# Patient Record
Sex: Female | Born: 1960 | Race: White | Hispanic: No | Marital: Married | Smoking: Former smoker
Health system: Southern US, Community
[De-identification: ages and names within clinical notes are randomized; demographics above are authoritative.]

## PROBLEM LIST (undated history)

## (undated) DIAGNOSIS — J449 Chronic obstructive pulmonary disease, unspecified: Secondary | ICD-10-CM

## (undated) DIAGNOSIS — E7800 Pure hypercholesterolemia, unspecified: Secondary | ICD-10-CM

## (undated) DIAGNOSIS — C801 Malignant (primary) neoplasm, unspecified: Secondary | ICD-10-CM

## (undated) DIAGNOSIS — K5792 Diverticulitis of intestine, part unspecified, without perforation or abscess without bleeding: Secondary | ICD-10-CM

## (undated) DIAGNOSIS — Z9889 Other specified postprocedural states: Secondary | ICD-10-CM

## (undated) DIAGNOSIS — F32A Depression, unspecified: Secondary | ICD-10-CM

## (undated) DIAGNOSIS — M5136 Other intervertebral disc degeneration, lumbar region: Secondary | ICD-10-CM

## (undated) DIAGNOSIS — R112 Nausea with vomiting, unspecified: Secondary | ICD-10-CM

## (undated) DIAGNOSIS — Z9981 Dependence on supplemental oxygen: Secondary | ICD-10-CM

## (undated) DIAGNOSIS — F419 Anxiety disorder, unspecified: Secondary | ICD-10-CM

## (undated) DIAGNOSIS — R011 Cardiac murmur, unspecified: Secondary | ICD-10-CM

## (undated) DIAGNOSIS — K219 Gastro-esophageal reflux disease without esophagitis: Secondary | ICD-10-CM

## (undated) DIAGNOSIS — I10 Essential (primary) hypertension: Secondary | ICD-10-CM

## (undated) DIAGNOSIS — M51369 Other intervertebral disc degeneration, lumbar region without mention of lumbar back pain or lower extremity pain: Secondary | ICD-10-CM

## (undated) DIAGNOSIS — Z8701 Personal history of pneumonia (recurrent): Secondary | ICD-10-CM

## (undated) HISTORY — PX: ABDOMINAL HYSTERECTOMY: SHX81

---

## 2008-01-14 ENCOUNTER — Inpatient Hospital Stay (HOSPITAL_COMMUNITY): Admission: EM | Admit: 2008-01-14 | Discharge: 2008-01-16 | Payer: Self-pay | Admitting: Emergency Medicine

## 2010-12-05 ENCOUNTER — Other Ambulatory Visit (HOSPITAL_COMMUNITY): Payer: Self-pay | Admitting: Family Medicine

## 2010-12-05 DIAGNOSIS — R0989 Other specified symptoms and signs involving the circulatory and respiratory systems: Secondary | ICD-10-CM

## 2010-12-08 ENCOUNTER — Ambulatory Visit (HOSPITAL_COMMUNITY)
Admission: RE | Admit: 2010-12-08 | Discharge: 2010-12-08 | Disposition: A | Discharge status: Home / Self Care | Payer: Self-pay | Source: Ambulatory Visit | Attending: Family Medicine | Admitting: Family Medicine

## 2010-12-08 DIAGNOSIS — R109 Unspecified abdominal pain: Secondary | ICD-10-CM | POA: Insufficient documentation

## 2010-12-08 DIAGNOSIS — R0989 Other specified symptoms and signs involving the circulatory and respiratory systems: Secondary | ICD-10-CM | POA: Insufficient documentation

## 2011-01-02 NOTE — H&P (Signed)
NAMEEVELY, GAINEY               ACCOUNT NO.:  [id number]   MEDICAL RECORD NO.:  [id number]          PATIENT TYPE:  INP   LOCATION:  A337                          FACILITY:  [facility]   PHYSICIAN:  Margaretmary Dys, M.D.DATE OF BIRTH:  July 25, 1961   DATE OF ADMISSION:  01/14/2008  DATE OF DISCHARGE:  LH                              HISTORY & PHYSICAL   PRIMARY CARE PHYSICIAN:  Dr. Freida Busman [facility].   ADMISSION DIAGNOSES:  1. Acute exacerbation of chronic obstructive pulmonary disease.  2. Prior undiagnosed emphysema/chronic obstructive pulmonary disease.  3. Hypokalemia.  4. Nicotine abuse with up 2 packs a day for an approximate 80 pack      year history.   CHIEF COMPLAINT:  Increasing cough and shortness of breath of 2 days  duration.   HISTORY OF PRESENT ILLNESS:  Kathleen Bennett is a 50 year old female who  presented to the emergency room today with complaint of cough.  The  patient reports some cough productive of yellowish sputum and occasional  greenish sputum.  She says she may have had some low-grade fever,  although she did not really check.  The patient says she has had  difficulty getting up to work because of shortness of breath and her flu-  like symptoms which she describes.  The patient also says she has been  having a lot of wheezing.  She reports some pain mostly pruritic chest  pain which is 3/10.  Apparently may be aggravated by severe cough.   The patient was told in the past that she may have chronic obstructive  pulmonary disease, but has never really been treated for it.  She has  had occasional nebulization treatments in the emergency room, but has  not had any outpatient therapy.  Evaluation here in the emergency room,  the patient was noted to be wheezing and had rhonchi.  She was also  hypoxic with desaturations low at 79% while she was walking.  Due to  hypoxia, the patient is now being admitted for further evaluation and  management for  what appears to be acute exacerbation of undiagnosed and  untreated COPD.   REVIEW OF SYSTEMS:  She denies any weight loss.  She has no hemoptysis.  No nausea or vomiting.  No diarrhea.  No abdominal pain.  She does have  generalized muscle aches.   PAST MEDICAL HISTORY:  1. Asthma.  2. Hypertension.  3. Allergies.  4. History of recurrent bronchitis in the past.  5. History of depression.  6. History of anxiety.   PAST SURGICAL HISTORY:  Status post hysterectomy.   MEDICATIONS:  1. Prozac 20 mg p.o. once a day.  2. Atenolol 25 mg p.o. once a day.  3. Buspirone 10 mg p.o. t.i.d.  4. Loratadine 10 mg p.o. once a day.  5. Ceron DM syrup 1 teaspoonful 4 times a day.   ALLERGIES:  CODEINE with itching.   SOCIAL HISTORY:  The patient is married and lives with her husband.  They have 3 children.  She continues to smoke about 2 packs of  cigarettes a day  for the past 40 years.  She denies any illicit drug  use.  The patient currently works at the [employer] at [employer].  The patient drinks occasionally.  Denies any dependency  issues.   FAMILY HISTORY:  Positive for hypertension, diabetes, coronary artery  disease.   PHYSICAL EXAMINATION:  GENERAL:  The patient was conscious, alert,  comfortable, although in some mild respiratory distress and having  occasional bouts of cough.  VITAL SIGNS:  Blood pressure 118/78, pulse 85, respirations 20,  temperature 100.4 degrees Fahrenheit.  Oxygen saturation was 93% on room  air.  HEENT:  Normocephalic, atraumatic.  Oral mucosa was moist with no  exudates.  NECK:  Supple.  No JVD or lymphadenopathy.  LUNGS:  Bilaterally diffuse rhonchi and wheezing.  HEART:  S1 and S2 regular.  No S3, S4, gallops or rubs.  ABDOMEN:  Soft, nontender.  Bowel sounds were positive.  No mass was  palpable.  EXTREMITIES:  No pitting pedal edema.  No calf induration or tenderness  was noted.  CNS:  Grossly intact with no focal  neurological deficits.   LABORATORY DATA:  Arterial blood gas shows a pH of 7.480, PCO2 was  steady at 0.6, PO2 was 54.2 with bicarbonate of 28.4, oxygen saturation  was 87%.  This was on 2 liters nasal cannula.  White blood cell count  was 11.5, hemoglobin 13.7, hematocrit 38, platelet count 199,  neutrophils of 82%, sodium 132, potassium 2.9, chloride 95, CO2 29,  glucose 141, BUN 12, creatinine 0.79, AST 33, ALT 23, albumin 3.6,  calcium 9.3.  Urinalysis only showed a small amount of blood.   DIAGNOSTICS:  Chest x-ray showed evidence of COPD and chronic bronchitic  changes.   ASSESSMENT:  This is a 50 year old female who presented with increasing  cough, shortness of breath and increased sputum production over the last  2-3 days.  The patient has what appears to be undiagnosed chronic  obstructive pulmonary disease in light of previous symptoms and heavy  use of nicotine.   IMPRESSION:  1. Hypoxic respiratory failure.  2. Acute chronic obstructive pulmonary disease exacerbation.  3. History of anxiety and depression.  4. Moderate hypokalemia.   PLAN:  1. We will admit patient to the medical floor.  2. We will initiate therapy with levofloxacin, Solu-Medrol and also      nebulizations around the clock.  3. The patient may require BiPAP therapy depending on her response,      but she does appear to mostly just be hypoxic and she did have      significant improvement when she was placed on oxygen therapy.  4. I will replace her potassium in addition to what has been done in      the emergency room.  5.  DVT prophylaxis will be with Lovenox.  5. GI prophylaxis will be Protonix.  6. I have informed patient that she will need to establish care with      the pulmonologist for further evaluation of her COPD.  7. She will also need pulmonary function tests.  8. I will resume all her prior home medications at this time.   CODE STATUS:  The patient is a full code.  I have informed her  the  potential for her symptoms to worsen and that she might need mechanical  ventilation.   I have explained the above and she verbalized full understanding.  There  were no family members at this time to discuss her  current status.      Margaretmary Dys, M.D.  Electronically Signed     AM/MEDQ  D:  01/14/2008  T:  01/14/2008  Job:  [id number]

## 2011-01-02 NOTE — Discharge Summary (Signed)
NAMESHYASIA, FUNCHES               ACCOUNT NO.:  [id number]   MEDICAL RECORD NO.:  [id number]          PATIENT TYPE:  INP   LOCATION:  A337                          FACILITY:  [facility]   PHYSICIAN:  Kathleen Singh, Kathleen Bennett    DATE OF BIRTH:  02/17/1961   DATE OF ADMISSION:  01/13/2008  DATE OF DISCHARGE:  05/29/2009LH                               DISCHARGE SUMMARY   her   ADMISSION DIAGNOSES:  1. Acute exacerbation of chronic obstructive pulmonary disease.  2. Prior undiagnosed emphysema of chronic obstructive pulmonary      disease.  3. Hypokalemia.  4. Nicotine use, with up to 2 packs a day for an approximately 80 pack-      year history.   DISCHARGING DIAGNOSES:  1. Acute exacerbation of chronic obstructive pulmonary disease,      resolved.  2. Hypokalemia, resolved.  3. Nicotine abuse.   PRIMARY CARE PHYSICIAN:  Kathleen Bennett. Kathleen Bennett  is her physician.   H&P was done by Kathleen Bennett.  Please refer.  To summarize, the patient  is a 49 year old female who presented with a history of shortness of  breath and was diagnosed with new-onset COPD.  She was admitted the  service of [facility].  She was started on levofloxacin, Solu-Medrol, as  well as nebulizers around the clock.  They replaced her potassium and  did DVT and GI prophylaxis.  The patient was educated completely on her  COPD.  We have set her up with home healthcare for a home nebulizer as  well as home O2.  She was assessed for her oxygenation level and found  to qualify for continuous home oxygen, so we will place her on that.  While she was here, we went ahead and talked with her about establishing  with a pulmonologist for further evaluation.  The patient stated  understanding.  She also has stated that she will try to stop smoking at  this point in time.   She will be sent home on her following medications that she was on  previously:  1. Atenolol/chlorothiazide 100/25, 1 p.o. daily.  2.  Fluoxetine 20 mg 1 p.o. at bedtime.  3. Buspirone 10 mg t.i.d. p.r.n.  4. Ceron-DM cough suppressant q.i.d. p.r.n.  5. Loratadine 10 mg daily.   NEW PRESCRIPTIONS:  1. Xopenex 1.25 mg nebulizers q.i.d. p.r.n.  2. Prednisone 10 mg, with specialized dosing of 60 mg x2 days, 50 mg      x2 days, 40 mg x2 days, 30 mg x2 days, 20 mg x2 days, and 10 mg x2      days, then stop.  3. We will also send her home on is a Z-PAK, and to use as directed.   There was some concern as to whether or not the patient can afford to  pay for her prescriptions, so we will have case management talk to her  and see what the health Bennett can Kathleen Bennett for her as well as what our  services can offer her.   CONDITION:  Stable.   DISPOSITION:  Will be to home.  She has a appointment with Kathleen Bennett office on January 24, 2008 to follow  up regarding any out of work issues and for hospital followup at 8 a.m.  Also, we will set her up an appointment with Dr. Juanetta Gosling' office in a  couple weeks so she can follow up with him if her primary care physician  feels that it is appropriate.  She has been told return if symptoms  worsen, and to definitely stop smoking.      Kathleen Singh, Kathleen Bennett  Electronically Signed     CB/MEDQ  D:  01/16/2008  T:  01/16/2008  Job:  [id number]   cc:   [facility]  Fax: [phone]

## 2011-05-16 LAB — COMPREHENSIVE METABOLIC PANEL
AST: 33
Albumin: 3.6
Alkaline Phosphatase: 49
Chloride: 95 — ABNORMAL LOW
Creatinine, Ser: 0.79
GFR calc Af Amer: >60
Potassium: 2.9 — ABNORMAL LOW
Total Bilirubin: 0.6

## 2011-05-16 LAB — BLOOD GAS, ARTERIAL
Acid-Base Excess: 1.8
Acid-Base Excess: 4.9 — ABNORMAL HIGH
O2 Content: 2
O2 Saturation: 87.1
Patient temperature: 37
TCO2: 22.6
TCO2: 24.7
pCO2 arterial: 37.4
pH, Arterial: 7.448 — ABNORMAL HIGH
pO2, Arterial: 62.8 — ABNORMAL LOW

## 2011-05-16 LAB — URINALYSIS, ROUTINE W REFLEX MICROSCOPIC
Bilirubin Urine: NEGATIVE
Glucose, UA: NEGATIVE
Ketones, ur: NEGATIVE
Nitrite: NEGATIVE
pH: 7

## 2011-05-16 LAB — DIFFERENTIAL
Basophils Absolute: 0
Basophils Absolute: 0
Basophils Relative: 0
Eosinophils Absolute: 0
Eosinophils Relative: 0
Eosinophils Relative: 0
Lymphocytes Relative: 10 — ABNORMAL LOW
Lymphocytes Relative: 9 — ABNORMAL LOW
Lymphs Abs: 1.5
Monocytes Absolute: 0.3
Monocytes Absolute: 1.1 — ABNORMAL HIGH
Monocytes Relative: 2 — ABNORMAL LOW
Monocytes Relative: 9
Neutro Abs: 12.6 — ABNORMAL HIGH
Neutrophils Relative %: 88 — ABNORMAL HIGH

## 2011-05-16 LAB — CBC
HCT: 37
Platelets: 199
Platelets: 244
RDW: 14.5
WBC: 11.5 — ABNORMAL HIGH
WBC: 14.4 — ABNORMAL HIGH

## 2011-05-16 LAB — URINE CULTURE
Colony Count: NO GROWTH
Culture: NO GROWTH

## 2011-05-16 LAB — BASIC METABOLIC PANEL WITH GFR
BUN: 12
CO2: 27
Calcium: 9.5
Chloride: 100
Creatinine, Ser: 0.81
GFR calc non Af Amer: >60
Glucose, Bld: 270 — ABNORMAL HIGH
Potassium: 3.9
Sodium: 139

## 2012-04-04 ENCOUNTER — Encounter (HOSPITAL_COMMUNITY): Payer: Self-pay

## 2012-04-04 ENCOUNTER — Emergency Department (HOSPITAL_COMMUNITY): Payer: No Typology Code available for payment source

## 2012-04-04 ENCOUNTER — Emergency Department (HOSPITAL_COMMUNITY)
Admission: EM | Admit: 2012-04-04 | Discharge: 2012-04-04 | Disposition: A | Discharge status: Home / Self Care | Payer: No Typology Code available for payment source | Attending: Emergency Medicine | Admitting: Emergency Medicine

## 2012-04-04 DIAGNOSIS — J449 Chronic obstructive pulmonary disease, unspecified: Secondary | ICD-10-CM | POA: Insufficient documentation

## 2012-04-04 DIAGNOSIS — S0990XA Unspecified injury of head, initial encounter: Secondary | ICD-10-CM | POA: Insufficient documentation

## 2012-04-04 DIAGNOSIS — S8010XA Contusion of unspecified lower leg, initial encounter: Secondary | ICD-10-CM | POA: Insufficient documentation

## 2012-04-04 DIAGNOSIS — F411 Generalized anxiety disorder: Secondary | ICD-10-CM | POA: Insufficient documentation

## 2012-04-04 DIAGNOSIS — R079 Chest pain, unspecified: Secondary | ICD-10-CM | POA: Insufficient documentation

## 2012-04-04 DIAGNOSIS — I10 Essential (primary) hypertension: Secondary | ICD-10-CM | POA: Insufficient documentation

## 2012-04-04 DIAGNOSIS — J4489 Other specified chronic obstructive pulmonary disease: Secondary | ICD-10-CM | POA: Insufficient documentation

## 2012-04-04 DIAGNOSIS — K219 Gastro-esophageal reflux disease without esophagitis: Secondary | ICD-10-CM | POA: Insufficient documentation

## 2012-04-04 DIAGNOSIS — M542 Cervicalgia: Secondary | ICD-10-CM | POA: Insufficient documentation

## 2012-04-04 DIAGNOSIS — F17200 Nicotine dependence, unspecified, uncomplicated: Secondary | ICD-10-CM | POA: Insufficient documentation

## 2012-04-04 DIAGNOSIS — IMO0002 Reserved for concepts with insufficient information to code with codable children: Secondary | ICD-10-CM | POA: Insufficient documentation

## 2012-04-04 DIAGNOSIS — S20219A Contusion of unspecified front wall of thorax, initial encounter: Secondary | ICD-10-CM | POA: Insufficient documentation

## 2012-04-04 DIAGNOSIS — S8012XA Contusion of left lower leg, initial encounter: Secondary | ICD-10-CM

## 2012-04-04 DIAGNOSIS — M79609 Pain in unspecified limb: Secondary | ICD-10-CM | POA: Insufficient documentation

## 2012-04-04 HISTORY — DX: Anxiety disorder, unspecified: F41.9

## 2012-04-04 HISTORY — DX: Essential (primary) hypertension: I10

## 2012-04-04 HISTORY — DX: Chronic obstructive pulmonary disease, unspecified: J44.9

## 2012-04-04 HISTORY — DX: Gastro-esophageal reflux disease without esophagitis: K21.9

## 2012-04-04 MED ORDER — HYDROCODONE-ACETAMINOPHEN 5-325 MG PO TABS: 1.0000 | ORAL_TABLET | ORAL | Status: AC | PRN

## 2012-04-04 MED ORDER — HYDROCODONE-ACETAMINOPHEN 5-325 MG PO TABS
1.0000 | ORAL_TABLET | Freq: Once | ORAL | Status: AC
  Administered 2012-04-04: 1 via ORAL
  Filled 2012-04-04: qty 1
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
## 2012-04-04 NOTE — ED Notes (Signed)
Pt aware of xrays

## 2013-11-21 ENCOUNTER — Emergency Department (HOSPITAL_COMMUNITY): Payer: Medicaid Other

## 2013-11-21 ENCOUNTER — Encounter (HOSPITAL_COMMUNITY): Payer: Self-pay | Admitting: Emergency Medicine

## 2013-11-21 ENCOUNTER — Inpatient Hospital Stay (HOSPITAL_COMMUNITY)
Admission: EM | Admit: 2013-11-21 | Discharge: 2013-11-24 | DRG: 392 | Disposition: A | Discharge status: Home / Self Care | Payer: Medicaid Other | Attending: Internal Medicine | Admitting: Internal Medicine

## 2013-11-21 DIAGNOSIS — K5792 Diverticulitis of intestine, part unspecified, without perforation or abscess without bleeding: Secondary | ICD-10-CM

## 2013-11-21 DIAGNOSIS — F411 Generalized anxiety disorder: Secondary | ICD-10-CM | POA: Diagnosis present

## 2013-11-21 DIAGNOSIS — E860 Dehydration: Secondary | ICD-10-CM | POA: Diagnosis present

## 2013-11-21 DIAGNOSIS — I10 Essential (primary) hypertension: Secondary | ICD-10-CM | POA: Diagnosis present

## 2013-11-21 DIAGNOSIS — K529 Noninfective gastroenteritis and colitis, unspecified: Secondary | ICD-10-CM

## 2013-11-21 DIAGNOSIS — K5732 Diverticulitis of large intestine without perforation or abscess without bleeding: Principal | ICD-10-CM

## 2013-11-21 DIAGNOSIS — J4489 Other specified chronic obstructive pulmonary disease: Secondary | ICD-10-CM | POA: Diagnosis present

## 2013-11-21 DIAGNOSIS — E876 Hypokalemia: Secondary | ICD-10-CM | POA: Diagnosis present

## 2013-11-21 DIAGNOSIS — D72829 Elevated white blood cell count, unspecified: Secondary | ICD-10-CM | POA: Diagnosis present

## 2013-11-21 DIAGNOSIS — J449 Chronic obstructive pulmonary disease, unspecified: Secondary | ICD-10-CM | POA: Diagnosis present

## 2013-11-21 DIAGNOSIS — N179 Acute kidney failure, unspecified: Secondary | ICD-10-CM | POA: Diagnosis present

## 2013-11-21 DIAGNOSIS — E7800 Pure hypercholesterolemia, unspecified: Secondary | ICD-10-CM | POA: Diagnosis present

## 2013-11-21 DIAGNOSIS — K219 Gastro-esophageal reflux disease without esophagitis: Secondary | ICD-10-CM | POA: Diagnosis present

## 2013-11-21 DIAGNOSIS — M5137 Other intervertebral disc degeneration, lumbosacral region: Secondary | ICD-10-CM | POA: Diagnosis present

## 2013-11-21 DIAGNOSIS — M51379 Other intervertebral disc degeneration, lumbosacral region without mention of lumbar back pain or lower extremity pain: Secondary | ICD-10-CM | POA: Diagnosis present

## 2013-11-21 DIAGNOSIS — F17200 Nicotine dependence, unspecified, uncomplicated: Secondary | ICD-10-CM | POA: Diagnosis present

## 2013-11-21 DIAGNOSIS — Z23 Encounter for immunization: Secondary | ICD-10-CM

## 2013-11-21 HISTORY — DX: Other intervertebral disc degeneration, lumbar region without mention of lumbar back pain or lower extremity pain: M51.369

## 2013-11-21 HISTORY — DX: Pure hypercholesterolemia, unspecified: E78.00

## 2013-11-21 HISTORY — DX: Other intervertebral disc degeneration, lumbar region: M51.36

## 2013-11-21 LAB — MAGNESIUM: Magnesium: 1.9 mg/dL (ref 1.5–2.5)

## 2013-11-21 LAB — COMPREHENSIVE METABOLIC PANEL
ALBUMIN: 4.1 g/dL (ref 3.5–5.2)
ALK PHOS: 83 U/L (ref 39–117)
ALT: 11 U/L (ref 0–35)
AST: 15 U/L (ref 0–37)
BUN: 12 mg/dL (ref 6–23)
CO2: 30 mEq/L (ref 19–32)
Calcium: 10.6 mg/dL — ABNORMAL HIGH (ref 8.4–10.5)
Chloride: 87 mEq/L — ABNORMAL LOW (ref 96–112)
Creatinine, Ser: 1.23 mg/dL — ABNORMAL HIGH (ref 0.50–1.10)
GFR calc Af Amer: 57 mL/min — ABNORMAL LOW (ref >90)
GFR calc non Af Amer: 49 mL/min — ABNORMAL LOW (ref >90)
Glucose, Bld: 143 mg/dL — ABNORMAL HIGH (ref 70–99)
POTASSIUM: 2.9 meq/L — AB (ref 3.7–5.3)
Sodium: 134 mEq/L — ABNORMAL LOW (ref 137–147)
Total Bilirubin: 1.1 mg/dL (ref 0.3–1.2)
Total Protein: 8.9 g/dL — ABNORMAL HIGH (ref 6.0–8.3)

## 2013-11-21 LAB — CBC WITH DIFFERENTIAL/PLATELET
BASOS PCT: 0 % (ref 0–1)
Basophils Absolute: 0 10*3/uL (ref 0.0–0.1)
Eosinophils Absolute: 0 10*3/uL (ref 0.0–0.7)
Eosinophils Relative: 0 % (ref 0–5)
HCT: 46.2 % — ABNORMAL HIGH (ref 36.0–46.0)
HEMOGLOBIN: 16.1 g/dL — AB (ref 12.0–15.0)
LYMPHS ABS: 1.3 10*3/uL (ref 0.7–4.0)
Lymphocytes Relative: 9 % — ABNORMAL LOW (ref 12–46)
MCH: 35.2 pg — ABNORMAL HIGH (ref 26.0–34.0)
MCHC: 34.8 g/dL (ref 30.0–36.0)
MCV: 100.9 fL — ABNORMAL HIGH (ref 78.0–100.0)
Monocytes Absolute: 0.8 10*3/uL (ref 0.1–1.0)
Monocytes Relative: 6 % (ref 3–12)
NEUTROS ABS: 12 10*3/uL — AB (ref 1.7–7.7)
NEUTROS PCT: 85 % — AB (ref 43–77)
Platelets: 202 10*3/uL (ref 150–400)
RBC: 4.58 MIL/uL (ref 3.87–5.11)
RDW: 13.4 % (ref 11.5–15.5)
WBC: 14 10*3/uL — ABNORMAL HIGH (ref 4.0–10.5)

## 2013-11-21 LAB — URINE MICROSCOPIC-ADD ON

## 2013-11-21 LAB — URINALYSIS, ROUTINE W REFLEX MICROSCOPIC
GLUCOSE, UA: 100 mg/dL — AB
Leukocytes, UA: NEGATIVE
Nitrite: NEGATIVE
PH: 7 (ref 5.0–8.0)
Protein, ur: 100 mg/dL — AB
SPECIFIC GRAVITY, URINE: 1.01 (ref 1.005–1.030)
Urobilinogen, UA: 4 mg/dL — ABNORMAL HIGH (ref 0.0–1.0)

## 2013-11-21 LAB — LIPASE, BLOOD: Lipase: 17 U/L (ref 11–59)

## 2013-11-21 MED ORDER — POTASSIUM CHLORIDE 10 MEQ/100ML IV SOLN
10.0000 meq | Freq: Once | INTRAVENOUS | Status: AC
  Administered 2013-11-21: 10 meq via INTRAVENOUS
  Filled 2013-11-21: qty 100

## 2013-11-21 MED ORDER — ATENOLOL-CHLORTHALIDONE 100-25 MG PO TABS: 1.0000 | ORAL_TABLET | ORAL | Status: DC

## 2013-11-21 MED ORDER — ALBUTEROL SULFATE HFA 108 (90 BASE) MCG/ACT IN AERS: 2.0000 | INHALATION_SPRAY | Freq: Four times a day (QID) | RESPIRATORY_TRACT | Status: DC | PRN

## 2013-11-21 MED ORDER — BUSPIRONE HCL 5 MG PO TABS
10.0000 mg | ORAL_TABLET | Freq: Two times a day (BID) | ORAL | Status: DC
  Administered 2013-11-21 – 2013-11-23 (×5): 10 mg via ORAL
  Filled 2013-11-21 (×5): qty 2

## 2013-11-21 MED ORDER — CHLORTHALIDONE 25 MG PO TABS
25.0000 mg | ORAL_TABLET | Freq: Every day | ORAL | Status: DC
  Administered 2013-11-22: 25 mg via ORAL
  Filled 2013-11-21 (×2): qty 1

## 2013-11-21 MED ORDER — MORPHINE SULFATE 2 MG/ML IJ SOLN: 1.0000 mg | INTRAMUSCULAR | Status: DC | PRN

## 2013-11-21 MED ORDER — ENOXAPARIN SODIUM 40 MG/0.4ML ~~LOC~~ SOLN
40.0000 mg | SUBCUTANEOUS | Status: DC
  Administered 2013-11-21 – 2013-11-23 (×3): 40 mg via SUBCUTANEOUS
  Filled 2013-11-21 (×3): qty 0.4

## 2013-11-21 MED ORDER — METRONIDAZOLE IN NACL 5-0.79 MG/ML-% IV SOLN
500.0000 mg | Freq: Once | INTRAVENOUS | Status: AC
  Administered 2013-11-21: 500 mg via INTRAVENOUS
  Filled 2013-11-21: qty 100

## 2013-11-21 MED ORDER — IOHEXOL 300 MG/ML  SOLN
50.0000 mL | Freq: Once | INTRAMUSCULAR | Status: AC | PRN
  Administered 2013-11-21: 50 mL via ORAL

## 2013-11-21 MED ORDER — METRONIDAZOLE IN NACL 5-0.79 MG/ML-% IV SOLN
500.0000 mg | Freq: Three times a day (TID) | INTRAVENOUS | Status: DC
  Administered 2013-11-22 – 2013-11-23 (×5): 500 mg via INTRAVENOUS
  Filled 2013-11-21 (×5): qty 100

## 2013-11-21 MED ORDER — SODIUM CHLORIDE 0.9 % IV SOLN
INTRAVENOUS | Status: DC
  Administered 2013-11-21: 14:00:00 via INTRAVENOUS

## 2013-11-21 MED ORDER — ONDANSETRON HCL 4 MG/2ML IJ SOLN: 4.0000 mg | Freq: Four times a day (QID) | INTRAMUSCULAR | Status: DC | PRN

## 2013-11-21 MED ORDER — PNEUMOCOCCAL VAC POLYVALENT 25 MCG/0.5ML IJ INJ
0.5000 mL | INJECTION | INTRAMUSCULAR | Status: AC
  Administered 2013-11-22: 0.5 mL via INTRAMUSCULAR
  Filled 2013-11-21: qty 0.5

## 2013-11-21 MED ORDER — FAMOTIDINE IN NACL 20-0.9 MG/50ML-% IV SOLN
20.0000 mg | Freq: Once | INTRAVENOUS | Status: AC
  Administered 2013-11-21: 20 mg via INTRAVENOUS
  Filled 2013-11-21: qty 50

## 2013-11-21 MED ORDER — SODIUM CHLORIDE 0.9 % IV SOLN
INTRAVENOUS | Status: DC
Start: 2013-11-21 — End: 2013-11-23
  Administered 2013-11-22 – 2013-11-23 (×2): via INTRAVENOUS

## 2013-11-21 MED ORDER — IOHEXOL 300 MG/ML  SOLN
100.0000 mL | Freq: Once | INTRAMUSCULAR | Status: AC | PRN
  Administered 2013-11-21: 100 mL via INTRAVENOUS

## 2013-11-21 MED ORDER — CIPROFLOXACIN IN D5W 400 MG/200ML IV SOLN
400.0000 mg | Freq: Once | INTRAVENOUS | Status: AC
  Administered 2013-11-21: 400 mg via INTRAVENOUS
  Filled 2013-11-21: qty 200

## 2013-11-21 MED ORDER — OXYCODONE HCL 5 MG PO TABS
5.0000 mg | ORAL_TABLET | ORAL | Status: DC | PRN
  Administered 2013-11-21 – 2013-11-23 (×6): 5 mg via ORAL
  Filled 2013-11-21 (×6): qty 1

## 2013-11-21 MED ORDER — SODIUM CHLORIDE 0.9 % IV BOLUS (SEPSIS)
500.0000 mL | Freq: Once | INTRAVENOUS | Status: AC
  Administered 2013-11-21: 1000 mL via INTRAVENOUS

## 2013-11-21 MED ORDER — ATENOLOL 25 MG PO TABS
100.0000 mg | ORAL_TABLET | Freq: Every day | ORAL | Status: DC
  Administered 2013-11-22: 100 mg via ORAL
  Filled 2013-11-21: qty 4

## 2013-11-21 MED ORDER — ONDANSETRON HCL 4 MG PO TABS: 4.0000 mg | ORAL_TABLET | Freq: Four times a day (QID) | ORAL | Status: DC | PRN

## 2013-11-21 MED ORDER — MORPHINE SULFATE 4 MG/ML IJ SOLN
4.0000 mg | INTRAMUSCULAR | Status: AC | PRN
  Administered 2013-11-21 (×2): 4 mg via INTRAVENOUS
  Filled 2013-11-21 (×2): qty 1

## 2013-11-21 MED ORDER — ALBUTEROL SULFATE (2.5 MG/3ML) 0.083% IN NEBU: 2.5000 mg | INHALATION_SOLUTION | Freq: Four times a day (QID) | RESPIRATORY_TRACT | Status: DC | PRN

## 2013-11-21 MED ORDER — CIPROFLOXACIN IN D5W 400 MG/200ML IV SOLN
400.0000 mg | Freq: Two times a day (BID) | INTRAVENOUS | Status: DC
  Administered 2013-11-22 – 2013-11-23 (×3): 400 mg via INTRAVENOUS
  Filled 2013-11-21 (×3): qty 200

## 2013-11-21 MED ORDER — ONDANSETRON HCL 4 MG/2ML IJ SOLN
4.0000 mg | INTRAMUSCULAR | Status: AC | PRN
  Administered 2013-11-21 (×2): 4 mg via INTRAVENOUS
  Filled 2013-11-21 (×2): qty 2

## 2013-11-21 MED ORDER — POTASSIUM CHLORIDE CRYS ER 20 MEQ PO TBCR
40.0000 meq | EXTENDED_RELEASE_TABLET | Freq: Two times a day (BID) | ORAL | Status: DC
  Administered 2013-11-21: 40 meq via ORAL
  Filled 2013-11-21 (×2): qty 2

## 2013-11-21 NOTE — ED Notes (Signed)
CRITICAL VALUE ALERT  Critical value received:  Potassium 2.9 Date of notification:  11/21/2013  Time of notification:  13:51  Critical value read back: yes  Nurse: Juliette AlcideMelinda RN   MD notified (1st page):  Dr Clarene DukeMcmanus Time of first page:  13:51  MD notified (2nd page):  Time of second page:  Responding MD: Dr Clarene DukeMcmanus  Time MD responded:  13:51

## 2013-11-21 NOTE — ED Notes (Signed)
Pt states that her pain is starting to return again,

## 2013-11-21 NOTE — ED Notes (Signed)
Pt c/o generalized abdominal pain along with n/v/d since Thursday. Pt describes pain as "sharp and cramping".

## 2013-11-21 NOTE — H&P (Signed)
[facility] History and Physical  Kathleen Bennett D Melcher ZOX:[id number] DOB: 09/30/1960 DOA: 11/21/2013  Referring physician: ED physician he is known as the and continued pallor is is alive note no abscess PCP: No primary provider on file.   Chief Complaint: Abdominal pain  HPI:  Patient is 53 year old female who presents to emergency department with main concern of progressively worsening generalized abdominal pain, throbbing and cramping, intermittent in nature, 5/10 in severity, associated with multiple episodes of nonbloody vomiting and nonbloody diarrhea. This started 2 days prior to this admission. Patient also reports associated subjective fevers and chills, poor oral intake. She denies chest pain and shortness of breath, no blood in stool. Patient also denies weight loss, no other systemic symptoms such as night sweats.no recent sick contacts or exposures, no similar events in the past.  In emergency department, patient found to be hemodynamically stable, CT abdomen and pelvis significant for acute diverticulitis. Patient started on IV antibiotics and [facility] hospitalist asked to admit for further management.  Assessment and Plan: Active Problems:   Acute diverticulitis - will admit patient to medical floor - Agree with ciprofloxacin and Flagyl IV for now - Since patient is asking to drink some fluids, I think it is okay to place on clear liquid diet for now and advance as patient tolerating - CT abdomen and pelvis notable for acute diverticulitis with adjacent small fluid collection that could represent diverticulum versus small abscess - Would not think that patient requires urgent surgical intervention at this time, ED doctor consulted surgery for further input so we'll followup on their recommendations   Acute renal failure - Likely pre-renal in etiology and secondary to vomiting and diarrhea, poor oral intake, dehydration as evident by mild degree of hemoconcentration - Will place  on IV fluids and repeat BMP in the morning   Hypokalemia - Again this is most likely secondary to vomiting and diarrhea - Will check magnesium level and will supplement potassium - Repeat BMP in the morning   Leukocytosis - Secondary to principal problem - Antibiotics as noted above and repeat CBC in the morning  Radiological Exams on Admission: Dg Chest 2 View  11/21/2013   No active cardiopulmonary disease.     Ct Abdomen Pelvis W Contrast   11/21/2013    Lower sigmoid acute diverticulitis within the pelvis. Small adjacent sigmoid 24 x 21 mm air-fluid collection could represent a giant diverticulum versus a small adjacent developing abscess. This is not amenable to CT-guided drainage.  Adjacent pelvic free fluid from the inflammatory process.  Distal ileum wall thickening adjacent to the diverticulitis, suspect secondary enteritis of the ileum with an associated functional small bowel obstruction.  No free air or pneumatosis.     Code Status: Full Family Communication: Pt at bedside Disposition Plan: Admit for further evaluation     Review of Systems:  Constitutional:Negative for diaphoresis.  HENT: Negative for hearing loss, ear pain, nosebleeds, congestion, sore throat, neck pain, tinnitus and ear discharge.   Eyes: Negative for blurred vision, double vision, photophobia, pain, discharge and redness.  Respiratory: Negative for cough, hemoptysis, sputum production, shortness of breath, wheezing and stridor.   Cardiovascular: Negative for chest pain, palpitations, orthopnea, claudication and leg swelling.  Gastrointestinal: Per history of present illness Genitourinary: Negative for dysuria, urgency, frequency, hematuria and flank pain.  Musculoskeletal: Negative for myalgias, back pain, joint pain and falls.  Skin: Negative for itching and rash.  Neurological: Negative for tingling, tremors, sensory change, speech change, focal weakness, loss  of consciousness and headaches.   Endo/Heme/Allergies: Negative for environmental allergies and polydipsia. Does not bruise/bleed easily.  Psychiatric/Behavioral: Negative for suicidal ideas. The patient is not nervous/anxious.      Past Medical History  Diagnosis Date  . COPD (chronic obstructive pulmonary disease)   . Hypertension   . Anxiety   . GERD (gastroesophageal reflux disease)   . DDD (degenerative disc disease), lumbar   . High cholesterol     Past Surgical History  Procedure Laterality Date  . Abdominal hysterectomy      Social History:  reports that she has been smoking.  She does not have any smokeless tobacco history on file. She reports that she drinks alcohol. She reports that she does not use illicit drugs.  Allergies  Allergen Reactions  . Codeine Itching and Other (See Comments)    REACTION: Severe itching, feels like skin is crawling    History reviewed. No pertinent family history.  Prior to Admission medications   Medication Sig Start Date End Date Taking? Authorizing Provider  atenolol-chlorthalidone (TENORETIC) 100-25 MG per tablet Take 1 tablet by mouth every morning.   Yes Historical Provider, MD  Cyanocobalamin (VITAMIN B 12 PO) Take 500 mg by mouth once.   Yes Historical Provider, MD  albuterol (PROVENTIL HFA;VENTOLIN HFA) 108 (90 BASE) MCG/ACT inhaler Inhale 2 puffs into the lungs every 6 (six) hours as needed.    Historical Provider, MD  albuterol (PROVENTIL) (2.5 MG/3ML) 0.083% nebulizer solution Take 2.5 mg by nebulization every 6 (six) hours as needed.    Historical Provider, MD  busPIRone (BUSPAR) 10 MG tablet Take 10 mg by mouth 2 (two) times daily.     Historical Provider, MD    Physical Exam: Filed Vitals:   11/21/13 1230 11/21/13 1504 11/21/13 1720  BP: 132/95 164/94 134/88  Pulse: 99 77 74  Temp: 97.7 F (36.5 C)    TempSrc: Oral    Resp: 20  16  SpO2: 99% 96% 97%    Physical Exam  Constitutional: Appears well-developed and well-nourished. No distress.   HENT: Normocephalic. External right and left ear normal. Oropharynx is clear and moist.  Eyes: Conjunctivae and EOM are normal. PERRLA, no scleral icterus.  Neck: Normal ROM. Neck supple. No JVD. No tracheal deviation. No thyromegaly.  CVS: RRR, S1/S2 +, no murmurs, no gallops, no carotid bruit.  Pulmonary: Effort and breath sounds normal, no stridor, rhonchi, wheezes, rales.  Abdominal: Soft. BS +,  no distension, tenderness in epigastric area, no rebound or guarding.  Musculoskeletal: Normal range of motion. No edema and no tenderness.  Lymphadenopathy: No lymphadenopathy noted, cervical, inguinal. Neuro: Alert. Normal reflexes, muscle tone coordination. No cranial nerve deficit. Skin: Skin is warm and dry. No rash noted. Not diaphoretic. No erythema. No pallor.  Psychiatric: Normal mood and affect. Behavior, judgment, thought content normal.   Labs on Admission:  Basic Metabolic Panel:  Recent Labs Lab 11/21/13 1305  NA 134*  K 2.9*  CL 87*  CO2 30  GLUCOSE 143*  BUN 12  CREATININE 1.23*  CALCIUM 10.6*   Liver Function Tests:  Recent Labs Lab 11/21/13 1305  AST 15  ALT 11  ALKPHOS 83  BILITOT 1.1  PROT 8.9*  ALBUMIN 4.1    Recent Labs Lab 11/21/13 1305  LIPASE 17   CBC:  Recent Labs Lab 11/21/13 1305  WBC 14.0*  NEUTROABS 12.0*  HGB 16.1*  HCT 46.2*  MCV 100.9*  PLT 202   EKG: Normal sinus rhythm, no ST/T  wave changes  Debbora Presto, MD  [facility] Pager [phone]  If 7PM-7AM, please contact night-coverage [email or web address] Password [facility] 11/21/2013, 5:47 PM

## 2013-11-21 NOTE — ED Notes (Signed)
Pt c/o n/v/d, generalized abd pain, chills, fever that started a few days ago,

## 2013-11-21 NOTE — ED Notes (Signed)
Report given to OlivetMiranda, RN on 300

## 2013-11-21 NOTE — ED Notes (Signed)
Pt has finished drinking contrast for ct scan, Ct notified, pt given additional warm blanket, comfort measures provided, pt and family updated on plan of care,

## 2013-11-21 NOTE — ED Notes (Signed)
Dr Izola PriceMyers at bedside,

## 2013-11-21 NOTE — ED Notes (Signed)
Kathleen Bennett spouse [phone],

## 2013-11-21 NOTE — ED Provider Notes (Signed)
CSN: [id number]     Arrival date & time 11/21/13  1222 History   First MD Initiated Contact with Patient 11/21/13 1313     Chief Complaint  Patient presents with  . Abdominal Pain      HPI Pt was seen at 1325. Per pt, c/o gradual onset and persistence of multiple intermittent episodes of N/V/D that began 2 days ago.   Describes the stools as "watery." Has been associated with generalized "cramping" abd pain, as well as chills and generalized body aches/fatigue. Denies CP/SOB, no back pain, no fevers, no black or blood in stools or emesis.     Past Medical History  Diagnosis Date  . COPD (chronic obstructive pulmonary disease)   . Hypertension   . Anxiety   . GERD (gastroesophageal reflux disease)    Past Surgical History  Procedure Laterality Date  . Abdominal hysterectomy      History  Substance Use Topics  . Smoking status: Current Every Day Smoker  . Smokeless tobacco: Not on file  . Alcohol Use: Yes    Review of Systems ROS: Statement: All systems negative except as marked or noted in the HPI; Constitutional: Negative for fever and +chills, generalized body aches/fatigue.. ; ; Eyes: Negative for eye pain, redness and discharge. ; ; ENMT: Negative for ear pain, hoarseness, nasal congestion, sinus pressure and sore throat. ; ; Cardiovascular: Negative for chest pain, palpitations, diaphoresis, dyspnea and peripheral edema. ; ; Respiratory: Negative for cough, wheezing and stridor. ; ; Gastrointestinal: +N/V/D, abd pain. Negative for blood in stool, hematemesis, jaundice and rectal bleeding. . ; ; Genitourinary: Negative for dysuria, flank pain and hematuria. ; ; Musculoskeletal: Negative for back pain and neck pain. Negative for swelling and trauma.; ; Skin: Negative for pruritus, rash, abrasions, blisters, bruising and skin lesion.; ; Neuro: Negative for headache, lightheadedness and neck stiffness. Negative for weakness, altered level of consciousness , altered mental status,  extremity weakness, paresthesias, involuntary movement, seizure and syncope.        Allergies  Codeine  Home Medications   Current Outpatient Rx  Name  Route  Sig  Dispense  Refill  . albuterol (PROVENTIL HFA;VENTOLIN HFA) 108 (90 BASE) MCG/ACT inhaler   Inhalation   Inhale 2 puffs into the lungs every 6 (six) hours as needed.         Marland Kitchen albuterol (PROVENTIL) (2.5 MG/3ML) 0.083% nebulizer solution   Nebulization   Take 2.5 mg by nebulization every 6 (six) hours as needed.         Marland Kitchen atenolol-chlorthalidone (TENORETIC) 100-25 MG per tablet   Oral   Take 1 tablet by mouth every morning.         . busPIRone (BUSPAR) 10 MG tablet   Oral   Take 10 mg by mouth 3 (three) times daily.         Marland Kitchen ibuprofen (ADVIL,MOTRIN) 200 MG tablet   Oral   Take 200 mg by mouth every 6 (six) hours as needed.         . pravastatin (PRAVACHOL) 20 MG tablet   Oral   Take 20 mg by mouth every morning.         . ranitidine (ZANTAC) 150 MG tablet   Oral   Take 300 mg by mouth every morning.          BP 132/95  Pulse 99  Temp(Src) 97.7 F (36.5 C) (Oral)  Resp 20  SpO2 99% Physical Exam 1330: Physical examination:  Nursing notes reviewed;  Vital signs and O2 SAT reviewed;  Constitutional: Well developed, Well nourished, Well hydrated, In no acute distress; Head:  Normocephalic, atraumatic; Eyes: EOMI, PERRL, No scleral icterus; ENMT: Mouth and pharynx normal, Mucous membranes moist; Neck: Supple, Full range of motion, No lymphadenopathy; Cardiovascular: Regular rate and rhythm, No murmur, rub, or gallop; Respiratory: Breath sounds clear & equal bilaterally, No wheezes.  Speaking full sentences with ease, Normal respiratory effort/excursion; Chest: Nontender, Movement normal; Abdomen: Soft, +mid-epigastric, LUQ and LLQ tender to palp. No rebound or guarding. Nondistended, Normal bowel sounds; Genitourinary: No CVA tenderness; Extremities: Pulses normal, No tenderness, No edema, No calf  edema or asymmetry.; Neuro: AA&Ox3, Major CN grossly intact.  Speech clear. No gross focal motor or sensory deficits in extremities.; Skin: Color normal, Warm, Dry.   ED Course  Procedures     EKG Interpretation None      MDM  MDM Reviewed: nursing note, previous chart and vitals Reviewed previous: labs Interpretation: labs, x-ray and CT scan    Results for orders placed during the hospital encounter of 11/21/13  CBC WITH DIFFERENTIAL      Result Value Ref Range   WBC 14.0 (*) 4.0 - 10.5 K/uL   RBC 4.58  3.87 - 5.11 MIL/uL   Hemoglobin 16.1 (*) 12.0 - 15.0 g/dL   HCT 91.446.2 (*) 78.236.0 - 95.646.0 %   MCV 100.9 (*) 78.0 - 100.0 fL   MCH 35.2 (*) 26.0 - 34.0 pg   MCHC 34.8  30.0 - 36.0 g/dL   RDW 21.313.4  08.611.5 - 57.815.5 %   Platelets 202  150 - 400 K/uL   Neutrophils Relative % 85 (*) 43 - 77 %   Neutro Abs 12.0 (*) 1.7 - 7.7 K/uL   Lymphocytes Relative 9 (*) 12 - 46 %   Lymphs Abs 1.3  0.7 - 4.0 K/uL   Monocytes Relative 6  3 - 12 %   Monocytes Absolute 0.8  0.1 - 1.0 K/uL   Eosinophils Relative 0  0 - 5 %   Eosinophils Absolute 0.0  0.0 - 0.7 K/uL   Basophils Relative 0  0 - 1 %   Basophils Absolute 0.0  0.0 - 0.1 K/uL  COMPREHENSIVE METABOLIC PANEL      Result Value Ref Range   Sodium 134 (*) 137 - 147 mEq/L   Potassium 2.9 (*) 3.7 - 5.3 mEq/L   Chloride 87 (*) 96 - 112 mEq/L   CO2 30  19 - 32 mEq/L   Glucose, Bld 143 (*) 70 - 99 mg/dL   BUN 12  6 - 23 mg/dL   Creatinine, Ser 4.691.23 (*) 0.50 - 1.10 mg/dL   Calcium 62.910.6 (*) 8.4 - 10.5 mg/dL   Total Protein 8.9 (*) 6.0 - 8.3 g/dL   Albumin 4.1  3.5 - 5.2 g/dL   AST 15  0 - 37 U/L   ALT 11  0 - 35 U/L   Alkaline Phosphatase 83  39 - 117 U/L   Total Bilirubin 1.1  0.3 - 1.2 mg/dL   GFR calc non Af Amer 49 (*) >90 mL/min   GFR calc Af Amer 57 (*) >90 mL/min  LIPASE, BLOOD      Result Value Ref Range   Lipase 17  11 - 59 U/L  URINALYSIS, ROUTINE W REFLEX MICROSCOPIC      Result Value Ref Range   Color, Urine RED (*) YELLOW    APPearance CLOUDY (*) CLEAR   Specific Gravity, Urine 1.010  1.005 - 1.030   pH 7.0  5.0 - 8.0   Glucose, UA 100 (*) NEGATIVE mg/dL   Hgb urine dipstick MODERATE (*) NEGATIVE   Bilirubin Urine MODERATE (*) NEGATIVE   Ketones, ur TRACE (*) NEGATIVE mg/dL   Protein, ur 161 (*) NEGATIVE mg/dL   Urobilinogen, UA 4.0 (*) 0.0 - 1.0 mg/dL   Nitrite NEGATIVE  NEGATIVE   Leukocytes, UA NEGATIVE  NEGATIVE  URINE MICROSCOPIC-ADD ON      Result Value Ref Range   Squamous Epithelial / LPF FEW (*) RARE   WBC, UA 3-6  <3 WBC/hpf   RBC / HPF 11-20  <3 RBC/hpf   Bacteria, UA FEW (*) RARE   Casts HYALINE CASTS (*) NEGATIVE   Dg Chest 2 View 11/21/2013   CLINICAL DATA:  Cough, congestion  EXAM: CHEST  2 VIEW  COMPARISON:  04/04/2012  FINDINGS: Cardiomediastinal silhouette is stable. No acute infiltrate or pleural effusion. No pulmonary edema. Bony thorax is unremarkable.  IMPRESSION: No active cardiopulmonary disease.   Electronically Signed   By: Natasha Mead M.D.   On: 11/21/2013 15:00   Ct Abdomen Pelvis W Contrast 11/21/2013   CLINICAL DATA:  Mid abdominal pain, nausea and vomiting  EXAM: CT ABDOMEN AND PELVIS WITH CONTRAST  TECHNIQUE: Multidetector CT imaging of the abdomen and pelvis was performed using the standard protocol following bolus administration of intravenous contrast.  CONTRAST:  50mL OMNIPAQUE IOHEXOL 300 MG/ML SOLN, OMNIPAQUE IOHEXOL 300 MG/ML SOLN  COMPARISON:  12/08/2010 ultrasound  FINDINGS: Clear lung bases. Normal heart size. No pericardial or pleural effusion. No hiatal hernia.  Abdomen: Liver, gallbladder, biliary system, pancreas, spleen, adrenal glands, and kidneys are within normal limits for age and demonstrate no acute process.  Atherosclerosis of the aorta and iliac vessels without aneurysm or occlusive process.  No abdominal free fluid, fluid collection, hemorrhage, abscess or significant adenopathy.  Several loops of dilated jejunum and ileum noted throughout the lower  abdomen and pelvis. No associated pneumatosis. No free air.  Pelvis: The lower sigmoid colon demonstrates diverticular changes, surrounding inflammation and a small amount of pelvic free fluid compatible with sigmoid diverticulitis. A small adjacent sigmoid air-fluid collection is noted measures 24 x 21 mm. This could represent a small interloop abscess or giant diverticulum. Adjacent distal ileum appears collapsed with slight wall thickening in the pelvis, image 66. Suspect this is related to a secondary inflammatory enteritis with an associated functional small bowel obstruction from the sigmoid diverticulitis.  Urinary bladder is collapsed. No pelvic adenopathy, inguinal abnormality, or hernia.  Degenerative changes noted of the spine. Healed left inferior ramus fracture noted.  IMPRESSION: Lower sigmoid acute diverticulitis within the pelvis. Small adjacent sigmoid 24 x 21 mm air-fluid collection could represent a giant diverticulum versus a small adjacent developing abscess. This is not amenable to CT-guided drainage.  Adjacent pelvic free fluid from the inflammatory process.  Distal ileum wall thickening adjacent to the diverticulitis, suspect secondary enteritis of the ileum with an associated functional small bowel obstruction.  No free air or pneumatosis.  These results were called by telephone at the time of interpretation on 11/21/2013 at 3:04 PM to Dr. Samuel Jester , who verbally acknowledged these results.   Electronically Signed   By: Ruel Favors M.D.   On: 11/21/2013 15:12    1530:  Will start IV abx. Potassium repleted IV.  Dx and testing d/w pt and family.  Questions answered.  Verb understanding, agreeable to admit.  T/C to General  Surgery Dr. Lovell Sheehan, case discussed, including:  HPI, pertinent PM/SHx, VS/PE, dx testing, ED course and treatment:  Agreeable to consult, requests to continue IV abx, admit to medicine service.   1650:  T/C to [facility] Dr. Izola Price, case discussed, including:  HPI,  pertinent PM/SHx, VS/PE, dx testing, ED course and treatment:  Agreeable to admit, requests to write temporary orders, obtain medical bed to team 1.    Laray Anger, DO 11/23/13 1240

## 2013-11-21 NOTE — ED Notes (Signed)
Pt ambulatory to restroom, tolerated well,  

## 2013-11-21 NOTE — ED Notes (Signed)
Pt notified family of admission and room numbner

## 2013-11-21 NOTE — ED Notes (Signed)
Given ginger ale per request,

## 2013-11-22 DIAGNOSIS — Z23 Encounter for immunization: Secondary | ICD-10-CM | POA: Diagnosis not present

## 2013-11-22 DIAGNOSIS — J449 Chronic obstructive pulmonary disease, unspecified: Secondary | ICD-10-CM | POA: Diagnosis present

## 2013-11-22 DIAGNOSIS — K5732 Diverticulitis of large intestine without perforation or abscess without bleeding: Secondary | ICD-10-CM | POA: Diagnosis present

## 2013-11-22 DIAGNOSIS — F411 Generalized anxiety disorder: Secondary | ICD-10-CM | POA: Diagnosis present

## 2013-11-22 DIAGNOSIS — I10 Essential (primary) hypertension: Secondary | ICD-10-CM | POA: Diagnosis present

## 2013-11-22 DIAGNOSIS — D72829 Elevated white blood cell count, unspecified: Secondary | ICD-10-CM | POA: Diagnosis present

## 2013-11-22 DIAGNOSIS — E7800 Pure hypercholesterolemia, unspecified: Secondary | ICD-10-CM | POA: Diagnosis present

## 2013-11-22 DIAGNOSIS — N179 Acute kidney failure, unspecified: Secondary | ICD-10-CM | POA: Diagnosis present

## 2013-11-22 DIAGNOSIS — F17200 Nicotine dependence, unspecified, uncomplicated: Secondary | ICD-10-CM | POA: Diagnosis present

## 2013-11-22 DIAGNOSIS — E860 Dehydration: Secondary | ICD-10-CM | POA: Diagnosis present

## 2013-11-22 DIAGNOSIS — E876 Hypokalemia: Secondary | ICD-10-CM | POA: Diagnosis present

## 2013-11-22 DIAGNOSIS — R1084 Generalized abdominal pain: Secondary | ICD-10-CM | POA: Diagnosis present

## 2013-11-22 DIAGNOSIS — M5137 Other intervertebral disc degeneration, lumbosacral region: Secondary | ICD-10-CM | POA: Diagnosis present

## 2013-11-22 DIAGNOSIS — K219 Gastro-esophageal reflux disease without esophagitis: Secondary | ICD-10-CM | POA: Diagnosis present

## 2013-11-22 LAB — BASIC METABOLIC PANEL
BUN: 7 mg/dL (ref 6–23)
CALCIUM: 8.5 mg/dL (ref 8.4–10.5)
CO2: 28 mEq/L (ref 19–32)
Chloride: 93 mEq/L — ABNORMAL LOW (ref 96–112)
Creatinine, Ser: 0.75 mg/dL (ref 0.50–1.10)
GFR calc Af Amer: >90 mL/min (ref >90)
GLUCOSE: 133 mg/dL — AB (ref 70–99)
POTASSIUM: 3 meq/L — AB (ref 3.7–5.3)
SODIUM: 133 meq/L — AB (ref 137–147)

## 2013-11-22 LAB — CBC
HCT: 37 % (ref 36.0–46.0)
HEMOGLOBIN: 12.6 g/dL (ref 12.0–15.0)
MCH: 34.8 pg — AB (ref 26.0–34.0)
MCHC: 34.1 g/dL (ref 30.0–36.0)
MCV: 102.2 fL — ABNORMAL HIGH (ref 78.0–100.0)
Platelets: 174 10*3/uL (ref 150–400)
RBC: 3.62 MIL/uL — AB (ref 3.87–5.11)
RDW: 13.4 % (ref 11.5–15.5)
WBC: 10.1 10*3/uL (ref 4.0–10.5)

## 2013-11-22 MED ORDER — POTASSIUM CHLORIDE CRYS ER 20 MEQ PO TBCR
40.0000 meq | EXTENDED_RELEASE_TABLET | Freq: Two times a day (BID) | ORAL | Status: AC
  Administered 2013-11-22 – 2013-11-23 (×3): 40 meq via ORAL
  Filled 2013-11-22 (×2): qty 2

## 2013-11-22 NOTE — Progress Notes (Addendum)
Patient ID: Kathleen Bennett, female   DOB: 1961/01/19, 53 y.o.   MRN: [id number] [facility] PROGRESS NOTE  Kathleen Bennett:[id number] DOB: [birth year] DOA: 11/21/2013 PCP: No primary provider on file.  Brief narrative: Patient is 53 year old female who presents to emergency department with main concern of progressively worsening generalized abdominal pain, throbbing and cramping, intermittent in nature, 5/10 in severity, associated with multiple episodes of nonbloody vomiting and nonbloody diarrhea. This started 2 days prior to this admission. Patient also reports associated subjective fevers and chills, poor oral intake. She denies chest pain and shortness of breath, no blood in stool. Patient also denies weight loss, no other systemic symptoms such as night sweats.no recent sick contacts or exposures, no similar events in the past.  In emergency department, patient found to be hemodynamically stable, CT abdomen and pelvis significant for acute diverticulitis. Patient started on IV antibiotics and [facility] hospitalist asked to admit for further management.  \ Assessment and Plan:  Active Problems:  Acute diverticulitis  - pt clinically improving and tolerating clear liquid diet well - WBC is trending down and pt afebrile over the past 24 hours - continue Cipro and Flagyl day #2/14 - no indication for surgical intervention at this time, will cancel surgery consultation, appreciate concern  - advance diet as pt able to tolerate, continue analgesia and antiemetics as needed  Acute renal failure  - Likely pre-renal in etiology and secondary to vomiting and diarrhea, poor oral intake, dehydration - IVF provided and pt has responded well, Cr is now WNL  - repeat BMP in AM Hypokalemia  - Again this is most likely secondary to vomiting and diarrhea  - MG is WNL, continue to supplement potassium - repeat BMP in AM Leukocytosis  - Secondary to principal problem  - WBC in now WNL  HTN - hold  chlorthalidone due to soft BP  Consultants:  None  Procedures/Studies: CXR  11/21/2013   No active cardiopulmonary disease.     CT Abd Pelvis W Contrast  11/21/2013  Lower sigmoid acute diverticulitis within the pelvis. Small adjacent sigmoid 24 x 21 mm air-fluid collection, ? diverticulum versus a small adjacent developing abscess, not amenable to CT-guided drainage, suspect secondary enteritis of the ileum with an associated functional SBO. Antibiotics:  Ciprofloxacin 4/4 -->  Flagyl 4/5 -->   Code Status: Full Family Communication: Pt at bedside Disposition Plan: Home when medically stable  HPI/Subjective: No events overnight.   Objective: Filed Vitals:   11/21/13 1720 11/21/13 1815 11/21/13 2018 11/22/13 0421  BP: 134/88 112/58 129/77 97/58  Pulse: 74 70 75 71  Temp:  97.6 F (36.4 C) 97.6 F (36.4 C) 98.9 F (37.2 C)  TempSrc:  Oral Oral Oral  Resp: 16 18 20 20   Height:  5\' 3"  (1.6 m)    Weight:  72.8 kg (160 lb 7.9 oz)    SpO2: 97% 100% 97% 94%    Intake/Output Summary (Last 24 hours) at 11/22/13 1120 Last data filed at 11/21/13 1930  Gross per 24 hour  Intake    240 ml  Output      0 ml  Net    240 ml    Exam:   General:  Pt is alert, follows commands appropriately, not in acute distress  Cardiovascular: Regular rate and rhythm, S1/S2, no murmurs, no rubs, no gallops  Respiratory: Clear to auscultation bilaterally, no wheezing, no crackles, no rhonchi  Abdomen: Soft, tender in epigastric area, non distended, bowel sounds present, no  guarding  Extremities: No edema, pulses DP and PT palpable bilaterally  Neuro: Grossly nonfocal  Data Reviewed: Basic Metabolic Panel:  Recent Labs Lab 11/21/13 1305 11/22/13 0543  NA 134* 133*  K 2.9* 3.0*  CL 87* 93*  CO2 30 28  GLUCOSE 143* 133*  BUN 12 7  CREATININE 1.23* 0.75  CALCIUM 10.6* 8.5  MG 1.9  --    Liver Function Tests:  Recent Labs Lab 11/21/13 1305  AST 15  ALT 11  ALKPHOS 83   BILITOT 1.1  PROT 8.9*  ALBUMIN 4.1    Recent Labs Lab 11/21/13 1305  LIPASE 17   CBC:  Recent Labs Lab 11/21/13 1305 11/22/13 0543  WBC 14.0* 10.1  NEUTROABS 12.0*  --   HGB 16.1* 12.6  HCT 46.2* 37.0  MCV 100.9* 102.2*  PLT 202 174   Scheduled Meds: . atenolol  100 mg Oral Daily   And  . chlorthalidone  25 mg Oral Daily  . busPIRone  10 mg Oral BID  . ciprofloxacin  400 mg Intravenous Q12H  . enoxaparin (LOVENOX) injection  40 mg Subcutaneous Q24H  . metronidazole  500 mg Intravenous Q8H  . pneumococcal 23 valent vaccine  0.5 mL Intramuscular Tomorrow-1000  . potassium chloride  40 mEq Oral BID   Continuous Infusions: . sodium chloride 75 mL/hr at 11/22/13 0506   Debbora PrestoMAGICK-Vianca Bracher, MD  [facility] Pager [phone]  If 7PM-7AM, please contact night-coverage [email or web address] Password [email or web address] 11/22/2013, 11:20 AM   LOS: 1 day

## 2013-11-23 DIAGNOSIS — K5289 Other specified noninfective gastroenteritis and colitis: Secondary | ICD-10-CM

## 2013-11-23 LAB — CBC
HEMATOCRIT: 36.3 % (ref 36.0–46.0)
Hemoglobin: 12.3 g/dL (ref 12.0–15.0)
MCH: 34.5 pg — ABNORMAL HIGH (ref 26.0–34.0)
MCHC: 33.9 g/dL (ref 30.0–36.0)
MCV: 101.7 fL — ABNORMAL HIGH (ref 78.0–100.0)
Platelets: 190 10*3/uL (ref 150–400)
RBC: 3.57 MIL/uL — ABNORMAL LOW (ref 3.87–5.11)
RDW: 13.3 % (ref 11.5–15.5)
WBC: 9.1 10*3/uL (ref 4.0–10.5)

## 2013-11-23 LAB — BASIC METABOLIC PANEL
BUN: 4 mg/dL — AB (ref 6–23)
CHLORIDE: 93 meq/L — AB (ref 96–112)
CO2: 29 meq/L (ref 19–32)
Calcium: 9 mg/dL (ref 8.4–10.5)
Creatinine, Ser: 0.68 mg/dL (ref 0.50–1.10)
GFR calc non Af Amer: >90 mL/min (ref >90)
Glucose, Bld: 132 mg/dL — ABNORMAL HIGH (ref 70–99)
Potassium: 3.4 mEq/L — ABNORMAL LOW (ref 3.7–5.3)
Sodium: 134 mEq/L — ABNORMAL LOW (ref 137–147)

## 2013-11-23 MED ORDER — POTASSIUM CHLORIDE CRYS ER 20 MEQ PO TBCR
40.0000 meq | EXTENDED_RELEASE_TABLET | Freq: Once | ORAL | Status: AC
  Administered 2013-11-23: 40 meq via ORAL
  Filled 2013-11-23: qty 4

## 2013-11-23 MED ORDER — CIPROFLOXACIN HCL 500 MG PO TABS: 500.0000 mg | ORAL_TABLET | Freq: Two times a day (BID) | ORAL | Status: DC

## 2013-11-23 MED ORDER — POTASSIUM CHLORIDE CRYS ER 20 MEQ PO TBCR: 20.0000 meq | EXTENDED_RELEASE_TABLET | Freq: Every day | ORAL | Status: DC

## 2013-11-23 MED ORDER — ONDANSETRON HCL 4 MG PO TABS: 4.0000 mg | ORAL_TABLET | Freq: Four times a day (QID) | ORAL | Status: DC | PRN

## 2013-11-23 MED ORDER — OXYCODONE HCL 5 MG PO TABS: 5.0000 mg | ORAL_TABLET | ORAL | Status: DC | PRN

## 2013-11-23 MED ORDER — METRONIDAZOLE 500 MG PO TABS: 500.0000 mg | ORAL_TABLET | Freq: Three times a day (TID) | ORAL | Status: DC

## 2013-11-23 MED ORDER — CIPROFLOXACIN HCL 250 MG PO TABS
500.0000 mg | ORAL_TABLET | Freq: Two times a day (BID) | ORAL | Status: DC
  Administered 2013-11-23 – 2013-11-24 (×3): 500 mg via ORAL
  Filled 2013-11-23 (×3): qty 2

## 2013-11-23 MED ORDER — METRONIDAZOLE 500 MG PO TABS
500.0000 mg | ORAL_TABLET | Freq: Three times a day (TID) | ORAL | Status: DC
  Administered 2013-11-23 – 2013-11-24 (×3): 500 mg via ORAL
  Filled 2013-11-23 (×3): qty 1

## 2013-11-23 NOTE — Progress Notes (Signed)
Patient ID: Kathleen Bennett, female   DOB: 08-03-1961, 53 y.o.   MRN: [id number]  [facility] PROGRESS NOTE  Kathleen Bennett:[id number] DOB: 08/03/1961 DOA: 11/21/2013 PCP: No primary provider on file.  Brief narrative:  Patient is 53 year old female who presents to emergency department with main concern of progressively worsening generalized abdominal pain, throbbing and cramping, intermittent in nature, 5/10 in severity, associated with multiple episodes of nonbloody vomiting and nonbloody diarrhea. This started 2 days prior to this admission. Patient also reports associated subjective fevers and chills, poor oral intake. She denies chest pain and shortness of breath, no blood in stool. Patient also denies weight loss, no other systemic symptoms such as night sweats.no recent sick contacts or exposures, no similar events in the past.  In emergency department, patient found to be hemodynamically stable, CT abdomen and pelvis significant for acute diverticulitis. Patient started on IV antibiotics and [facility] hospitalist asked to admit for further management.  \  Assessment and Plan:  Active Problems:  Acute diverticulitis  - pt clinically improving and tolerating clear liquid diet well  - WBC is trending down and pt afebrile over the past 24 hours  - continue Cipro and Flagyl day #3/14  - no indication for surgical intervention at this time, will cancel surgery consultation, appreciate concern  - advance diet as pt able to tolerate, continue analgesia and antiemetics as needed  Acute renal failure  - Likely pre-renal in etiology and secondary to vomiting and diarrhea, poor oral intake, dehydration  - IVF provided and pt has responded well, Cr is now WNL  - repeat BMP in AM  Hypokalemia  - Again this is most likely secondary to vomiting and diarrhea  - Mg is WNL, continue to supplement potassium  - repeat BMP in AM  Leukocytosis  - Secondary to principal problem  - WBC in now WNL  HTN   - continue to hold chlorthalidone due to soft BP   Consultants:  None Procedures/Studies:  CXR 11/21/2013 No active cardiopulmonary disease.  CT Abd Pelvis W Contrast 11/21/2013 Lower sigmoid acute diverticulitis within the pelvis. Small adjacent sigmoid 24 x 21 mm air-fluid collection, ? diverticulum versus a small adjacent developing abscess, not amenable to CT-guided drainage, suspect secondary enteritis of the ileum with an associated functional SBO. Antibiotics:  Ciprofloxacin 4/4 -->  Flagyl 4/5 -->   Code Status: Full  Family Communication: Pt at bedside  Disposition Plan: Home when medically stable  HPI/Subjective: No events overnight.   Objective: Filed Vitals:   11/22/13 0421 11/22/13 1454 11/22/13 2017 11/23/13 0408  BP: 97/58 91/61 103/67 118/73  Pulse: 71 75 80 79  Temp: 98.9 F (37.2 C) 98.4 F (36.9 C) 98.8 F (37.1 C) 98.9 F (37.2 C)  TempSrc: Oral Oral Oral Oral  Resp: 20 20 20 20   Height:      Weight:      SpO2: 94% 94% 94% 93%    Intake/Output Summary (Last 24 hours) at 11/23/13 0904 Last data filed at 11/22/13 1930  Gross per 24 hour  Intake 1713.75 ml  Output      0 ml  Net 1713.75 ml    Exam:   General:  Pt is alert, follows commands appropriately, not in acute distress  Cardiovascular: Regular rate and rhythm, S1/S2, no murmurs, no rubs, no gallops  Respiratory: Clear to auscultation bilaterally, no wheezing, no crackles, no rhonchi  Abdomen: Soft, non tender, non distended, bowel sounds present, no guarding  Extremities: No edema,  pulses DP and PT palpable bilaterally  Neuro: Grossly nonfocal  Data Reviewed: Basic Metabolic Panel:  Recent Labs Lab 11/21/13 1305 11/22/13 0543 11/23/13 0636  NA 134* 133* 134*  K 2.9* 3.0* 3.4*  CL 87* 93* 93*  CO2 30 28 29   GLUCOSE 143* 133* 132*  BUN 12 7 4*  CREATININE 1.23* 0.75 0.68  CALCIUM 10.6* 8.5 9.0  MG 1.9  --   --    Liver Function Tests:  Recent Labs Lab 11/21/13 1305   AST 15  ALT 11  ALKPHOS 83  BILITOT 1.1  PROT 8.9*  ALBUMIN 4.1    Recent Labs Lab 11/21/13 1305  LIPASE 17   CBC:  Recent Labs Lab 11/21/13 1305 11/22/13 0543 11/23/13 0636  WBC 14.0* 10.1 9.1  NEUTROABS 12.0*  --   --   HGB 16.1* 12.6 12.3  HCT 46.2* 37.0 36.3  MCV 100.9* 102.2* 101.7*  PLT 202 174 190    Scheduled Meds: . busPIRone  10 mg Oral BID  . ciprofloxacin  400 mg Intravenous Q12H  . enoxaparin (LOVENOX) injection  40 mg Subcutaneous Q24H  . metronidazole  500 mg Intravenous Q8H  . potassium chloride  40 mEq Oral BID   Continuous Infusions: . sodium chloride 50 mL/hr at 11/23/13 [id number]     Debbora PrestoMAGICK-Laylani Pudwill, MD  [facility] Pager [phone]  If 7PM-7AM, please contact night-coverage [email or web address] Password [email or web address] 11/23/2013, 9:04 AM   LOS: 2 days

## 2013-11-23 NOTE — Care Management Note (Signed)
    Page 1 of 1   11/23/2013     2:44:22 PM   CARE MANAGEMENT NOTE 11/23/2013  Patient:  Caro LarocheALALAK,Jaslen D   Account Number:  [id number]  Date Initiated:  11/23/2013  Documentation initiated by:  Rosemary HolmsOBSON,Ric Rosenberg  Subjective/Objective Assessment:   Pt lives at home with spouse. She does not have a PCP nor insurance. Assistance with PCP requested     Action/Plan:   Anticipated DC Date:  11/23/2013   Anticipated DC Plan:  HOME/SELF CARE      DC Planning Services  CM consult      Choice offered to / List presented to:             Status of service:   Medicare Important Message given?   (If response is "NO", the following Medicare IM given date fields will be blank) Date Medicare IM given:   Date Additional Medicare IM given:    Discharge Disposition:    Per UR Regulation:    If discussed at Long Length of Stay Meetings, dates discussed:    Comments:  11/23/13 Rosemary HolmsAmy Deina Lipsey RN BSN CM Appt at [employer] set up for 4/14 15 at 10:00am

## 2013-11-23 NOTE — Discharge Instructions (Signed)
Diverticulitis °A diverticulum is a small pouch or sac on the colon. Diverticulosis is the presence of these diverticula on the colon. Diverticulitis is the irritation (inflammation) or infection of diverticula. °CAUSES  °The colon and its diverticula contain bacteria. If food particles block the tiny opening to a diverticulum, the bacteria inside can grow and cause an increase in pressure. This leads to infection and inflammation and is called diverticulitis. °SYMPTOMS  °· Abdominal pain and tenderness. Usually, the pain is located on the left side of your abdomen. However, it could be located elsewhere. °· Fever. °· Bloating. °· Feeling sick to your stomach (nausea). °· Throwing up (vomiting). °· Abnormal stools. °DIAGNOSIS  °Your caregiver will take a history and perform a physical exam. Since many things can cause abdominal pain, other tests may be necessary. Tests may include: °· Blood tests. °· Urine tests. °· X-ray of the abdomen. °· CT scan of the abdomen. °Sometimes, surgery is needed to determine if diverticulitis or other conditions are causing your symptoms. °TREATMENT  °Most of the time, you can be treated without surgery. Treatment includes: °· Resting the bowels by only having liquids for a few days. As you improve, you will need to eat a low-fiber diet. °· Intravenous (IV) fluids if you are losing body fluids (dehydrated). °· Antibiotic medicines that treat infections may be given. °· Pain and nausea medicine, if needed. °· Surgery if the inflamed diverticulum has burst. °HOME CARE INSTRUCTIONS  °· Try a clear liquid diet (broth, tea, or water for as long as directed by your caregiver). You may then gradually begin a low-fiber diet as tolerated.  °A low-fiber diet is a diet with less than 10 grams of fiber. Choose the foods below to reduce fiber in the diet: °· White breads, cereals, rice, and pasta. °· Cooked fruits and vegetables or soft fresh fruits and vegetables without the skin. °· Ground or  well-cooked tender beef, ham, veal, lamb, pork, or poultry. °· Eggs and seafood. °· After your diverticulitis symptoms have improved, your caregiver may put you on a high-fiber diet. A high-fiber diet includes 14 grams of fiber for every 1000 calories consumed. For a standard 2000 calorie diet, you would need 28 grams of fiber. Follow these diet guidelines to help you increase the fiber in your diet. It is important to slowly increase the amount fiber in your diet to avoid gas, constipation, and bloating. °· Choose whole-grain breads, cereals, pasta, and brown rice. °· Choose fresh fruits and vegetables with the skin on. Do not overcook vegetables because the more vegetables are cooked, the more fiber is lost. °· Choose more nuts, seeds, legumes, dried peas, beans, and lentils. °· Look for food products that have greater than 3 grams of fiber per serving on the Nutrition Facts label. °· Take all medicine as directed by your caregiver. °· If your caregiver has given you a follow-up appointment, it is very important that you go. Not going could result in lasting (chronic) or permanent injury, pain, and disability. If there is any problem keeping the appointment, call to reschedule. °SEEK MEDICAL CARE IF:  °· Your pain does not improve. °· You have a hard time advancing your diet beyond clear liquids. °· Your bowel movements do not return to normal. °SEEK IMMEDIATE MEDICAL CARE IF:  °· Your pain becomes worse. °· You have an oral temperature above 102° F (38.9° C), not controlled by medicine. °· You have repeated vomiting. °· You have bloody or black, tarry stools. °·   Symptoms that brought you to your caregiver become worse or are not getting better. °MAKE SURE YOU:  °· Understand these instructions. °· Will watch your condition. °· Will get help right away if you are not doing well or get worse. °Document Released: 05/16/2005 Document Revised: 10/29/2011 Document Reviewed: 09/11/2010 °[employer]® Patient Information  ©2014 [employer]. ° °

## 2013-11-24 LAB — BASIC METABOLIC PANEL
BUN: 4 mg/dL — AB (ref 6–23)
CO2: 29 mEq/L (ref 19–32)
CREATININE: 0.74 mg/dL (ref 0.50–1.10)
Calcium: 9.6 mg/dL (ref 8.4–10.5)
Chloride: 93 mEq/L — ABNORMAL LOW (ref 96–112)
GFR calc Af Amer: >90 mL/min (ref >90)
GLUCOSE: 97 mg/dL (ref 70–99)
POTASSIUM: 4.2 meq/L (ref 3.7–5.3)
Sodium: 135 mEq/L — ABNORMAL LOW (ref 137–147)

## 2013-11-24 LAB — CBC
HEMATOCRIT: 37.2 % (ref 36.0–46.0)
HEMOGLOBIN: 12.5 g/dL (ref 12.0–15.0)
MCH: 33.9 pg (ref 26.0–34.0)
MCHC: 33.6 g/dL (ref 30.0–36.0)
MCV: 100.8 fL — ABNORMAL HIGH (ref 78.0–100.0)
Platelets: 219 10*3/uL (ref 150–400)
RBC: 3.69 MIL/uL — ABNORMAL LOW (ref 3.87–5.11)
RDW: 13.3 % (ref 11.5–15.5)
WBC: 10.5 10*3/uL (ref 4.0–10.5)

## 2013-11-24 NOTE — Discharge Summary (Signed)
Physician Discharge Summary  Kathleen Bennett WUJ:[id number] DOB: 01/08/1961 DOA: 11/21/2013  PCP: No primary provider on file.  Admit date: 11/21/2013 Discharge date: 11/24/2013  Recommendations for Outpatient Follow-up:  1. Pt will need to follow up with PCP in 2-3 weeks post discharge 2. Please obtain BMP to evaluate electrolytes and kidney function 3. Please also check CBC to evaluate Hg and Hct levels 4. Pt discharge on Cipro and Flagyl to complete therapy for enteritis/diverticulitis   Discharge Diagnoses:  Active Problems:   Acute diverticulitis   Diverticulitis  Discharge Condition: Stable  Diet recommendation: Heart healthy diet discussed in details   Brief narrative:  Patient is 53 year old female who presents to emergency department with main concern of progressively worsening generalized abdominal pain, throbbing and cramping, intermittent in nature, 5/10 in severity, associated with multiple episodes of nonbloody vomiting and nonbloody diarrhea. This started 2 days prior to this admission. Patient also reports associated subjective fevers and chills, poor oral intake. She denies chest pain and shortness of breath, no blood in stool. Patient also denies weight loss, no other systemic symptoms such as night sweats.no recent sick contacts or exposures, no similar events in the past.  In emergency department, patient found to be hemodynamically stable, CT abdomen and pelvis significant for acute diverticulitis. Patient started on IV antibiotics and [facility] hospitalist asked to admit for further management.  \  Assessment and Plan:  Active Problems:  Acute diverticulitis  - pt clinically improving and tolerating regular diet well  - WBC is trending down and pt afebrile over the past 48 hours  - continue Cipro and Flagyl day #4/14  - no indication for surgical intervention at this time, will cancel surgery consultation, appreciate concern  - continue analgesia and antiemetics as  needed upon discharge  Acute renal failure  - Likely pre-renal in etiology and secondary to vomiting and diarrhea, poor oral intake, dehydration  - IVF provided and pt has responded well, Cr is now WNL  Hypokalemia  - Again this is most likely secondary to vomiting and diarrhea  - Mg is WNL, K is WNL this AM Leukocytosis  - Secondary to principal problem  - WBC in now WNL  HTN  - continue to hold chlorthalidone due to soft BP  Consultants:  None Procedures/Studies:  CXR 11/21/2013 No active cardiopulmonary disease.  CT Abd Pelvis W Contrast 11/21/2013 Lower sigmoid acute diverticulitis within the pelvis. Small adjacent sigmoid 24 x 21 mm air-fluid collection, ? diverticulum versus a small adjacent developing abscess, not amenable to CT-guided drainage, suspect secondary enteritis of the ileum with an associated functional SBO. Antibiotics:  Ciprofloxacin 4/4 -->  Flagyl 4/5 -->   Code Status: Full  Family Communication: Pt at bedside  Discharge Exam: Filed Vitals:   11/23/13 2100  BP: 139/84  Pulse: 76  Temp: 98.7 F (37.1 C)  Resp: 20   Filed Vitals:   11/22/13 2017 11/23/13 0408 11/23/13 1835 11/23/13 2100  BP: 103/67 118/73 147/80 139/84  Pulse: 80 79 80 76  Temp: 98.8 F (37.1 C) 98.9 F (37.2 C) 98 F (36.7 C) 98.7 F (37.1 C)  TempSrc: Oral Oral Oral Oral  Resp: 20 20 20 20   Height:      Weight:      SpO2: 94% 93% 94% 99%    General: Pt is alert, follows commands appropriately, not in acute distress Cardiovascular: Regular rate and rhythm, S1/S2 +, no murmurs, no rubs, no gallops Respiratory: Clear to auscultation bilaterally, no wheezing, no  crackles, no rhonchi Abdominal: Soft, non tender, non distended, bowel sounds +, no guarding Extremities: no edema, no cyanosis, pulses palpable bilaterally DP and PT Neuro: Grossly nonfocal  Discharge Instructions  Discharge Orders   Future Orders Complete By Expires   Diet - low sodium heart healthy  As directed     Increase activity slowly  As directed        Medication List    STOP taking these medications       naproxen sodium 220 MG tablet  Commonly known as:  ANAPROX      TAKE these medications       albuterol (2.5 MG/3ML) 0.083% nebulizer solution  Commonly known as:  PROVENTIL  Take 2.5 mg by nebulization every 6 (six) hours as needed.     albuterol 108 (90 BASE) MCG/ACT inhaler  Commonly known as:  PROVENTIL HFA;VENTOLIN HFA  Inhale 2 puffs into the lungs every 6 (six) hours as needed.     atenolol-chlorthalidone 100-25 MG per tablet  Commonly known as:  TENORETIC  Take 1 tablet by mouth every morning.     busPIRone 10 MG tablet  Commonly known as:  BUSPAR  Take 10 mg by mouth 2 (two) times daily.     ciprofloxacin 500 MG tablet  Commonly known as:  CIPRO  Take 1 tablet (500 mg total) by mouth 2 (two) times daily.     EYE DROPS OP  Apply 1-2 drops to eye daily as needed (allergies).     lovastatin 20 MG tablet  Commonly known as:  MEVACOR  Take 20 mg by mouth every morning.     metroNIDAZOLE 500 MG tablet  Commonly known as:  FLAGYL  Take 1 tablet (500 mg total) by mouth every 8 (eight) hours.     ondansetron 4 MG tablet  Commonly known as:  ZOFRAN  Take 1 tablet (4 mg total) by mouth every 6 (six) hours as needed for nausea.     oxyCODONE 5 MG immediate release tablet  Commonly known as:  Oxy IR/ROXICODONE  Take 1 tablet (5 mg total) by mouth every 4 (four) hours as needed for moderate pain.     potassium chloride SA 20 MEQ tablet  Commonly known as:  K-DUR,KLOR-CON  Take 1 tablet (20 mEq total) by mouth daily.     VITAMIN B 12 PO  Take 500 mg by mouth daily.           Follow-up Information   Schedule an appointment as soon as possible for a visit with Debbora Presto, MD. (As needed, If symptoms worsen, call my cell phone [phone])    Specialty:  Internal Medicine   Contact information:   [address] [place]  [address] [phone]       Follow up with [facility] On 12/01/2013. (at 10:00am, Bring $25-$35 and documents from financial qualification handout.)    Contact information:   [address]  [phone]       The results of significant diagnostics from this hospitalization (including imaging, microbiology, ancillary and laboratory) are listed below for reference.     Microbiology: No results found for this or any previous visit (from the past 240 hour(s)).   Labs: Basic Metabolic Panel:  Recent Labs Lab 11/21/13 1305 11/22/13 0543 11/23/13 0636 11/24/13 0550  NA 134* 133* 134* 135*  K 2.9* 3.0* 3.4* 4.2  CL 87* 93* 93* 93*  CO2 30 28 29 29   GLUCOSE 143* 133* 132* 97  BUN 12 7 4* 4*  CREATININE 1.23* 0.75 0.68 0.74  CALCIUM 10.6* 8.5 9.0 9.6  MG 1.9  --   --   --    Liver Function Tests:  Recent Labs Lab 11/21/13 1305  AST 15  ALT 11  ALKPHOS 83  BILITOT 1.1  PROT 8.9*  ALBUMIN 4.1    Recent Labs Lab 11/21/13 1305  LIPASE 17   CBC:  Recent Labs Lab 11/21/13 1305 11/22/13 0543 11/23/13 0636 11/24/13 0550  WBC 14.0* 10.1 9.1 10.5  NEUTROABS 12.0*  --   --   --   HGB 16.1* 12.6 12.3 12.5  HCT 46.2* 37.0 36.3 37.2  MCV 100.9* 102.2* 101.7* 100.8*  PLT 202 174 190 219   SIGNED: Time coordinating discharge: Over 30 minutes  Debbora Presto, MD  [facility] 11/24/2013, 8:23 AM Pager [phone]  If 7PM-7AM, please contact night-coverage [email or web address] Password [email or web address]

## 2013-12-03 ENCOUNTER — Other Ambulatory Visit (HOSPITAL_COMMUNITY): Payer: Self-pay | Admitting: Nurse Practitioner

## 2013-12-03 DIAGNOSIS — Z1231 Encounter for screening mammogram for malignant neoplasm of breast: Secondary | ICD-10-CM

## 2013-12-15 ENCOUNTER — Ambulatory Visit (HOSPITAL_COMMUNITY)
Admission: RE | Admit: 2013-12-15 | Discharge: 2013-12-15 | Disposition: A | Discharge status: Home / Self Care | Payer: PRIVATE HEALTH INSURANCE | Source: Ambulatory Visit | Attending: Nurse Practitioner | Admitting: Nurse Practitioner

## 2013-12-15 DIAGNOSIS — Z1231 Encounter for screening mammogram for malignant neoplasm of breast: Secondary | ICD-10-CM | POA: Insufficient documentation

## 2014-07-26 ENCOUNTER — Other Ambulatory Visit (HOSPITAL_COMMUNITY): Payer: Self-pay | Admitting: Family Medicine

## 2014-07-26 DIAGNOSIS — M542 Cervicalgia: Secondary | ICD-10-CM

## 2014-07-28 ENCOUNTER — Ambulatory Visit (HOSPITAL_COMMUNITY)
Admission: RE | Admit: 2014-07-28 | Discharge: 2014-07-28 | Disposition: A | Discharge status: Home / Self Care | Payer: Self-pay | Source: Ambulatory Visit | Attending: Family Medicine | Admitting: Family Medicine

## 2014-07-28 DIAGNOSIS — M79602 Pain in left arm: Secondary | ICD-10-CM | POA: Insufficient documentation

## 2014-07-28 DIAGNOSIS — M4802 Spinal stenosis, cervical region: Secondary | ICD-10-CM | POA: Insufficient documentation

## 2014-07-28 DIAGNOSIS — M542 Cervicalgia: Secondary | ICD-10-CM | POA: Insufficient documentation

## 2014-07-28 DIAGNOSIS — R200 Anesthesia of skin: Secondary | ICD-10-CM | POA: Insufficient documentation

## 2015-02-20 ENCOUNTER — Emergency Department (HOSPITAL_COMMUNITY)
Admission: EM | Admit: 2015-02-20 | Discharge: 2015-02-20 | Disposition: A | Discharge status: Home / Self Care | Payer: Self-pay | Attending: Emergency Medicine | Admitting: Emergency Medicine

## 2015-02-20 ENCOUNTER — Emergency Department (HOSPITAL_COMMUNITY): Payer: Self-pay

## 2015-02-20 ENCOUNTER — Encounter (HOSPITAL_COMMUNITY): Payer: Self-pay | Admitting: *Deleted

## 2015-02-20 ENCOUNTER — Emergency Department (HOSPITAL_COMMUNITY): Payer: Medicaid Other

## 2015-02-20 DIAGNOSIS — Z7951 Long term (current) use of inhaled steroids: Secondary | ICD-10-CM | POA: Insufficient documentation

## 2015-02-20 DIAGNOSIS — J449 Chronic obstructive pulmonary disease, unspecified: Secondary | ICD-10-CM | POA: Insufficient documentation

## 2015-02-20 DIAGNOSIS — E780 Pure hypercholesterolemia: Secondary | ICD-10-CM | POA: Insufficient documentation

## 2015-02-20 DIAGNOSIS — F419 Anxiety disorder, unspecified: Secondary | ICD-10-CM | POA: Insufficient documentation

## 2015-02-20 DIAGNOSIS — Y9301 Activity, walking, marching and hiking: Secondary | ICD-10-CM | POA: Insufficient documentation

## 2015-02-20 DIAGNOSIS — Z79899 Other long term (current) drug therapy: Secondary | ICD-10-CM | POA: Insufficient documentation

## 2015-02-20 DIAGNOSIS — I10 Essential (primary) hypertension: Secondary | ICD-10-CM | POA: Insufficient documentation

## 2015-02-20 DIAGNOSIS — Y998 Other external cause status: Secondary | ICD-10-CM | POA: Insufficient documentation

## 2015-02-20 DIAGNOSIS — Z792 Long term (current) use of antibiotics: Secondary | ICD-10-CM | POA: Insufficient documentation

## 2015-02-20 DIAGNOSIS — W102XXA Fall (on)(from) incline, initial encounter: Secondary | ICD-10-CM | POA: Insufficient documentation

## 2015-02-20 DIAGNOSIS — Z8739 Personal history of other diseases of the musculoskeletal system and connective tissue: Secondary | ICD-10-CM | POA: Insufficient documentation

## 2015-02-20 DIAGNOSIS — Z8719 Personal history of other diseases of the digestive system: Secondary | ICD-10-CM | POA: Insufficient documentation

## 2015-02-20 DIAGNOSIS — S4292XA Fracture of left shoulder girdle, part unspecified, initial encounter for closed fracture: Secondary | ICD-10-CM

## 2015-02-20 DIAGNOSIS — S42292A Other displaced fracture of upper end of left humerus, initial encounter for closed fracture: Secondary | ICD-10-CM | POA: Insufficient documentation

## 2015-02-20 DIAGNOSIS — W19XXXA Unspecified fall, initial encounter: Secondary | ICD-10-CM

## 2015-02-20 DIAGNOSIS — Z720 Tobacco use: Secondary | ICD-10-CM | POA: Insufficient documentation

## 2015-02-20 DIAGNOSIS — Y9289 Other specified places as the place of occurrence of the external cause: Secondary | ICD-10-CM | POA: Insufficient documentation

## 2015-02-20 HISTORY — DX: Diverticulitis of intestine, part unspecified, without perforation or abscess without bleeding: K57.92

## 2015-02-20 MED ORDER — HYDROMORPHONE HCL 1 MG/ML IJ SOLN
1.0000 mg | Freq: Once | INTRAMUSCULAR | Status: AC
  Administered 2015-02-20: 1 mg via INTRAMUSCULAR
  Filled 2015-02-20: qty 1

## 2015-02-20 MED ORDER — OXYCODONE-ACETAMINOPHEN 5-325 MG PO TABS: 1.0000 | ORAL_TABLET | Freq: Four times a day (QID) | ORAL | Status: DC | PRN

## 2015-02-20 NOTE — ED Provider Notes (Signed)
CSN: [id number]     Arrival date & time 02/20/15  1318 History   First MD Initiated Contact with Patient 02/20/15 1329     Chief Complaint  Patient presents with  . Arm Injury     (Consider location/radiation/quality/duration/timing/severity/associated sxs/prior Treatment) Patient is a 54 y.o. female presenting with arm injury. The history is provided by the patient (the pt fell yesterday and hurt her left shoulder).  Arm Injury Location:  Shoulder Injury: yes   Mechanism of injury comment:  Fall Shoulder location:  L shoulder Pain details:    Quality:  Aching   Radiates to:  Does not radiate   Severity:  Moderate   Onset quality:  Sudden Associated symptoms: no back pain and no fatigue     Past Medical History  Diagnosis Date  . COPD (chronic obstructive pulmonary disease)   . Hypertension   . Anxiety   . GERD (gastroesophageal reflux disease)   . DDD (degenerative disc disease), lumbar   . High cholesterol   . Diverticulitis    Past Surgical History  Procedure Laterality Date  . Abdominal hysterectomy     No family history on file. History  Substance Use Topics  . Smoking status: Current Every Day Smoker -- 1.50 packs/day for 40 years    Types: Cigarettes  . Smokeless tobacco: Not on file  . Alcohol Use: Yes     Comment: beer daily x 6   OB History    No data available     Review of Systems  Constitutional: Negative for appetite change and fatigue.  HENT: Negative for congestion, ear discharge and sinus pressure.   Eyes: Negative for discharge.  Respiratory: Negative for cough.   Cardiovascular: Negative for chest pain.  Gastrointestinal: Negative for abdominal pain and diarrhea.  Genitourinary: Negative for frequency and hematuria.  Musculoskeletal: Negative for back pain.       Shoulder pain  Skin: Negative for rash.  Neurological: Negative for seizures and headaches.  Psychiatric/Behavioral: Negative for hallucinations.      Allergies   Codeine  Home Medications   Prior to Admission medications   Medication Sig Start Date End Date Taking? Authorizing Provider  albuterol (PROVENTIL HFA;VENTOLIN HFA) 108 (90 BASE) MCG/ACT inhaler Inhale 2 puffs into the lungs every 6 (six) hours as needed.   Yes Historical Provider, MD  albuterol (PROVENTIL) (2.5 MG/3ML) 0.083% nebulizer solution Take 2.5 mg by nebulization every 6 (six) hours as needed.   Yes Historical Provider, MD  budesonide-formoterol (SYMBICORT) 80-4.5 MCG/ACT inhaler Inhale 2 puffs into the lungs 2 (two) times daily.   Yes Historical Provider, MD  lisinopril-hydrochlorothiazide (PRINZIDE,ZESTORETIC) 20-12.5 MG per tablet Take 1 tablet by mouth daily.   Yes Historical Provider, MD  lovastatin (MEVACOR) 20 MG tablet Take 20 mg by mouth at bedtime.   Yes Historical Provider, MD  Oxycodone HCl 10 MG TABS Take 10 mg by mouth 4 (four) times daily.   Yes Historical Provider, MD  PARoxetine (PAXIL) 20 MG tablet Take 20 mg by mouth daily.   Yes Historical Provider, MD  simethicone (GAS-X) 80 MG chewable tablet Chew 80 mg by mouth every 6 (six) hours as needed for flatulence.   Yes Historical Provider, MD  tetrahydrozoline 0.05 % ophthalmic solution Place 1 drop into both eyes as needed (Dry Eyes).   Yes Historical Provider, MD  verapamil (CALAN) 120 MG tablet Take 120 mg by mouth 2 (two) times daily.   Yes Historical Provider, MD  ciprofloxacin (CIPRO) 500 MG tablet  Take 1 tablet (500 mg total) by mouth 2 (two) times daily. Patient not taking: Reported on 02/20/2015 11/23/13   [name], MD  lovastatin (MEVACOR) 20 MG tablet Take 20 mg by mouth every morning.    Historical Provider, MD  metroNIDAZOLE (FLAGYL) 500 MG tablet Take 1 tablet (500 mg total) by mouth every 8 (eight) hours. Patient not taking: Reported on 02/20/2015 11/23/13   [name], MD  ondansetron (ZOFRAN) 4 MG tablet Take 1 tablet (4 mg total) by mouth every 6 (six) hours as needed for nausea. Patient not taking:  Reported on 02/20/2015 11/23/13   [name], MD  oxyCODONE (OXY IR/ROXICODONE) 5 MG immediate release tablet Take 1 tablet (5 mg total) by mouth every 4 (four) hours as needed for moderate pain. Patient not taking: Reported on 02/20/2015 11/23/13   [name], MD  oxyCODONE-acetaminophen (PERCOCET/ROXICET) 5-325 MG per tablet Take 1 tablet by mouth every 6 (six) hours as needed. 02/20/15   Bethann Berkshire, MD   BP 109/64 mmHg  Pulse 85  Temp(Src) 98.3 F (36.8 C) (Oral)  Resp 16  Ht  (1.6 m)  Wt 135 lb (61.236 kg)  BMI 23.92 kg/m2  SpO2 94% Physical Exam  Constitutional: She is oriented to person, place, and time. She appears well-developed.  HENT:  Head: Normocephalic.  Eyes: Conjunctivae are normal.  Neck: No tracheal deviation present.  Cardiovascular:  No murmur heard. Musculoskeletal:  Tender left shoulder,  Some bruising. Neuro vasc nl  Neurological: She is oriented to person, place, and time.  Skin: Skin is warm.  Psychiatric: She has a normal mood and affect.    ED Course  Procedures (including critical care time) Labs Review Labs Reviewed - No data to display  Imaging Review Dg Shoulder Left  02/20/2015   CLINICAL DATA:  Left shoulder pain secondary to a fall last night at 12:30 a.m.  EXAM: LEFT SHOULDER - 2+ VIEW  COMPARISON:  None.  FINDINGS: There is a comminuted fracture of the left humeral head and neck with slight impaction and displacement of some of the fracture fragments. The humeral head does not appear dislocated denuded is slightly inferiorly subluxed, probably due to hemarthrosis.  IMPRESSION: Comminuted impacted fracture of the left humeral head and neck.   Electronically Signed   By: Francene Boyers M.D.   On: 02/20/2015 15:39   Dg Humerus Left  02/20/2015   CLINICAL DATA:  Initial encounter for fall last night at 12:30. Pain swelling and bruising.  EXAM: LEFT HUMERUS - 2+ VIEW  COMPARISON:  None.  FINDINGS: Comminuted fracture of the proximal humerus  involving the head/neck junction. Intra-articular extension. No dislocation. Distal humerus unremarkable. Visualized portion of the left hemithorax is normal.  IMPRESSION: Comminuted intra-articular proximal humerus fracture. Dedicated shoulder radiographs may be informative.   Electronically Signed   By: Jeronimo Greaves M.D.   On: 02/20/2015 14:30     EKG Interpretation None      MDM   Final diagnoses:  Fall  Shoulder fracture, left, closed, initial encounter    Comminuted left shoulder fx,   tx with shoulder immobilizer, percocet and ortho in 2-3 days       Bethann Berkshire, MD 02/20/15 1630

## 2015-02-20 NOTE — Discharge Instructions (Signed)
Follow up with dr. Romeo AppleHarrison in 2-3 days

## 2015-02-20 NOTE — ED Notes (Signed)
Pt fell last night at 1230 while walking down a ramp. Pain, swelling and bruising to left upper arm.

## 2015-02-22 ENCOUNTER — Telehealth: Payer: Self-pay | Admitting: Orthopedic Surgery

## 2015-02-22 NOTE — Telephone Encounter (Signed)
Patient, and daughter Karena AddisonDiane Cook, requests appointment, follow up from [facility], fracture of left shoulder, date of injury 02/20/15.  States told by  Physician there she may require surgery.  Daughter relays that patient is also applying for both "emergency Medicaid" and [employer] discount.  Aware of self-pay policy and minimum requirement, as no coverage in place at this time.  Please review and advise.  Ph [phone].

## 2015-02-22 NOTE — Telephone Encounter (Signed)
Called back to patient - schedules appointment accordingly.

## 2015-02-22 NOTE — Telephone Encounter (Signed)
xrays reviewed no surgery needed   Appointment for 13 th am

## 2015-03-02 ENCOUNTER — Encounter: Payer: Self-pay | Admitting: Orthopedic Surgery

## 2015-03-02 ENCOUNTER — Ambulatory Visit (INDEPENDENT_AMBULATORY_CARE_PROVIDER_SITE_OTHER): Payer: Self-pay | Admitting: Orthopedic Surgery

## 2015-03-02 VITALS — BP 108/77 | Ht 63.0 in | Wt 135.0 lb

## 2015-03-02 DIAGNOSIS — S42202A Unspecified fracture of upper end of left humerus, initial encounter for closed fracture: Secondary | ICD-10-CM

## 2015-03-02 NOTE — Progress Notes (Signed)
Patient ID: Kathleen Bennett, female   DOB: 12/17/1960, 54 y.o.   MRN: [id number]  Chief Complaint  Patient presents with  . Follow-up    er follow up left humeral and neck fracture, DOI 02/20/15    Data Reviewed X-ray taken at the hospital shows a proximal humerus fracture comminuted with no fragment displacement more than 1 cm or angulated more than 45  Diagnosis Encounter Diagnosis  Name Primary?  . Proximal humerus fracture, left, closed, initial encounter Yes   Management The patient is in a shoulder immobilizer continue shoulder immobilizer repeat left shoulder x-ray in one week    Kathleen Bennett is a 54 y.o. female.   HPI This 54 year old female fell and injured her left shoulder 10 days ago she was placed in a shoulder immobilizer after ER visit. She has chronic neck pain with radiculopathy and cervical spondylosis and takes 10 mg of hydrocodone on a every 8 hours basis. She's been evaluated by neurology and thought to be nonsurgical  She complains of pain over the left shoulder described as a dull ache with moderate to severe pain of 10 days' duration associated with subcutaneous ecchymosis down to the wrist.  She denies numbness tingling but does complain of loss of shoulder motion   Review of Systems See hpi  Past Medical History  Diagnosis Date  . COPD (chronic obstructive pulmonary disease)   . Hypertension   . Anxiety   . GERD (gastroesophageal reflux disease)   . DDD (degenerative disc disease), lumbar   . High cholesterol   . Diverticulitis     Past Surgical History  Procedure Laterality Date  . Abdominal hysterectomy      No family history on file.  Social History History  Substance Use Topics  . Smoking status: Current Every Day Smoker -- 1.50 packs/day for 40 years    Types: Cigarettes  . Smokeless tobacco: Not on file  . Alcohol Use: Yes     Comment: beer daily x 6    Allergies  Allergen Reactions  . Codeine Itching and Other (See  Comments)    REACTION: Severe itching, feels like skin is crawling    Current Outpatient Prescriptions  Medication Sig Dispense Refill  . lisinopril-hydrochlorothiazide (PRINZIDE,ZESTORETIC) 20-12.5 MG per tablet Take 1 tablet by mouth daily.    Marland Kitchen. lovastatin (MEVACOR) 20 MG tablet Take 20 mg by mouth every morning.    . Oxycodone HCl 10 MG TABS Take 10 mg by mouth 4 (four) times daily.    Marland Kitchen. PARoxetine (PAXIL) 20 MG tablet Take 20 mg by mouth daily.    . verapamil (CALAN) 120 MG tablet Take 120 mg by mouth 2 (two) times daily.    Marland Kitchen. albuterol (PROVENTIL HFA;VENTOLIN HFA) 108 (90 BASE) MCG/ACT inhaler Inhale 2 puffs into the lungs every 6 (six) hours as needed.    Marland Kitchen. albuterol (PROVENTIL) (2.5 MG/3ML) 0.083% nebulizer solution Take 2.5 mg by nebulization every 6 (six) hours as needed.    . budesonide-formoterol (SYMBICORT) 80-4.5 MCG/ACT inhaler Inhale 2 puffs into the lungs 2 (two) times daily.    . ciprofloxacin (CIPRO) 500 MG tablet Take 1 tablet (500 mg total) by mouth 2 (two) times daily. (Patient not taking: Reported on 02/20/2015) 28 tablet 0  . lovastatin (MEVACOR) 20 MG tablet Take 20 mg by mouth at bedtime.    . metroNIDAZOLE (FLAGYL) 500 MG tablet Take 1 tablet (500 mg total) by mouth every 8 (eight) hours. (Patient not taking: Reported on 02/20/2015) 42  tablet 0  . ondansetron (ZOFRAN) 4 MG tablet Take 1 tablet (4 mg total) by mouth every 6 (six) hours as needed for nausea. (Patient not taking: Reported on 02/20/2015) 65 tablet 0  . oxyCODONE (OXY IR/ROXICODONE) 5 MG immediate release tablet Take 1 tablet (5 mg total) by mouth every 4 (four) hours as needed for moderate pain. (Patient not taking: Reported on 02/20/2015) 65 tablet 0  . oxyCODONE-acetaminophen (PERCOCET/ROXICET) 5-325 MG per tablet Take 1 tablet by mouth every 6 (six) hours as needed. 30 tablet 0  . simethicone (GAS-X) 80 MG chewable tablet Chew 80 mg by mouth every 6 (six) hours as needed for flatulence.    Marland Kitchen tetrahydrozoline  0.05 % ophthalmic solution Place 1 drop into both eyes as needed (Dry Eyes).     No current facility-administered medications for this visit.       Physical Exam Blood pressure 108/77, height  (1.6 m), weight 135 lb (61.236 kg). Physical Exam The patient is well developed well nourished and well groomed. Orientation to person place and time is normal  Mood is pleasant. Ambulatory status she ambulates with no assistive devices. She has a tenderness over the proximal humerus without deformity. The skin is showing subcutaneous ecchymosis. She has normal sensation in her hand wrist and forearm and has excellent grip strength and good distal pulse. Muscle tone remains normal we deferred stability tests and range of motion test at the shoulder joint secondary to pain

## 2015-03-03 ENCOUNTER — Ambulatory Visit: Payer: Medicaid Other | Admitting: Orthopedic Surgery

## 2015-03-09 ENCOUNTER — Ambulatory Visit (INDEPENDENT_AMBULATORY_CARE_PROVIDER_SITE_OTHER): Payer: Self-pay | Admitting: Orthopedic Surgery

## 2015-03-09 ENCOUNTER — Ambulatory Visit (INDEPENDENT_AMBULATORY_CARE_PROVIDER_SITE_OTHER): Payer: Self-pay

## 2015-03-09 ENCOUNTER — Encounter: Payer: Self-pay | Admitting: Orthopedic Surgery

## 2015-03-09 VITALS — BP 105/67 | HR 72 | Ht 63.0 in | Wt 135.0 lb

## 2015-03-09 DIAGNOSIS — S42202D Unspecified fracture of upper end of left humerus, subsequent encounter for fracture with routine healing: Secondary | ICD-10-CM

## 2015-03-09 DIAGNOSIS — S42202S Unspecified fracture of upper end of left humerus, sequela: Secondary | ICD-10-CM

## 2015-03-09 NOTE — Progress Notes (Signed)
Patient ID: Kathleen Bennett, female   DOB: 08/05/1961, 54 y.o.   MRN: [id number]  Follow up visit  Chief Complaint  Patient presents with  . Follow-up    1 week follow up + xray left shoulder, DOI 02/20/15    BP 105/67 mmHg  Pulse 72  Ht 5\' 3"  (1.6 m)  Wt 135 lb (61.236 kg)  BMI 23.92 kg/m2  Encounter Diagnosis  Name Primary?  . Proximal humerus fracture, left, with routine healing, subsequent encounter Yes    Today's x-rays are better quality films in terms of the AP view we have a [place] view and on [place] we look like we have 50% translation of the humeral head in relation to the humeral shaft on the lateral view we are perfectly aligned  Based on the patient's age of 54 I think it's warranted to get a CAT scan to better evaluate this fracture for further treatment recommendations.

## 2015-03-16 ENCOUNTER — Ambulatory Visit (HOSPITAL_COMMUNITY): Payer: Self-pay

## 2015-03-18 ENCOUNTER — Ambulatory Visit (HOSPITAL_COMMUNITY)
Admission: RE | Admit: 2015-03-18 | Discharge: 2015-03-18 | Disposition: A | Discharge status: Home / Self Care | Payer: No Typology Code available for payment source | Source: Ambulatory Visit | Attending: Orthopedic Surgery | Admitting: Orthopedic Surgery

## 2015-03-18 DIAGNOSIS — S42202D Unspecified fracture of upper end of left humerus, subsequent encounter for fracture with routine healing: Secondary | ICD-10-CM

## 2015-03-18 DIAGNOSIS — S42292D Other displaced fracture of upper end of left humerus, subsequent encounter for fracture with routine healing: Secondary | ICD-10-CM | POA: Insufficient documentation

## 2015-03-18 DIAGNOSIS — X58XXXD Exposure to other specified factors, subsequent encounter: Secondary | ICD-10-CM | POA: Insufficient documentation

## 2015-03-21 ENCOUNTER — Emergency Department (HOSPITAL_COMMUNITY): Payer: Self-pay

## 2015-03-21 ENCOUNTER — Encounter (HOSPITAL_COMMUNITY): Payer: Self-pay

## 2015-03-21 ENCOUNTER — Emergency Department (HOSPITAL_COMMUNITY)
Admission: EM | Admit: 2015-03-21 | Discharge: 2015-03-21 | Disposition: A | Discharge status: Home / Self Care | Payer: Self-pay | Attending: Emergency Medicine | Admitting: Emergency Medicine

## 2015-03-21 DIAGNOSIS — E780 Pure hypercholesterolemia: Secondary | ICD-10-CM | POA: Insufficient documentation

## 2015-03-21 DIAGNOSIS — Z79899 Other long term (current) drug therapy: Secondary | ICD-10-CM | POA: Insufficient documentation

## 2015-03-21 DIAGNOSIS — S40012A Contusion of left shoulder, initial encounter: Secondary | ICD-10-CM

## 2015-03-21 DIAGNOSIS — Z720 Tobacco use: Secondary | ICD-10-CM | POA: Insufficient documentation

## 2015-03-21 DIAGNOSIS — Z7951 Long term (current) use of inhaled steroids: Secondary | ICD-10-CM | POA: Insufficient documentation

## 2015-03-21 DIAGNOSIS — J449 Chronic obstructive pulmonary disease, unspecified: Secondary | ICD-10-CM | POA: Insufficient documentation

## 2015-03-21 DIAGNOSIS — Z8719 Personal history of other diseases of the digestive system: Secondary | ICD-10-CM | POA: Insufficient documentation

## 2015-03-21 DIAGNOSIS — S93402A Sprain of unspecified ligament of left ankle, initial encounter: Secondary | ICD-10-CM

## 2015-03-21 DIAGNOSIS — Y9389 Activity, other specified: Secondary | ICD-10-CM | POA: Insufficient documentation

## 2015-03-21 DIAGNOSIS — Y9289 Other specified places as the place of occurrence of the external cause: Secondary | ICD-10-CM | POA: Insufficient documentation

## 2015-03-21 DIAGNOSIS — I10 Essential (primary) hypertension: Secondary | ICD-10-CM | POA: Insufficient documentation

## 2015-03-21 DIAGNOSIS — F419 Anxiety disorder, unspecified: Secondary | ICD-10-CM | POA: Insufficient documentation

## 2015-03-21 DIAGNOSIS — Y998 Other external cause status: Secondary | ICD-10-CM | POA: Insufficient documentation

## 2015-03-21 DIAGNOSIS — W01190A Fall on same level from slipping, tripping and stumbling with subsequent striking against furniture, initial encounter: Secondary | ICD-10-CM | POA: Insufficient documentation

## 2015-03-21 NOTE — ED Notes (Signed)
Patient given discharge instruction, verbalized understand. Patient ambulatory out of the department.  

## 2015-03-21 NOTE — ED Notes (Signed)
[name] today, hitting left shoulder on wooden bench and twisting left ankle

## 2015-03-21 NOTE — Discharge Instructions (Signed)
Your x-ray is negative for fracture or dislocation of the left ankle. Your examination is consistent with an ankle sprain. Please use the splint for the next 7-10 days. Ankle Sprain An ankle sprain is an injury to the strong, fibrous tissues (ligaments) that hold the bones of your ankle joint together.  CAUSES An ankle sprain is usually caused by a fall or by twisting your ankle. Ankle sprains most commonly occur when you step on the outer edge of your foot, and your ankle turns inward. People who participate in sports are more prone to these types of injuries.  SYMPTOMS   Pain in your ankle. The pain may be present at rest or only when you are trying to stand or walk.  Swelling.  Bruising. Bruising may develop immediately or within 1 to 2 days after your injury.  Difficulty standing or walking, particularly when turning corners or changing directions. DIAGNOSIS  Your caregiver will ask you details about your injury and perform a physical exam of your ankle to determine if you have an ankle sprain. During the physical exam, your caregiver will press on and apply pressure to specific areas of your foot and ankle. Your caregiver will try to move your ankle in certain ways. An X-ray exam may be done to be sure a bone was not broken or a ligament did not separate from one of the bones in your ankle (avulsion fracture).  TREATMENT  Certain types of braces can help stabilize your ankle. Your caregiver can make a recommendation for this. Your caregiver may recommend the use of medicine for pain. If your sprain is severe, your caregiver may refer you to a surgeon who helps to restore function to parts of your skeletal system (orthopedist) or a physical therapist. HOME CARE INSTRUCTIONS   Apply ice to your injury for 1-2 days or as directed by your caregiver. Applying ice helps to reduce inflammation and pain.  Put ice in a plastic bag.  Place a towel between your skin and the bag.  Leave the ice on  for 15-20 minutes at a time, every 2 hours while you are awake.  Only take over-the-counter or prescription medicines for pain, discomfort, or fever as directed by your caregiver.  Elevate your injured ankle above the level of your heart as much as possible for 2-3 days.  If your caregiver recommends crutches, use them as instructed. Gradually put weight on the affected ankle. Continue to use crutches or a cane until you can walk without feeling pain in your ankle.  If you have a plaster splint, wear the splint as directed by your caregiver. Do not rest it on anything harder than a pillow for the first 24 hours. Do not put weight on it. Do not get it wet. You may take it off to take a shower or bath.  You may have been given an elastic bandage to wear around your ankle to provide support. If the elastic bandage is too tight (you have numbness or tingling in your foot or your foot becomes cold and blue), adjust the bandage to make it comfortable.  If you have an air splint, you may blow more air into it or let air out to make it more comfortable. You may take your splint off at night and before taking a shower or bath. Wiggle your toes in the splint several times per day to decrease swelling. SEEK MEDICAL CARE IF:   You have rapidly increasing bruising or swelling.  Your toes feel  extremely cold or you lose feeling in your foot.  Your pain is not relieved with medicine. SEEK IMMEDIATE MEDICAL CARE IF:  Your toes are numb or blue.  You have severe pain that is increasing. MAKE SURE YOU:   Understand these instructions.  Will watch your condition.  Will get help right away if you are not doing well or get worse. Document Released: 08/06/2005 Document Revised: 04/30/2012 Document Reviewed: 08/18/2011 [facility] Patient Information 2015 [place], [place]. This information is not intended to replace advice given to you by your health care provider. Make sure you discuss any questions you have  with your health care provider.  Please use the ankle stirrup splint, please continue the instructions from Dr. Romeo Apple, and Dr. Delbert Harness.

## 2015-03-21 NOTE — ED Provider Notes (Signed)
CSN: [id number]     Arrival date & time 03/21/15  1447 History  This chart was scribed for non-physician practitioner, Ivery Quale, PA-C working with Mancel Bale, MD by Placido Sou, ED scribe. This patient was seen in room APFT20/APFT[facility] and the patient's care was started at 4:07 PM.   Chief Complaint  Patient presents with  . Fall   Patient is a 54 y.o. female presenting with fall. The history is provided by the patient. No language interpreter was used.  Fall    HPI Comments: Kathleen Bennett is a 54 y.o. female who presents to the Emergency Department in a left shoulder sling complaining of a fall that occurred earlier today. Pt notes standing up, her left ankle collapsing and then falling forewards onto a wooden bench and landing on her left shoulder. She notes associated, constant, moderate, pain and swelling to her left shoulder, ankle and superior foot. Pt notes having a current broken left shoulder in 3 locations that occurred on July 3rd and further notes that her orthopaedist said she would need a shoulder surgery. She notes taking pain medication for her other prior symptoms with her last dosage being 2 hours ago. She denies any current health insurance or being on any blood thinners. Pt denied having an x-ray of the affected shoulder due to recently having imaging done due to her current fractures.   Past Medical History  Diagnosis Date  . COPD (chronic obstructive pulmonary disease)   . Hypertension   . Anxiety   . GERD (gastroesophageal reflux disease)   . DDD (degenerative disc disease), lumbar   . High cholesterol   . Diverticulitis    Past Surgical History  Procedure Laterality Date  . Abdominal hysterectomy     History reviewed. No pertinent family history. History  Substance Use Topics  . Smoking status: Current Every Day Smoker -- 1.50 packs/day for 40 years    Types: Cigarettes  . Smokeless tobacco: Not on file  . Alcohol Use: Yes     Comment: beer daily x  6   OB History    No data available     Review of Systems  Musculoskeletal: Positive for myalgias, joint swelling and arthralgias. Negative for back pain and neck pain.  Skin: Negative for wound.  All other systems reviewed and are negative.  Allergies  Codeine  Home Medications   Prior to Admission medications   Medication Sig Start Date End Date Taking? Authorizing Provider  albuterol (PROVENTIL HFA;VENTOLIN HFA) 108 (90 BASE) MCG/ACT inhaler Inhale 2 puffs into the lungs every 6 (six) hours as needed.    Historical Provider, MD  albuterol (PROVENTIL) (2.5 MG/3ML) 0.083% nebulizer solution Take 2.5 mg by nebulization every 6 (six) hours as needed.    Historical Provider, MD  budesonide-formoterol (SYMBICORT) 80-4.5 MCG/ACT inhaler Inhale 2 puffs into the lungs 2 (two) times daily.    Historical Provider, MD  ciprofloxacin (CIPRO) 500 MG tablet Take 1 tablet (500 mg total) by mouth 2 (two) times daily. Patient not taking: Reported on 02/20/2015 11/23/13   [name], MD  lisinopril-hydrochlorothiazide (PRINZIDE,ZESTORETIC) 20-12.5 MG per tablet Take 1 tablet by mouth daily.    Historical Provider, MD  lovastatin (MEVACOR) 20 MG tablet Take 20 mg by mouth every morning.    Historical Provider, MD  lovastatin (MEVACOR) 20 MG tablet Take 20 mg by mouth at bedtime.    Historical Provider, MD  metroNIDAZOLE (FLAGYL) 500 MG tablet Take 1 tablet (500 mg total) by mouth  every 8 (eight) hours. Patient not taking: Reported on 02/20/2015 11/23/13   [name], MD  ondansetron (ZOFRAN) 4 MG tablet Take 1 tablet (4 mg total) by mouth every 6 (six) hours as needed for nausea. Patient not taking: Reported on 02/20/2015 11/23/13   [name], MD  oxyCODONE (OXY IR/ROXICODONE) 5 MG immediate release tablet Take 1 tablet (5 mg total) by mouth every 4 (four) hours as needed for moderate pain. Patient not taking: Reported on 02/20/2015 11/23/13   [name], MD  Oxycodone HCl 10 MG TABS Take 10 mg by mouth  4 (four) times daily.    Historical Provider, MD  oxyCODONE-acetaminophen (PERCOCET/ROXICET) 5-325 MG per tablet Take 1 tablet by mouth every 6 (six) hours as needed. 02/20/15   Bethann Berkshire, MD  PARoxetine (PAXIL) 20 MG tablet Take 20 mg by mouth daily.    Historical Provider, MD  simethicone (GAS-X) 80 MG chewable tablet Chew 80 mg by mouth every 6 (six) hours as needed for flatulence.    Historical Provider, MD  tetrahydrozoline 0.05 % ophthalmic solution Place 1 drop into both eyes as needed (Dry Eyes).    Historical Provider, MD  verapamil (CALAN) 120 MG tablet Take 120 mg by mouth 2 (two) times daily.    Historical Provider, MD   BP 108/81 mmHg  Pulse 91  Temp(Src) 98.6 F (37 C) (Oral)  Resp 18  Ht  (1.6 m)  Wt 130 lb (58.968 kg)  BMI 23.03 kg/m2  SpO2 96% Physical Exam  Constitutional: She is oriented to person, place, and time. She appears well-developed and well-nourished. No distress.  HENT:  Head: Normocephalic and atraumatic.  Mouth/Throat: Oropharynx is clear and moist.  Eyes: Conjunctivae and EOM are normal. Pupils are equal, round, and reactive to light.  Neck: Normal range of motion. Neck supple. No tracheal deviation present.  Cardiovascular: Normal rate, regular rhythm, normal heart sounds and intact distal pulses.   Pulmonary/Chest: Effort normal and breath sounds normal. No respiratory distress. She has no rales.  Symmetrical rise and fall of the chest; no rales and no rhonchi  Abdominal: Soft.  Musculoskeletal: Normal range of motion.  DP/PT pulses 2+ left foot; cap refill less than 2 seconds; swelling and mild bruising of lateral malleolus; achilles tendon intact; no palpable deformity of foot; tenderness to alteral malleolus; no deformity of tibia; FROM of left knee and hip; patella in midline; moderate crepitus present   No acute abnormality of RUE  LUE in sling; left radial pulse 2+; cap refill less than 2 seconds; no displacement of scapula; no palpable  deformity of clavicle; further examination deferred due to previous hx of fracture  Neurological: She is alert and oriented to person, place, and time.  Skin: Skin is warm and dry.  Psychiatric: She has a normal mood and affect. Her behavior is normal.  Nursing note and vitals reviewed.  ED Course  Procedures  DIAGNOSTIC STUDIES: Oxygen Saturation is 96% on RA, normal by my interpretation.    COORDINATION OF CARE: 4:19 PM Discussed treatment plan with pt at bedside and pt agreed to plan.  Labs Review Labs Reviewed - No data to display  Imaging Review No results found.   EKG Interpretation None      MDM  Vital signs within normal limits. Patient has a history of fracture to the left upper arm and shoulder. Patient is in a sling, the pulses and sensory on the left upper extremity are intact. The x-ray of  the left ankle is negative for fracture or dislocation. No New deformity of the left shoulder. Pt to see Dr Romeo Apple this week for additional evaluation of the left shoulder. ASO and ice provided for the pt and the left ankle.   Final diagnoses:  None    *I have reviewed nursing notes, vital signs, and all appropriate lab and imaging results for this patient.** **I personally performed the services described in this documentation, which was scribed in my presence. The recorded information has been reviewed and is accurate.Ivery Quale, PA-C 03/21/15 1649  Mancel Bale, MD 03/21/15 2351

## 2015-03-22 ENCOUNTER — Telehealth: Payer: Self-pay | Admitting: *Deleted

## 2015-03-22 ENCOUNTER — Other Ambulatory Visit: Payer: Self-pay | Admitting: *Deleted

## 2015-03-22 ENCOUNTER — Telehealth: Payer: Self-pay | Admitting: Orthopedic Surgery

## 2015-03-22 ENCOUNTER — Ambulatory Visit (INDEPENDENT_AMBULATORY_CARE_PROVIDER_SITE_OTHER): Payer: Self-pay | Admitting: Orthopedic Surgery

## 2015-03-22 VITALS — BP 115/74 | Ht 63.0 in | Wt 130.0 lb

## 2015-03-22 DIAGNOSIS — S42202K Unspecified fracture of upper end of left humerus, subsequent encounter for fracture with nonunion: Secondary | ICD-10-CM

## 2015-03-22 DIAGNOSIS — S42201P Unspecified fracture of upper end of right humerus, subsequent encounter for fracture with malunion: Secondary | ICD-10-CM

## 2015-03-22 NOTE — Progress Notes (Signed)
Patient ID: Kathleen Bennett, female   DOB: 1961-01-26, 54 y.o.   MRN: [id number]  Follow up visit  Chief Complaint  Patient presents with  . Wound Check    2 week follow up left proximal humerus fracture, DOI 02/20/15    Ht  (1.6 m)  Wt 130 lb (58.968 kg)  BMI 23.03 kg/m2  Encounter Diagnosis  Name Primary?  . Proximal humerus fracture, right, with malunion, subsequent encounter Yes     CT Scan was obtained left humerus for better definitive fracture identification and classification   The fracture does show significant displacement of the head shaft alignment.    Exam shows ecchymosis in the upper forearm and lower arm but there is no neurovascular deficit including sensation along the lateral deltoid. Hand is moving fine pulses are intact  The patient will most likely need either open reduction internal fixation or hemiarthroplasty with tuberosity repair . I think she has too much osteoporosis to warrant internal fixation. Either way difficult fracture difficult situation  This fracture is not amenable to my skill set and therefore she will be referred to tertiary care center for definitive care

## 2015-03-22 NOTE — Patient Instructions (Addendum)
This patient will be referred to [facility] for definitive care   In the meantime she will continue with shoulder immobilization and take Percocet for pain

## 2015-03-22 NOTE — Telephone Encounter (Signed)
REFERRAL FAXED TO [facility]

## 2015-03-22 NOTE — Telephone Encounter (Signed)
No notes required   Ct reviewed

## 2015-03-28 NOTE — Telephone Encounter (Signed)
Marijean Niemann, Ms. Berkowitz states that she has not heard anything from Kiana yet, I explained to her that it usually does not happen that quickly but I will let you know

## 2015-03-31 NOTE — Telephone Encounter (Signed)
appt 04/01/15 10am, patient aware

## 2015-04-04 NOTE — Telephone Encounter (Signed)
Patient no-showed to appt.

## 2015-04-10 IMAGING — CT CT ABD-PELV W/ CM
2 of 4 series · 15 of 46 positions shown, 17 images · IV contrast (omnipaque)
Comparison: 12/08/2010 ultrasound

CLINICAL DATA: Mid abdominal pain, nausea and vomiting

EXAM:
CT ABDOMEN AND PELVIS WITH CONTRAST
TECHNIQUE: Multidetector CT imaging of the abdomen and pelvis was performed
using the standard protocol following bolus administration of
intravenous contrast.
CONTRAST:  50mL OMNIPAQUE IOHEXOL 300 MG/ML SOLN, 100mL OMNIPAQUE
IOHEXOL 300 MG/ML SOLN

[Series 2: abd_pel_with 5.0 b40f · axial · 0.69mm/px · z∈[-484,-104]mm · 12 of 86 slices shown, 14 images]
[im 5/86  soft-tissue]
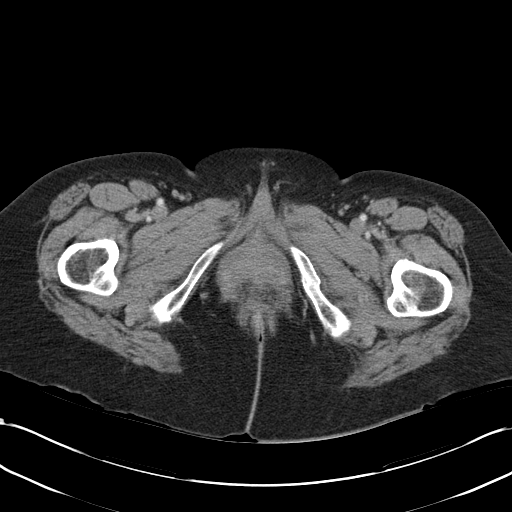
[im 5/86  bone]
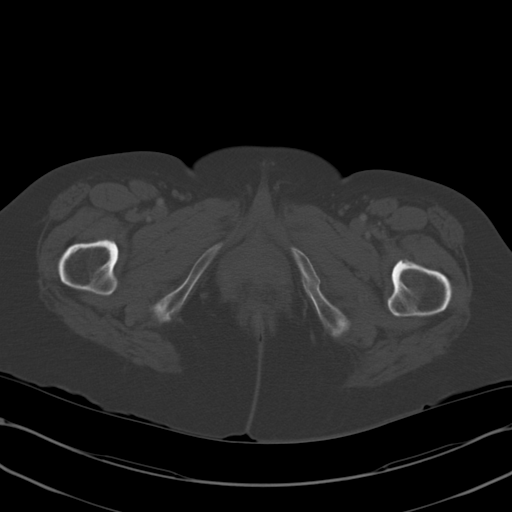
[im 13/86  soft-tissue]
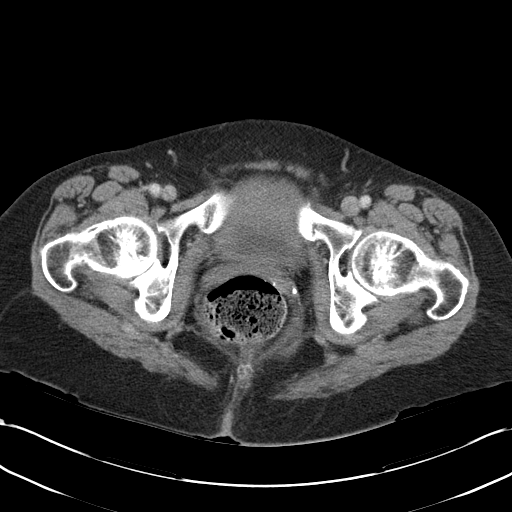
[im 21/86  soft-tissue]
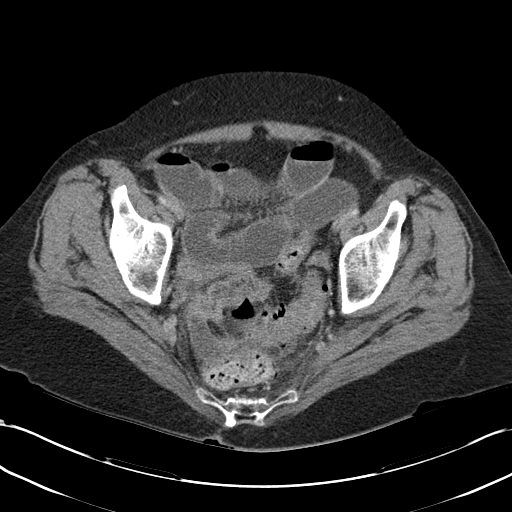
[im 25/86  soft-tissue]
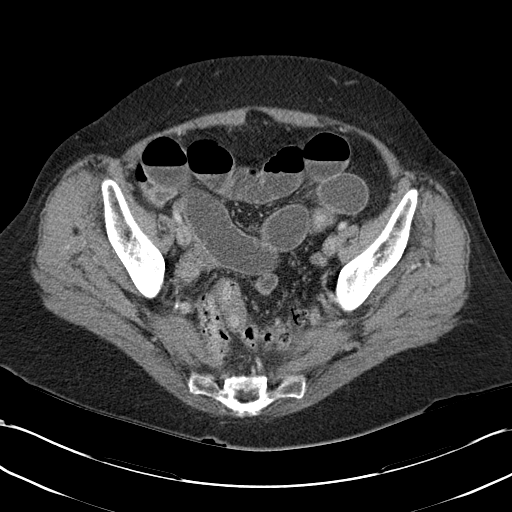
[im 33/86  soft-tissue]
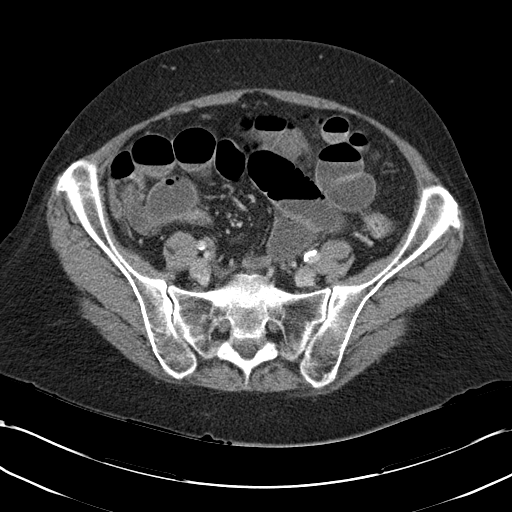
[im 41/86  soft-tissue]
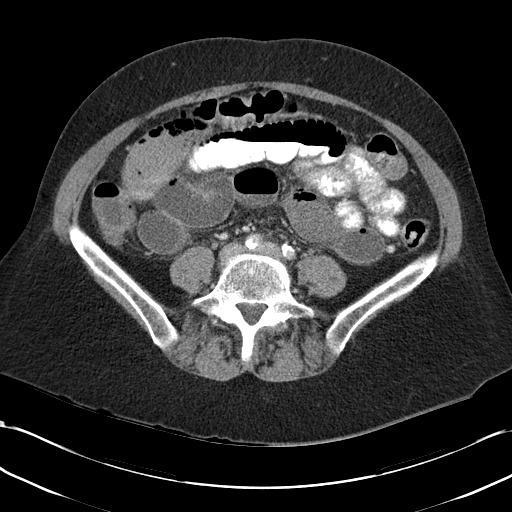
[im 45/86  soft-tissue]
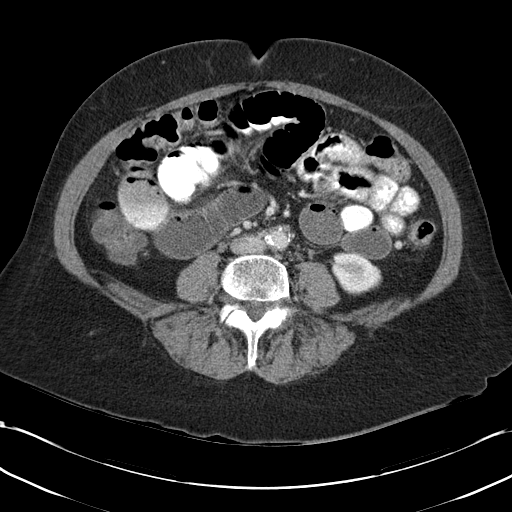
[im 53/86  soft-tissue]
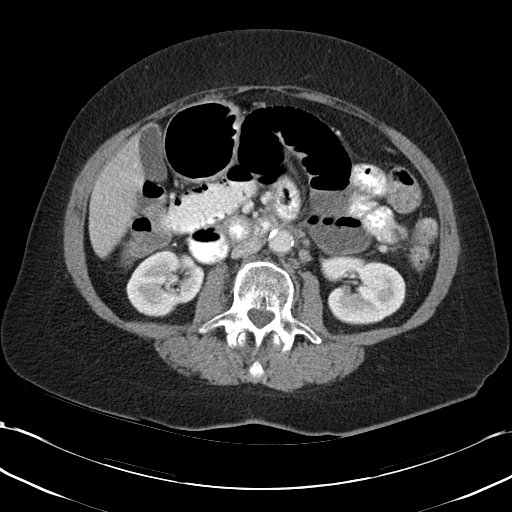
[im 61/86  soft-tissue]
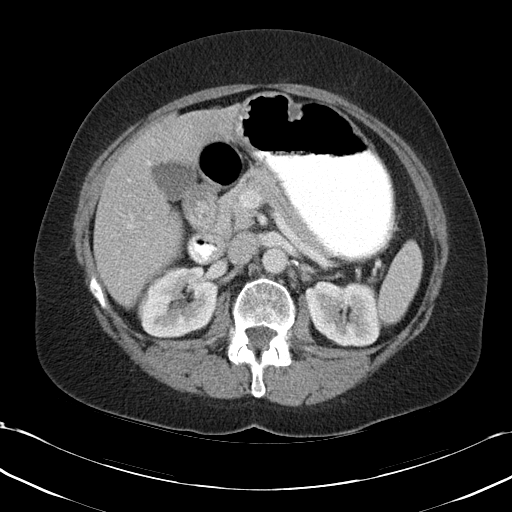
[im 61/86  bone]
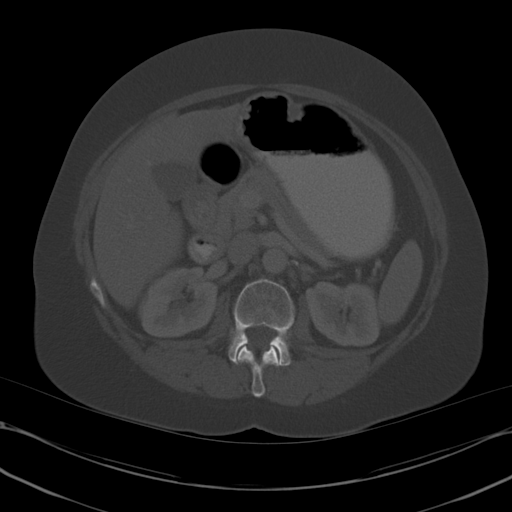
[im 65/86  soft-tissue]
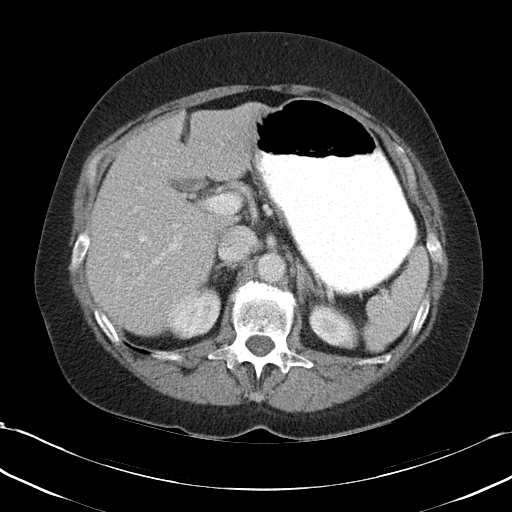
[im 73/86  soft-tissue]
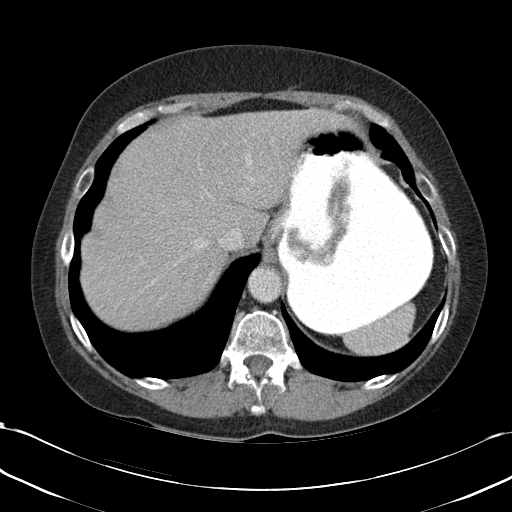
[im 81/86  soft-tissue]
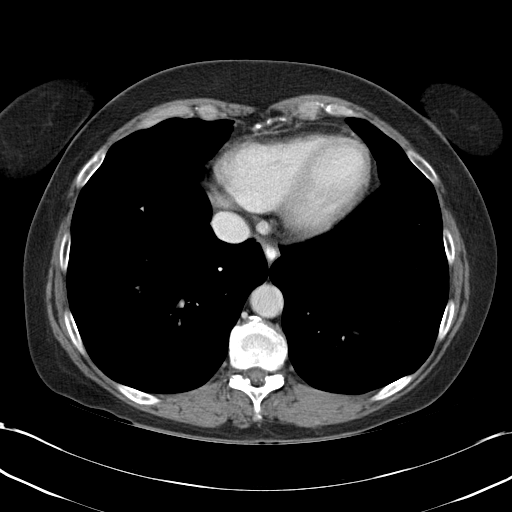

[Series 4: abd_pel_with 3.0 spo cor · coronal · 0.68mm/px · 3 of 88 slices shown]
[im 30/88  soft-tissue]
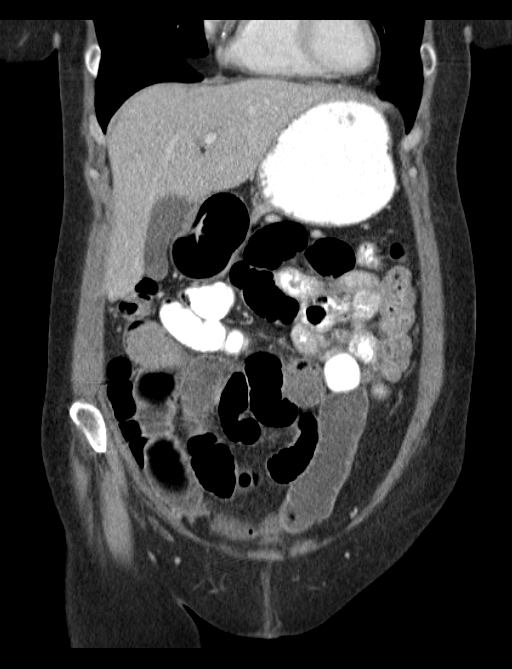
[im 39/88  soft-tissue]
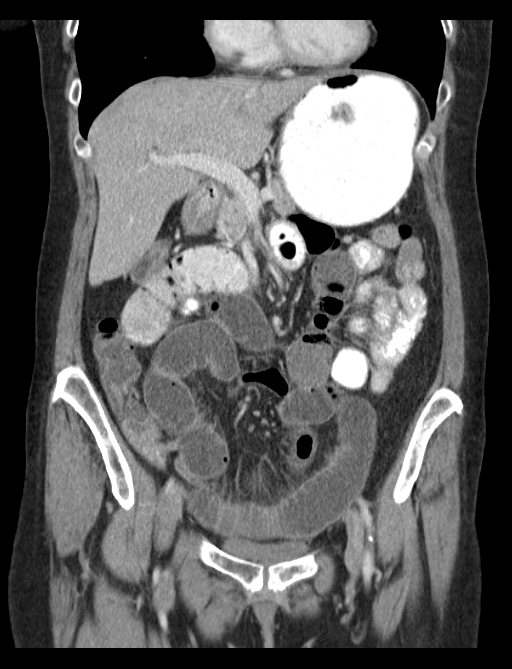
[im 49/88  soft-tissue]
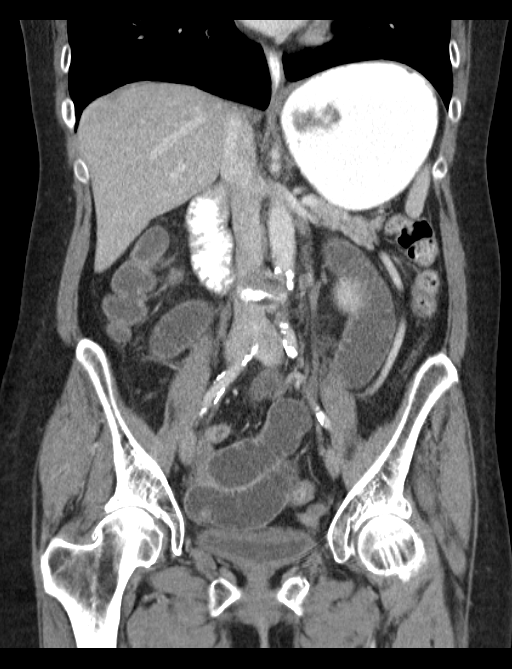

[15 of 46 positions shown; findings below may reference images not displayed]

FINDINGS: Clear lung bases. Normal heart size. No pericardial or pleural
effusion. No hiatal hernia.

Abdomen: Liver, gallbladder, biliary system, pancreas, spleen,
adrenal glands, and kidneys are within normal limits for age and
demonstrate no acute process.

Atherosclerosis of the aorta and iliac vessels without aneurysm or
occlusive process.

No abdominal free fluid, fluid collection, hemorrhage, abscess or
significant adenopathy.

Several loops of dilated jejunum and ileum noted throughout the
lower abdomen and pelvis. No associated pneumatosis. No free air.

Pelvis: The lower sigmoid colon demonstrates diverticular changes,
surrounding inflammation and a small amount of pelvic free fluid
compatible with sigmoid diverticulitis. A small adjacent sigmoid
air-fluid collection is noted measures 24 x 21 mm. This could
represent a small interloop abscess or giant diverticulum. Adjacent
distal ileum appears collapsed with slight wall thickening in the
pelvis, image 66. Suspect this is related to a secondary
inflammatory enteritis with an associated functional small bowel
obstruction from the sigmoid diverticulitis.

Urinary bladder is collapsed. No pelvic adenopathy, inguinal
abnormality, or hernia.

Degenerative changes noted of the spine. Healed left inferior ramus
fracture noted.
IMPRESSION: Lower sigmoid acute diverticulitis within the pelvis. Small adjacent
sigmoid 24 x 21 mm air-fluid collection could represent a giant
diverticulum versus a small adjacent developing abscess. This is not
amenable to CT-guided drainage.

Adjacent pelvic free fluid from the inflammatory process.

Distal ileum wall thickening adjacent to the diverticulitis, suspect
secondary enteritis of the ileum with an associated functional small
bowel obstruction.

No free air or pneumatosis.

These results were called by telephone at the time of interpretation
on 11/21/2013 at [DATE] to Dr. FELNER CEOLA , who verbally
acknowledged these results.

## 2016-08-03 ENCOUNTER — Other Ambulatory Visit (HOSPITAL_COMMUNITY): Payer: Self-pay | Admitting: Family Medicine

## 2016-08-03 DIAGNOSIS — Z1231 Encounter for screening mammogram for malignant neoplasm of breast: Secondary | ICD-10-CM

## 2016-08-08 ENCOUNTER — Ambulatory Visit (HOSPITAL_COMMUNITY)
Admission: RE | Admit: 2016-08-08 | Discharge: 2016-08-08 | Disposition: A | Discharge status: Home / Self Care | Payer: Medicaid Other | Source: Ambulatory Visit | Attending: Family Medicine | Admitting: Family Medicine

## 2016-08-08 DIAGNOSIS — Z1231 Encounter for screening mammogram for malignant neoplasm of breast: Secondary | ICD-10-CM | POA: Diagnosis not present

## 2018-09-08 ENCOUNTER — Other Ambulatory Visit (HOSPITAL_COMMUNITY): Payer: Self-pay | Admitting: Family Medicine

## 2018-09-08 DIAGNOSIS — Z1231 Encounter for screening mammogram for malignant neoplasm of breast: Secondary | ICD-10-CM

## 2018-09-12 ENCOUNTER — Ambulatory Visit (HOSPITAL_COMMUNITY)
Admission: RE | Admit: 2018-09-12 | Discharge: 2018-09-12 | Disposition: A | Discharge status: Home / Self Care | Payer: Medicaid Other | Source: Ambulatory Visit | Attending: Family Medicine | Admitting: Family Medicine

## 2018-09-12 ENCOUNTER — Encounter (HOSPITAL_COMMUNITY): Payer: Self-pay

## 2018-09-12 DIAGNOSIS — Z1231 Encounter for screening mammogram for malignant neoplasm of breast: Secondary | ICD-10-CM | POA: Diagnosis present

## 2019-11-12 ENCOUNTER — Other Ambulatory Visit (HOSPITAL_COMMUNITY): Payer: Self-pay | Admitting: Family Medicine

## 2019-11-12 DIAGNOSIS — Z1231 Encounter for screening mammogram for malignant neoplasm of breast: Secondary | ICD-10-CM

## 2019-12-24 ENCOUNTER — Ambulatory Visit (HOSPITAL_COMMUNITY)
Admission: RE | Admit: 2019-12-24 | Discharge: 2019-12-24 | Disposition: A | Discharge status: Home / Self Care | Payer: Medicaid Other | Source: Ambulatory Visit | Attending: Family Medicine | Admitting: Family Medicine

## 2019-12-24 ENCOUNTER — Other Ambulatory Visit: Payer: Self-pay

## 2019-12-24 DIAGNOSIS — Z1231 Encounter for screening mammogram for malignant neoplasm of breast: Secondary | ICD-10-CM | POA: Diagnosis not present

## 2020-11-01 ENCOUNTER — Observation Stay (HOSPITAL_COMMUNITY)
Admission: EM | Admit: 2020-11-01 | Discharge: 2020-11-02 | Disposition: A | Discharge status: Home / Self Care | Payer: Medicaid Other | Attending: Emergency Medicine | Admitting: Emergency Medicine

## 2020-11-01 ENCOUNTER — Emergency Department (HOSPITAL_COMMUNITY): Payer: Medicaid Other

## 2020-11-01 ENCOUNTER — Encounter (HOSPITAL_COMMUNITY): Payer: Self-pay | Admitting: *Deleted

## 2020-11-01 ENCOUNTER — Other Ambulatory Visit: Payer: Self-pay

## 2020-11-01 DIAGNOSIS — R0600 Dyspnea, unspecified: Secondary | ICD-10-CM | POA: Diagnosis present

## 2020-11-01 DIAGNOSIS — Z720 Tobacco use: Secondary | ICD-10-CM

## 2020-11-01 DIAGNOSIS — F17210 Nicotine dependence, cigarettes, uncomplicated: Secondary | ICD-10-CM | POA: Diagnosis not present

## 2020-11-01 DIAGNOSIS — D72829 Elevated white blood cell count, unspecified: Secondary | ICD-10-CM

## 2020-11-01 DIAGNOSIS — Z20822 Contact with and (suspected) exposure to covid-19: Secondary | ICD-10-CM | POA: Insufficient documentation

## 2020-11-01 DIAGNOSIS — I10 Essential (primary) hypertension: Secondary | ICD-10-CM | POA: Diagnosis not present

## 2020-11-01 DIAGNOSIS — R0781 Pleurodynia: Secondary | ICD-10-CM

## 2020-11-01 DIAGNOSIS — R718 Other abnormality of red blood cells: Secondary | ICD-10-CM | POA: Insufficient documentation

## 2020-11-01 DIAGNOSIS — N179 Acute kidney failure, unspecified: Secondary | ICD-10-CM

## 2020-11-01 DIAGNOSIS — Z79899 Other long term (current) drug therapy: Secondary | ICD-10-CM | POA: Insufficient documentation

## 2020-11-01 DIAGNOSIS — Z23 Encounter for immunization: Secondary | ICD-10-CM | POA: Diagnosis not present

## 2020-11-01 DIAGNOSIS — R739 Hyperglycemia, unspecified: Secondary | ICD-10-CM

## 2020-11-01 DIAGNOSIS — E860 Dehydration: Secondary | ICD-10-CM | POA: Diagnosis not present

## 2020-11-01 DIAGNOSIS — E782 Mixed hyperlipidemia: Secondary | ICD-10-CM

## 2020-11-01 DIAGNOSIS — J9601 Acute respiratory failure with hypoxia: Secondary | ICD-10-CM

## 2020-11-01 DIAGNOSIS — E785 Hyperlipidemia, unspecified: Secondary | ICD-10-CM

## 2020-11-01 DIAGNOSIS — J441 Chronic obstructive pulmonary disease with (acute) exacerbation: Principal | ICD-10-CM | POA: Insufficient documentation

## 2020-11-01 DIAGNOSIS — E871 Hypo-osmolality and hyponatremia: Secondary | ICD-10-CM | POA: Insufficient documentation

## 2020-11-01 DIAGNOSIS — K219 Gastro-esophageal reflux disease without esophagitis: Secondary | ICD-10-CM

## 2020-11-01 LAB — CBC WITH DIFFERENTIAL/PLATELET
Abs Immature Granulocytes: 0.09 10*3/uL — ABNORMAL HIGH (ref 0.00–0.07)
Basophils Absolute: 0 10*3/uL (ref 0.0–0.1)
Basophils Relative: 0 %
Eosinophils Absolute: 0 10*3/uL (ref 0.0–0.5)
Eosinophils Relative: 0 %
HCT: 41 % (ref 36.0–46.0)
Hemoglobin: 14.4 g/dL (ref 12.0–15.0)
Immature Granulocytes: 1 %
Lymphocytes Relative: 4 %
Lymphs Abs: 0.6 10*3/uL — ABNORMAL LOW (ref 0.7–4.0)
MCH: 36.1 pg — ABNORMAL HIGH (ref 26.0–34.0)
MCHC: 35.1 g/dL (ref 30.0–36.0)
MCV: 102.8 fL — ABNORMAL HIGH (ref 80.0–100.0)
Monocytes Absolute: 0.4 10*3/uL (ref 0.1–1.0)
Monocytes Relative: 3 %
Neutro Abs: 14.2 10*3/uL — ABNORMAL HIGH (ref 1.7–7.7)
Neutrophils Relative %: 92 %
Platelets: 286 10*3/uL (ref 150–400)
RBC: 3.99 MIL/uL (ref 3.87–5.11)
RDW: 14.2 % (ref 11.5–15.5)
WBC: 15.3 10*3/uL — ABNORMAL HIGH (ref 4.0–10.5)
nRBC: 0 % (ref 0.0–0.2)

## 2020-11-01 LAB — COMPREHENSIVE METABOLIC PANEL
ALT: 19 U/L (ref 0–44)
AST: 31 U/L (ref 15–41)
Albumin: 4.4 g/dL (ref 3.5–5.0)
Alkaline Phosphatase: 52 U/L (ref 38–126)
Anion gap: 17 — ABNORMAL HIGH (ref 5–15)
BUN: 25 mg/dL — ABNORMAL HIGH (ref 8–23)
CO2: 23 mmol/L (ref 22–32)
Calcium: 10.4 mg/dL — ABNORMAL HIGH (ref 8.9–10.3)
Chloride: 89 mmol/L — ABNORMAL LOW (ref 98–111)
Creatinine, Ser: 1.75 mg/dL — ABNORMAL HIGH (ref 0.44–1.00)
GFR, Estimated: 33 mL/min — ABNORMAL LOW (ref >60)
Glucose, Bld: 156 mg/dL — ABNORMAL HIGH (ref 70–99)
Potassium: 3.5 mmol/L (ref 3.5–5.1)
Sodium: 129 mmol/L — ABNORMAL LOW (ref 135–145)
Total Bilirubin: 0.4 mg/dL (ref 0.3–1.2)
Total Protein: 7.7 g/dL (ref 6.5–8.1)

## 2020-11-01 LAB — TROPONIN I (HIGH SENSITIVITY)
Troponin I (High Sensitivity): 6 ng/L (ref <18)
Troponin I (High Sensitivity): 6 ng/L (ref <18)

## 2020-11-01 LAB — RESP PANEL BY RT-PCR (FLU A&B, COVID) ARPGX2
Influenza A by PCR: NEGATIVE
Influenza B by PCR: NEGATIVE
SARS Coronavirus 2 by RT PCR: NEGATIVE

## 2020-11-01 MED ORDER — METHYLPREDNISOLONE SODIUM SUCC 40 MG IJ SOLR
40.0000 mg | Freq: Two times a day (BID) | INTRAMUSCULAR | Status: DC
  Administered 2020-11-01: 40 mg via INTRAVENOUS
  Filled 2020-11-01: qty 1

## 2020-11-01 MED ORDER — INFLUENZA VAC SPLIT QUAD 0.5 ML IM SUSY
0.5000 mL | PREFILLED_SYRINGE | INTRAMUSCULAR | Status: AC
  Administered 2020-11-02: 0.5 mL via INTRAMUSCULAR
  Filled 2020-11-01: qty 0.5

## 2020-11-01 MED ORDER — SODIUM CHLORIDE 0.9 % IV SOLN: Freq: Once | INTRAVENOUS | Status: AC

## 2020-11-01 MED ORDER — IPRATROPIUM-ALBUTEROL 0.5-2.5 (3) MG/3ML IN SOLN
3.0000 mL | Freq: Once | RESPIRATORY_TRACT | Status: AC
  Administered 2020-11-01: 3 mL via RESPIRATORY_TRACT
  Filled 2020-11-01: qty 3

## 2020-11-01 MED ORDER — ACETAMINOPHEN 325 MG PO TABS
650.0000 mg | ORAL_TABLET | Freq: Once | ORAL | Status: AC
  Administered 2020-11-01: 650 mg via ORAL
  Filled 2020-11-01: qty 2

## 2020-11-01 MED ORDER — AZITHROMYCIN 250 MG PO TABS
250.0000 mg | ORAL_TABLET | Freq: Every day | ORAL | Status: DC
  Administered 2020-11-02: 250 mg via ORAL
  Filled 2020-11-01: qty 1

## 2020-11-01 MED ORDER — AEROCHAMBER PLUS FLO-VU MEDIUM MISC
1.0000 | Freq: Once | Status: AC
  Administered 2020-11-01: 1
  Filled 2020-11-01: qty 1

## 2020-11-01 MED ORDER — SODIUM CHLORIDE 0.9 % IV BOLUS
500.0000 mL | Freq: Once | INTRAVENOUS | Status: AC
  Administered 2020-11-01: 500 mL via INTRAVENOUS

## 2020-11-01 MED ORDER — ALBUTEROL SULFATE HFA 108 (90 BASE) MCG/ACT IN AERS
6.0000 | INHALATION_SPRAY | Freq: Once | RESPIRATORY_TRACT | Status: AC
  Administered 2020-11-01: 6 via RESPIRATORY_TRACT
  Filled 2020-11-01: qty 6.7

## 2020-11-01 MED ORDER — IPRATROPIUM-ALBUTEROL 0.5-2.5 (3) MG/3ML IN SOLN: 3.0000 mL | RESPIRATORY_TRACT | Status: DC | PRN

## 2020-11-01 MED ORDER — ACETAMINOPHEN 325 MG PO TABS
650.0000 mg | ORAL_TABLET | Freq: Four times a day (QID) | ORAL | Status: DC | PRN
  Administered 2020-11-02: 650 mg via ORAL
  Filled 2020-11-01: qty 2

## 2020-11-01 MED ORDER — LIDOCAINE 5 % EX PTCH
1.0000 | MEDICATED_PATCH | CUTANEOUS | Status: DC
  Administered 2020-11-01: 1 via TRANSDERMAL
  Filled 2020-11-01: qty 1

## 2020-11-01 MED ORDER — DM-GUAIFENESIN ER 30-600 MG PO TB12
1.0000 | ORAL_TABLET | Freq: Two times a day (BID) | ORAL | Status: DC
  Administered 2020-11-01 – 2020-11-02 (×2): 1 via ORAL
  Filled 2020-11-01 (×2): qty 1

## 2020-11-01 MED ORDER — IPRATROPIUM-ALBUTEROL 0.5-2.5 (3) MG/3ML IN SOLN: 3.0000 mL | Freq: Four times a day (QID) | RESPIRATORY_TRACT | Status: DC

## 2020-11-01 MED ORDER — IPRATROPIUM-ALBUTEROL 0.5-2.5 (3) MG/3ML IN SOLN
3.0000 mL | Freq: Four times a day (QID) | RESPIRATORY_TRACT | Status: DC
  Administered 2020-11-02 (×2): 3 mL via RESPIRATORY_TRACT
  Filled 2020-11-01 (×2): qty 3

## 2020-11-01 MED ORDER — MAGNESIUM SULFATE 2 GM/50ML IV SOLN
2.0000 g | Freq: Once | INTRAVENOUS | Status: AC
  Administered 2020-11-01: 2 g via INTRAVENOUS
  Filled 2020-11-01: qty 50

## 2020-11-01 MED ORDER — ALBUTEROL (5 MG/ML) CONTINUOUS INHALATION SOLN
10.0000 mg/h | INHALATION_SOLUTION | Freq: Once | RESPIRATORY_TRACT | Status: AC
  Administered 2020-11-01: 10 mg/h via RESPIRATORY_TRACT
  Filled 2020-11-01: qty 20

## 2020-11-01 MED ORDER — AZITHROMYCIN 250 MG PO TABS
500.0000 mg | ORAL_TABLET | Freq: Every day | ORAL | Status: AC
  Administered 2020-11-01: 500 mg via ORAL
  Filled 2020-11-01: qty 2

## 2020-11-01 MED ORDER — IPRATROPIUM-ALBUTEROL 20-100 MCG/ACT IN AERS
2.0000 | INHALATION_SPRAY | Freq: Once | RESPIRATORY_TRACT | Status: AC
  Administered 2020-11-01: 2 via RESPIRATORY_TRACT

## 2020-11-01 MED ORDER — METHYLPREDNISOLONE SODIUM SUCC 125 MG IJ SOLR
125.0000 mg | Freq: Once | INTRAMUSCULAR | Status: AC
  Administered 2020-11-01: 125 mg via INTRAVENOUS
  Filled 2020-11-01: qty 2

## 2020-11-01 NOTE — ED Provider Notes (Signed)
[facility] EMERGENCY DEPARTMENT Provider Note   CSN: [id number] Arrival date & time: 11/01/20  1326     History Chief Complaint  Patient presents with  . Shortness of Breath    Kathleen Bennett is a 60 y.o. female with a hx of COPD, hypertension, hypercholesterolemia, anxiety, & GERD who presents to the ED with complaints of dyspnea x 1 week. Patient reports waxing/waning dyspnea x 1 week, episodes of significant increased dyspnea with wheezing & coughing spells that are mildly alleviated by rescue inhaler, but episodes of increasing in frequency. Reports cough productive of yellow/green phlegm sputum. Also notes left lower lateral chest pain, constant, worse with palpation/cough/deep breaths. Denies fever, chills, syncope, or leg swelling. She does mention left lower extremity discomfort intermittently x 1 month.  Denies prior VTE, hormone therapy, or recent travel/hospitalization/surgery/trauma. Patient does not wear oxygen at home. Saw PCP yesterday- started on 40 mg of prednisone daily & Levaquin- sxs worse, called PCP and was told to come to the ED.   HPI     Past Medical History:  Diagnosis Date  . Anxiety   . COPD (chronic obstructive pulmonary disease) (HCC)   . DDD (degenerative disc disease), lumbar   . Diverticulitis   . GERD (gastroesophageal reflux disease)   . High cholesterol   . Hypertension     Patient Active Problem List   Diagnosis Date Noted  . Acute diverticulitis 11/21/2013  . Diverticulitis 11/21/2013    Past Surgical History:  Procedure Laterality Date  . ABDOMINAL HYSTERECTOMY       OB History   No obstetric history on file.     No family history on file.  Social History   Tobacco Use  . Smoking status: Current Every Day Smoker    Packs/day: 1.50    Years: 40.00    Pack years: 60.00    Types: Cigarettes  . Smokeless tobacco: Never Used  Substance Use Topics  . Alcohol use: Yes    Comment: beer daily x 6  . Drug use: No    Home  Medications Prior to Admission medications   Medication Sig Start Date End Date Taking? Authorizing Provider  albuterol (PROVENTIL HFA;VENTOLIN HFA) 108 (90 BASE) MCG/ACT inhaler Inhale 2 puffs into the lungs every 6 (six) hours as needed.    [provider]  albuterol (PROVENTIL) (2.5 MG/3ML) 0.083% nebulizer solution Take 2.5 mg by nebulization every 6 (six) hours as needed.    [provider]  budesonide-formoterol (SYMBICORT) 80-4.5 MCG/ACT inhaler Inhale 2 puffs into the lungs 2 (two) times daily.    [provider]  ciprofloxacin (CIPRO) 500 MG tablet Take 1 tablet (500 mg total) by mouth 2 (two) times daily. Patient not taking: Reported on 02/20/2015 11/23/13   [name], MD  lisinopril-hydrochlorothiazide (PRINZIDE,ZESTORETIC) 20-12.5 MG per tablet Take 1 tablet by mouth daily.    [provider]  lovastatin (MEVACOR) 20 MG tablet Take 20 mg by mouth every morning.    [provider]  lovastatin (MEVACOR) 20 MG tablet Take 20 mg by mouth at bedtime.    [provider]  metroNIDAZOLE (FLAGYL) 500 MG tablet Take 1 tablet (500 mg total) by mouth every 8 (eight) hours. Patient not taking: Reported on 02/20/2015 11/23/13   [name], MD  ondansetron (ZOFRAN) 4 MG tablet Take 1 tablet (4 mg total) by mouth every 6 (six) hours as needed for nausea. Patient not taking: Reported on 02/20/2015 11/23/13   [name], MD  oxyCODONE (OXY IR/ROXICODONE) 5 MG immediate release tablet Take 1 tablet (5 mg total) by mouth every 4 (four) hours as needed for moderate pain. Patient not taking: Reported on 02/20/2015 11/23/13   Dorothea OgleMyers, Iskra M, MD  Oxycodone HCl 10 MG TABS Take 10 mg by mouth 4 (four) times daily.    [provider]  oxyCODONE-acetaminophen (PERCOCET/ROXICET) 5-325 MG per tablet Take 1 tablet by mouth every 6 (six) hours as needed. 02/20/15   Bethann BerkshireZammit, Joseph, MD  PARoxetine (PAXIL) 20 MG tablet Take 20 mg by mouth daily.    [provider]  simethicone (GAS-X) 80 MG chewable tablet Chew 80 mg by mouth every 6 (six) hours as needed for flatulence.    [provider]  tetrahydrozoline 0.05 % ophthalmic solution Place 1 drop into both eyes as needed (Dry Eyes).    [provider]  verapamil (CALAN) 120 MG tablet Take 120 mg by mouth 2 (two) times daily.    [provider]    Allergies    Codeine  Review of Systems   Review of Systems  Constitutional: Negative for fever.  Respiratory: Positive for cough, chest tightness, shortness of breath and wheezing.   Cardiovascular: Positive for chest pain. Negative for leg swelling.  Gastrointestinal: Negative for abdominal pain.  Musculoskeletal: Positive for myalgias.  Neurological: Negative for syncope.  All other systems reviewed and are negative.   Physical Exam Updated Vital Signs BP (!) 154/79 (BP Location: Right Arm)   Pulse (!) 114   Temp 97.8 F (36.6 C) (Oral)   Resp 20   SpO2 (!) 82%   Physical Exam Vitals and nursing note reviewed.  Constitutional:      General: She is not in acute distress.    Appearance: She is well-developed. She is not toxic-appearing.  HENT:     Head: Normocephalic and atraumatic.  Eyes:     General:        Right eye: No discharge.        Left eye: No discharge.     Conjunctiva/sclera: Conjunctivae normal.     Pupils: Pupils are equal, round, and reactive to light.  Cardiovascular:     Rate and Rhythm: Regular rhythm. Tachycardia present.  Pulmonary:     Breath sounds: Wheezing (Expiratory throughout) present. No rhonchi or rales.     Comments: Poor air movement.  Mildly tachypneic.  Requiring oxygen via nasal cannula as she desaturated into the 80s on room air. Chest:     Chest wall: Tenderness (left lower lateral chest wall) present.  Abdominal:     General: There is no distension.     Palpations: Abdomen is soft.     Tenderness: There is no abdominal tenderness. There is no guarding or  rebound.  Musculoskeletal:     Cervical back: Neck supple.     Right lower leg: No edema.     Left lower leg: No edema.     Comments: Patient able to actively range all major joints of the lower extremities.  No significant edema or rashes.  Left calf tenderness palpation.  Skin:    General: Skin is warm and dry.     Findings: No rash.  Neurological:     Mental Status: She is alert.     Comments: Clear speech.  Sensation grossly intact bilateral lower extremities.  5 out of 5 strength with plantar dorsiflexion bilaterally.  Psychiatric:        Behavior: Behavior normal.    ED Results /  Procedures / Treatments   Labs (all labs ordered are listed, but only abnormal results are displayed) Labs Reviewed  COMPREHENSIVE METABOLIC PANEL - Abnormal; Notable for the following components:      Result Value   Sodium 129 (*)    Chloride 89 (*)    Glucose, Bld 156 (*)    BUN 25 (*)    Creatinine, Ser 1.75 (*)    Calcium 10.4 (*)    GFR, Estimated 33 (*)    Anion gap 17 (*)    All other components within normal limits  CBC WITH DIFFERENTIAL/PLATELET - Abnormal; Notable for the following components:   WBC 15.3 (*)    MCV 102.8 (*)    MCH 36.1 (*)    Neutro Abs 14.2 (*)    Lymphs Abs 0.6 (*)    Abs Immature Granulocytes 0.09 (*)    All other components within normal limits  RESP PANEL BY RT-PCR (FLU A&B, COVID) ARPGX2  TROPONIN I (HIGH SENSITIVITY)  TROPONIN I (HIGH SENSITIVITY)    EKG EKG Interpretation  Date/Time:  Tuesday November 01 2020 13:49:57 EDT Ventricular Rate:  104 PR Interval:    QRS Duration: 93 QT Interval:  348 QTC Calculation: 458 R Axis:   39 Text Interpretation: Sinus tachycardia Probable left atrial enlargement Borderline low voltage, extremity leads RSR' in V1 or V2, right VCD or RVH Interpretation limited secondary to artifact Confirmed by Vanetta Mulders [phone]) on 11/01/2020 2:30:06 PM   Radiology US Venous Img Lower  Left (DVT Study)  Result Date:  11/01/2020 CLINICAL DATA:  Left calf pain EXAM: LEFT LOWER EXTREMITY VENOUS DOPPLER ULTRASOUND TECHNIQUE: Gray-scale sonography with graded compression, as well as color Doppler and duplex ultrasound were performed to evaluate the lower extremity deep venous systems from the level of the common femoral vein and including the common femoral, femoral, profunda femoral, popliteal and calf veins including the posterior tibial, peroneal and gastrocnemius veins when visible. The superficial great saphenous vein was also interrogated. Spectral Doppler was utilized to evaluate flow at rest and with distal augmentation maneuvers in the common femoral, femoral and popliteal veins. COMPARISON:  None. FINDINGS: Contralateral Common Femoral Vein: Respiratory phasicity is normal and symmetric with the symptomatic side. No evidence of thrombus. Normal compressibility. Common Femoral Vein: No evidence of thrombus. Normal compressibility, respiratory phasicity and response to augmentation. Saphenofemoral Junction: No evidence of thrombus. Normal compressibility and flow on color Doppler imaging. Profunda Femoral Vein: No evidence of thrombus. Normal compressibility and flow on color Doppler imaging. Femoral Vein: No evidence of thrombus. Normal compressibility, respiratory phasicity and response to augmentation. Popliteal Vein: No evidence of thrombus. Normal compressibility, respiratory phasicity and response to augmentation. Calf Veins: No evidence of thrombus. Normal compressibility and flow on color Doppler imaging. Superficial Great Saphenous Vein: No evidence of thrombus. Normal compressibility. Venous Reflux:  None. Other Findings:  None. IMPRESSION: No evidence of left lower extremity 5 deep venous thrombosis. Electronically Signed   By: Duanne Guess D.O.   On: 11/01/2020 15:04   DG Chest Port 1 View  Result Date: 11/01/2020 CLINICAL DATA:  Short of breath EXAM: PORTABLE CHEST 1 VIEW COMPARISON:  11/21/2013 FINDINGS:  Heart size and vascularity normal. Atherosclerotic calcification aortic arch. Prominent lung markings suggesting COPD. No acute infiltrate or effusion. Sclerotic irregularity left humeral head  due to healing fracture. Question free air under the right hemidiaphragm. IMPRESSION: No acute cardiopulmonary abnormality Healing fracture left humerus Possible intraperitoneal air. Recommend two view abdomen. Left lateral decubitus view also  may be helpful. Electronically Signed   By: Marlan Palau M.D.   On: 11/01/2020 14:47   DG Abd 2 Views  Result Date: 11/01/2020 CLINICAL DATA:  Shortness of breath. EXAM: ABDOMEN - 2 VIEW COMPARISON:  None. FINDINGS: The bowel gas pattern is normal with air-filled loops of large and small bowel seen throughout the abdomen. These extend to involve the regions below the right and left hemidiaphragm. A large amount of stool is seen within the ascending colon. No radio-opaque calculi or other significant radiographic abnormality is seen. Small phleboliths are seen within the pelvis. IMPRESSION: 1.   Large stool burden without evidence of bowel obstruction. 2. Further evaluation of the abdomen pelvis CT is recommended if intra-abdominal free air remains of clinical concern. Electronically Signed   By: Aram Candela M.D.   On: 11/01/2020 15:55    Procedures .Critical Care Performed by: Cherly Anderson, PA-C Authorized by: Cherly Anderson, PA-C    CRITICAL CARE Performed by: Harvie Heck   Total critical care time: 35 minutes  Critical care time was exclusive of separately billable procedures and treating other patients.  Critical care was necessary to treat or prevent imminent or life-threatening deterioration.  Critical care was time spent personally by me on the following activities: development of treatment plan with patient and/or surrogate as well as nursing, discussions with consultants, evaluation of patient's response to treatment,  examination of patient, obtaining history from patient or surrogate, ordering and performing treatments and interventions, ordering and review of laboratory studies, ordering and review of radiographic studies, pulse oximetry and re-evaluation of patient's condition.    Medications Ordered in ED Medications - No data to display  ED Course  I have reviewed the triage vital signs and the nursing notes.  Pertinent labs & imaging results that were available during my care of the patient were reviewed by me and considered in my medical decision making (see chart for details).    MDM Rules/Calculators/A&P                         Patient presents to the ED with complaints of dyspnea. Started on prednisone and Levaqui yesterday by PCP but has continued to worsen.  On arrival patient is hypoxic and mildly tachycardic.  She has some left chest wall tenderness palpation which reproduces her chest pain.  She also mentions some left calf discomfort over the past 1 month intermittently.   Additional history obtained:  Additional history obtained from chart review & nursing note review.   QZE:SPQZ tachycardia Probable left atrial enlargement Borderline low voltage, extremity leads RSR' in V1 or V2, right VCD or RVH Interpretation limited secondary to artifact  Lab Tests:  I Ordered, reviewed, and interpreted labs, which included:  CBC: Leukocytosis of 15.3 CMP: Creatinine is elevated compared to prior however most recent labs on record are from 6 years ago Troponin: No significant elevation, flat COVID/flu: negative  Imaging Studies ordered:  I ordered imaging studies which included CXR, abdominal x-ray, and LLE venous duplex, I independently reviewed, formal radiology impression shows:  CXR: No acute cardiopulmonary abnormality Healing fracture left humerus Possible intraperitoneal air. Recommend two view abdomen. Left lateral decubitus view also may be helpful  Abdominal x-ray:1.   Large stool  burden without evidence of bowel obstruction. 2. Further evaluation of the abdomen pelvis CT is recommended if intra-abdominal free air remains of clinical concern-- abdomen nontender.   LE venous duplex- negative.   ED Course:  Following initial assessment, combivent and additional albuterol with a spacer was ordered pending COVID testing for subsequent neb txs, steroids ordered as well.   Covid has returned negative therefore duoneb ordered.  Abnormal creatinine, most recent on record is from 6 years ago, will give small fluid bolus.   16:50: RE-EVAL: Persistent expiratory wheezing. Still feels a bit short of breath @ rest. Continuous neb ordered.   19:30: RE-EVAL: Patient remains with expiratory wheezing.  Once supple oxygen was removed she desaturated to 87% on room air at rest, oxygen reapplied via nasal cannula, will place order for magnesium and discussed with hospitalist service for admission.  08:00: CONSULT: Discussed with hospitalist Dr. Thomes Dinning- accepts admission.   Portions of this note were generated with Scientist, clinical (histocompatibility and immunogenetics). Dictation errors may occur despite best attempts at proofreading.  Final Clinical Impression(s) / ED Diagnoses Final diagnoses:  Dyspnea  COPD exacerbation (HCC)  Acute hypoxemic respiratory failure [facility])    Rx / DC Orders ED Discharge Orders    None       Desmond Lope 11/01/20 2003    Maia Plan, MD 11/02/20 1145

## 2020-11-01 NOTE — ED Triage Notes (Signed)
patietn oxygen sat 82% on room air

## 2020-11-01 NOTE — ED Triage Notes (Signed)
C/o shortness of breath onset yesterday, worse today

## 2020-11-01 NOTE — Progress Notes (Signed)
Patient had bag of medications with her on admission. Home medications added to home medication bag for storage in pharmacy per protocol. Stored medications by pharmacy form completed. Count for oxycodone bottle verified by charge nurse and [facility] and noted on medication inventory list on the storage bag sent to pharmacy. Patient signed form consenting to storage in pharmacy until discharge.

## 2020-11-01 NOTE — ED Notes (Signed)
Placed on 02 at 4 liters    per minute

## 2020-11-01 NOTE — H&P (Signed)
History and Physical  CLARIVEL CALLAWAY EXH:[id number] DOB: 03/23/1961 DOA: 11/01/2020  Referring physician: Cherly Anderson, PA-C PCP: Oval Linsey, MD  Patient coming from: Home  Chief Complaint: Shortness of Breath  HPI: Kathleen Bennett is a 60 y.o. female with medical history significant for COPD, hypertension, hypercholesterolemia, tobacco abuse & GERD who presents to the ED with complaint of 1 week onset of shortness of breath, wheezing, cough with production of yellow/green phlegm.  Symptoms worsen at night with coughing spells.  She complained of 1 month onset of intermittent left lateral pleuritic pain which has worsened since onset of above symptoms.  Home rescue inhaler only mildly alleviate symptoms, she followed up with her PCP yesterday and was prescribed with prednisone (40 mg daily) and Levaquin, but patient complained of worsening symptoms, she called her PCP who asked her to go to the ED for further evaluation and management.  She continues to smoke cigarettes with the last tobacco use being around 11:30 AM today.  She complained of left leg soreness, but denies fever, chills, headache, numbness or tingling.  ED Course:  In the emergency department, she was intermittently tachypneic and tachycardic.  O2 sat on arrival to the ED was 82% on room air, so she was placed on supplemental oxygen via [place] at 3LPM with improved O2 sats to 92-100%.  Work-up in the ED showed leukocytosis, elevated MCV at 102.8, hyponatremia, hyperglycemia, BUN to creatinine 25/1.75 (most recent lab for comparison was 6 years ago with creatinine of 0.74), troponin was flat at 6.  Calcium 10.4.  SARS coronavirus 2 was negative. Chest x-ray showed no acute cardiopulmonary abnormality and showed possible intraperitoneal air. Abdominal x-ray showed large stool burden without evidence of bowel obstruction Left lower extremity venous Doppler ultrasound showed no evidence of DVT Breathing treatment was  provided, IV Solu-Medrol was given, patient was provided with IV hydration.  Hospitalist was asked to admit patient for further evaluation and management.  Review of Systems: Constitutional: Negative for chills and fever.  HENT: Negative for ear pain and sore throat.   Eyes: Negative for pain and visual disturbance.  Respiratory: Positive for cough, chest tightness, shortness of breath and shortness of breath.   Cardiovascular: Negative for  left lateral reproducible chest pain.  Negative for palpitations.  Gastrointestinal: Negative for abdominal pain and vomiting.  Endocrine: Negative for polyphagia and polyuria.  Genitourinary: Negative for decreased urine volume, dysuria, enuresis, hematuria Musculoskeletal: Positive for myalgia.  Negative for back pain.  Skin: Negative for color change and rash.  Allergic/Immunologic: Negative for immunocompromised state.  Neurological: Negative for tremors, syncope, speech difficulty Hematological: Does not bruise/bleed easily.  All other systems reviewed and are negative   Past Medical History:  Diagnosis Date  . Anxiety   . COPD (chronic obstructive pulmonary disease) (HCC)   . DDD (degenerative disc disease), lumbar   . Diverticulitis   . GERD (gastroesophageal reflux disease)   . High cholesterol   . Hypertension    Past Surgical History:  Procedure Laterality Date  . ABDOMINAL HYSTERECTOMY      Social History:  reports that she has been smoking cigarettes. She has a 60.00 pack-year smoking history. She has never used smokeless tobacco. She reports current alcohol use. She reports that she does not use drugs.   Allergies  Allergen Reactions  . Codeine Itching and Other (See Comments)    REACTION: Severe itching, feels like skin is crawling    No family history on file.   Prior  to Admission medications   Medication Sig Start Date End Date Taking? Authorizing Provider  albuterol (PROVENTIL HFA;VENTOLIN HFA) 108 (90 BASE) MCG/ACT  inhaler Inhale 2 puffs into the lungs every 6 (six) hours as needed.   Yes [provider]  albuterol (PROVENTIL) (2.5 MG/3ML) 0.083% nebulizer solution Take 2.5 mg by nebulization every 6 (six) hours as needed.   Yes [provider]  Cholecalciferol (VITAMIN D3) 1.25 MG (50000 UT) CAPS Take 1 capsule by mouth daily.   Yes [provider]  levofloxacin (LEVAQUIN) 750 MG tablet Take 750 mg by mouth daily. 10/31/20  Yes [provider]  lisinopril-hydrochlorothiazide (PRINZIDE,ZESTORETIC) 20-12.5 MG per tablet Take 1 tablet by mouth daily.   Yes [provider]  lovastatin (MEVACOR) 20 MG tablet Take 20 mg by mouth every morning.   Yes [provider]  omeprazole (PRILOSEC) 20 MG capsule Take 20 mg by mouth daily.   Yes [provider]  oxyCODONE (OXY IR/ROXICODONE) 5 MG immediate release tablet Take 1 tablet (5 mg total) by mouth every 4 (four) hours as needed for moderate pain. 11/23/13  Yes Dorothea OgleMyers, Iskra M, MD  Oxycodone HCl 10 MG TABS Take 5 mg by mouth 4 (four) times daily.   Yes [provider]  PARoxetine (PAXIL) 20 MG tablet Take 20 mg by mouth daily.   Yes [provider]  predniSONE (DELTASONE) 20 MG tablet Take 20 mg by mouth See admin instructions. Take 2 tablets every day for 7 days, then take 1 tablet for 7 days 10/31/20  Yes [provider]  Probiotic Product (PROBIOTIC ADVANCED PO) Take 1 capsule by mouth daily.   Yes [provider]  simethicone (MYLICON) 80 MG chewable tablet Chew 80 mg by mouth every 6 (six) hours as needed for flatulence.   Yes [provider]  TRELEGY ELLIPTA 100-62.5-25 MCG/INH AEPB Inhale 1 puff into the lungs daily. 10/31/20  Yes [provider]  verapamil (CALAN) 120 MG tablet Take 120 mg by mouth 2 (two) times daily.   Yes [provider]  vitamin B-12 (CYANOCOBALAMIN) 1000 MCG tablet Take 1,000 mcg by mouth daily.   Yes [provider]  budesonide-formoterol (SYMBICORT) 80-4.5 MCG/ACT inhaler Inhale 2 puffs into the lungs 2 (two) times daily. Patient not taking: Reported on 11/01/2020    [provider]  buPROPion [facility](WELLBUTRIN SR) 150 MG 12 hr tablet  07/07/20   [provider]  ciprofloxacin (CIPRO) 500 MG tablet Take 1 tablet (500 mg total) by mouth 2 (two) times daily. Patient not taking: No sig reported 11/23/13   Dorothea OgleMyers, Iskra M, MD  lisinopril (ZESTRIL) 10 MG tablet Take by mouth. Patient not taking: Reported on 11/01/2020    [provider]  lovastatin (MEVACOR) 20 MG tablet Take 20 mg by mouth at bedtime. Patient not taking: Reported on 11/01/2020    [provider]  metroNIDAZOLE (FLAGYL) 500 MG tablet Take 1 tablet (500 mg total) by mouth every 8 (eight) hours. Patient not taking: No sig reported 11/23/13   Dorothea OgleMyers, Iskra M, MD  ondansetron (ZOFRAN) 4 MG tablet Take 1 tablet (4 mg total) by mouth every 6 (six) hours as needed for nausea. Patient not taking: No sig reported 11/23/13   Dorothea OgleMyers, Iskra M, MD  oxyCODONE-acetaminophen (PERCOCET/ROXICET) 5-325 MG per tablet Take 1 tablet by mouth every 6 (six) hours as needed. Patient not taking: Reported on 11/01/2020 02/20/15   Bethann BerkshireZammit, Joseph, MD  tetrahydrozoline 0.05 % ophthalmic solution Place 1 drop  into both eyes as needed (Dry Eyes).    [provider]    Physical Exam: BP 113/70   Pulse 100   Temp 97.8 F (36.6 C) (Oral)   Resp (!) 26   SpO2 92%   . General: 60 y.o. year-old female well developed well nourished in no acute distress.  Alert and oriented x3. Marland Kitchen HEENT: Mucous membrane.  NCAT, EOMI . Neck: Supple, trachea media . Cardiovascular: Tachycardia.  Tender to palpation of left lateral chest wall.  Regular rate and rhythm with no rubs or gallops.  No thyromegaly or JVD noted.  2/4 pulses in all 4 extremities. Marland Kitchen Respiratory: Diffuse expiratory wheezing.  No rales. . Abdomen: Soft nontender nondistended with normal bowel  sounds x4 quadrants. . Muskuloskeletal: No cyanosis, clubbing or edema noted bilaterally . Neuro: CN II-XII intact, strength, sensation, reflexes . Skin: No ulcerative lesions noted or rashes . Psychiatry: Judgement and insight appear normal. Mood is appropriate for condition and setting          Labs on Admission:  Basic Metabolic Panel: Recent Labs  Lab 11/01/20 1415  NA 129*  K 3.5  CL 89*  CO2 23  GLUCOSE 156*  BUN 25*  CREATININE 1.75*  CALCIUM 10.4*   Liver Function Tests: Recent Labs  Lab 11/01/20 1415  AST 31  ALT 19  ALKPHOS 52  BILITOT 0.4  PROT 7.7  ALBUMIN 4.4   No results for input(s): LIPASE, AMYLASE in the last 168 hours. No results for input(s): AMMONIA in the last 168 hours. CBC: Recent Labs  Lab 11/01/20 1415  WBC 15.3*  NEUTROABS 14.2*  HGB 14.4  HCT 41.0  MCV 102.8*  PLT 286   Cardiac Enzymes: No results for input(s): CKTOTAL, CKMB, CKMBINDEX, TROPONINI in the last 168 hours.  BNP (last 3 results) No results for input(s): BNP in the last 8760 hours.  ProBNP (last 3 results) No results for input(s): PROBNP in the last 8760 hours.  CBG: No results for input(s): GLUCAP in the last 168 hours.  Radiological Exams on Admission: US Venous Img Lower  Left (DVT Study)  Result Date: 11/01/2020 CLINICAL DATA:  Left calf pain EXAM: LEFT LOWER EXTREMITY VENOUS DOPPLER ULTRASOUND TECHNIQUE: Gray-scale sonography with graded compression, as well as color Doppler and duplex ultrasound were performed to evaluate the lower extremity deep venous systems from the level of the common femoral vein and including the common femoral, femoral, profunda femoral, popliteal and calf veins including the posterior tibial, peroneal and gastrocnemius veins when visible. The superficial great saphenous vein was also interrogated. Spectral Doppler was utilized to evaluate flow at rest and with distal augmentation maneuvers in the common femoral, femoral and popliteal  veins. COMPARISON:  None. FINDINGS: Contralateral Common Femoral Vein: Respiratory phasicity is normal and symmetric with the symptomatic side. No evidence of thrombus. Normal compressibility. Common Femoral Vein: No evidence of thrombus. Normal compressibility, respiratory phasicity and response to augmentation. Saphenofemoral Junction: No evidence of thrombus. Normal compressibility and flow on color Doppler imaging. Profunda Femoral Vein: No evidence of thrombus. Normal compressibility and flow on color Doppler imaging. Femoral Vein: No evidence of thrombus. Normal compressibility, respiratory phasicity and response to augmentation. Popliteal Vein: No evidence of thrombus. Normal compressibility, respiratory phasicity and response to augmentation. Calf Veins: No evidence of thrombus. Normal compressibility and flow on color Doppler imaging. Superficial Great Saphenous Vein: No evidence of thrombus. Normal compressibility. Venous Reflux:  None. Other Findings:  None. IMPRESSION: No evidence of left lower extremity  5 deep venous thrombosis. Electronically Signed   By: Duanne Guess D.O.   On: 11/01/2020 15:04   DG Chest Port 1 View  Result Date: 11/01/2020 CLINICAL DATA:  Short of breath EXAM: PORTABLE CHEST 1 VIEW COMPARISON:  11/21/2013 FINDINGS: Heart size and vascularity normal. Atherosclerotic calcification aortic arch. Prominent lung markings suggesting COPD. No acute infiltrate or effusion. Sclerotic irregularity left humeral head  due to healing fracture. Question free air under the right hemidiaphragm. IMPRESSION: No acute cardiopulmonary abnormality Healing fracture left humerus Possible intraperitoneal air. Recommend two view abdomen. Left lateral decubitus view also may be helpful. Electronically Signed   By: Marlan Palau M.D.   On: 11/01/2020 14:47   DG Abd 2 Views  Result Date: 11/01/2020 CLINICAL DATA:  Shortness of breath. EXAM: ABDOMEN - 2 VIEW COMPARISON:  None. FINDINGS: The bowel  gas pattern is normal with air-filled loops of large and small bowel seen throughout the abdomen. These extend to involve the regions below the right and left hemidiaphragm. A large amount of stool is seen within the ascending colon. No radio-opaque calculi or other significant radiographic abnormality is seen. Small phleboliths are seen within the pelvis. IMPRESSION: 1.   Large stool burden without evidence of bowel obstruction. 2. Further evaluation of the abdomen pelvis CT is recommended if intra-abdominal free air remains of clinical concern. Electronically Signed   By: Aram Candela M.D.   On: 11/01/2020 15:55    EKG: I independently viewed the EKG done and my findings are as followed: Sinus tachycardia at a rate of 104 bpm   Assessment/Plan Present on Admission: . COPD with acute exacerbation (HCC)  Principal Problem:   COPD with acute exacerbation (HCC) Active Problems:   Acute respiratory failure with hypoxia (HCC)   Dehydration   Leukocytosis   Hyperglycemia   Elevated MCV   Hypercalcemia   Hyponatremia   Pleuritic chest pain   Essential hypertension   Hyperlipidemia   GERD (gastroesophageal reflux disease)   Tobacco abuse   Acute respiratory failure with hypoxia secondary to acute exacerbation of COPD Treatment and Solu-Medrol was provided in the ED Continue duo nebs, Mucinex, Solu-Medrol, azithromycin. Continue home Ventolin and Trelegy Ellipta Continue Protonix to prevent steroid-induced ulcer Continue incentive spirometry and flutter valve Continue supplemental oxygen to maintain O2 sat > 92% with plan to wean patient off oxygen as tolerated (patient does not use supplemental oxygen at baseline)  Left-sided pleuritic chest pain in the setting of above This is possibly due to patient's recurrent cough episodes Lidocaine patch will be placed Continue Tylenol as needed Continue incentive spirometry and flutter valve  Leukocytosis/hyperglycemia possibly secondary  to reactive process vs steroid effect  WBC 15.3, no obvious sign of acute infectious process This may be due to hemoconcentration and/or steroid effect Continue to monitor WBC and blood glucose level with morning labs  Dehydration Continue IV hydration  Mild hypercalcemia Ca 10.4, continue IV hydration  Acute kidney injury BUN to creatinine 25/1.75 (most recent lab for comparison was 6 years ago with creatinine of 0.74) No recent lab for comparison Renally adjust medications, avoid nephrotoxic agents/dehydration/hypotension  Elevated MCV B12 and folate levels will be checked  Questionable intra-peritoneal air Chest x-ray was suggestive of possible intraperitoneal air Patient denies any abdominal pain or discomfort, abdominal exam was normal Abdominal x-ray done only showed large stool burden without evidence of bowel obstruction; further evaluation of the abdomen pelvis CT is recommended if intra-abdominal free air remains of clinical concern.  Essential hypertension  Hold home Prinzide due to soft BP and dehydration at this time  Hyperlipidemia Continue Pravachol  GERD Continue Protonix  Depression Continue Paxil  Tobacco abuse Patient was counseled on tobacco abuse cessation   DVT prophylaxis: Heparin subcu  Code Status: Full code  Family Communication: None at bedside  Disposition Plan:  Patient is from:                        home Anticipated DC to:                   SNF or family members home Anticipated DC date:               1 day Anticipated DC barriers:           Patient requires inpatient management due to hypoxia in the setting of COPD sedation  Consults called: None  Admission status: Observation    Frankey Shown MD [facility]  11/01/2020, 8:56 PM

## 2020-11-01 NOTE — Plan of Care (Signed)
  Problem: Education: Goal: Knowledge of General Education information will improve Description: Including pain rating scale, medication(s)/side effects and non-pharmacologic comfort measures Outcome: Progressing   Problem: Activity: Goal: Risk for activity intolerance will decrease Outcome: Progressing   Problem: Nutrition: Goal: Adequate nutrition will be maintained Outcome: Progressing   Problem: Safety: Goal: Ability to remain free from injury will improve Outcome: Progressing   Problem: Education: Goal: Knowledge of disease or condition will improve Outcome: Progressing   Problem: Activity: Goal: Ability to tolerate increased activity will improve Outcome: Progressing

## 2020-11-02 DIAGNOSIS — J441 Chronic obstructive pulmonary disease with (acute) exacerbation: Secondary | ICD-10-CM | POA: Diagnosis not present

## 2020-11-02 DIAGNOSIS — R718 Other abnormality of red blood cells: Secondary | ICD-10-CM | POA: Diagnosis not present

## 2020-11-02 DIAGNOSIS — K219 Gastro-esophageal reflux disease without esophagitis: Secondary | ICD-10-CM

## 2020-11-02 DIAGNOSIS — E860 Dehydration: Secondary | ICD-10-CM | POA: Diagnosis not present

## 2020-11-02 DIAGNOSIS — J9601 Acute respiratory failure with hypoxia: Secondary | ICD-10-CM | POA: Diagnosis not present

## 2020-11-02 DIAGNOSIS — I10 Essential (primary) hypertension: Secondary | ICD-10-CM

## 2020-11-02 LAB — CBC
HCT: 39.3 % (ref 36.0–46.0)
Hemoglobin: 13.3 g/dL (ref 12.0–15.0)
MCH: 35.2 pg — ABNORMAL HIGH (ref 26.0–34.0)
MCHC: 33.8 g/dL (ref 30.0–36.0)
MCV: 104 fL — ABNORMAL HIGH (ref 80.0–100.0)
Platelets: 226 10*3/uL (ref 150–400)
RBC: 3.78 MIL/uL — ABNORMAL LOW (ref 3.87–5.11)
RDW: 14.4 % (ref 11.5–15.5)
WBC: 11.7 10*3/uL — ABNORMAL HIGH (ref 4.0–10.5)
nRBC: 0 % (ref 0.0–0.2)

## 2020-11-02 LAB — VITAMIN B12: Vitamin B-12: 826 pg/mL (ref 180–914)

## 2020-11-02 LAB — COMPREHENSIVE METABOLIC PANEL
ALT: 18 U/L (ref 0–44)
AST: 28 U/L (ref 15–41)
Albumin: 3.8 g/dL (ref 3.5–5.0)
Alkaline Phosphatase: 43 U/L (ref 38–126)
Anion gap: 12 (ref 5–15)
BUN: 16 mg/dL (ref 8–23)
CO2: 26 mmol/L (ref 22–32)
Calcium: 9.5 mg/dL (ref 8.9–10.3)
Chloride: 94 mmol/L — ABNORMAL LOW (ref 98–111)
Creatinine, Ser: 0.9 mg/dL (ref 0.44–1.00)
GFR, Estimated: >60 mL/min (ref >60)
Glucose, Bld: 184 mg/dL — ABNORMAL HIGH (ref 70–99)
Potassium: 3.2 mmol/L — ABNORMAL LOW (ref 3.5–5.1)
Sodium: 132 mmol/L — ABNORMAL LOW (ref 135–145)
Total Bilirubin: 0.5 mg/dL (ref 0.3–1.2)
Total Protein: 6.8 g/dL (ref 6.5–8.1)

## 2020-11-02 LAB — HIV ANTIBODY (ROUTINE TESTING W REFLEX): HIV Screen 4th Generation wRfx: NONREACTIVE

## 2020-11-02 LAB — PROTIME-INR
INR: 0.9 (ref 0.8–1.2)
Prothrombin Time: 11.9 seconds (ref 11.4–15.2)

## 2020-11-02 LAB — APTT: aPTT: 27 seconds (ref 24–36)

## 2020-11-02 LAB — FOLATE: Folate: 6.4 ng/mL (ref >5.9)

## 2020-11-02 LAB — MAGNESIUM: Magnesium: 2.1 mg/dL (ref 1.7–2.4)

## 2020-11-02 LAB — PHOSPHORUS: Phosphorus: 2.9 mg/dL (ref 2.5–4.6)

## 2020-11-02 MED ORDER — FLUTICASONE-UMECLIDIN-VILANT 100-62.5-25 MCG/INH IN AEPB: 1.0000 | INHALATION_SPRAY | Freq: Every day | RESPIRATORY_TRACT | Status: DC

## 2020-11-02 MED ORDER — ALBUTEROL SULFATE HFA 108 (90 BASE) MCG/ACT IN AERS: 2.0000 | INHALATION_SPRAY | Freq: Four times a day (QID) | RESPIRATORY_TRACT | Status: DC | PRN

## 2020-11-02 MED ORDER — NAPHAZOLINE-GLYCERIN 0.012-0.2 % OP SOLN: 1.0000 [drp] | Freq: Four times a day (QID) | OPHTHALMIC | Status: DC | PRN

## 2020-11-02 MED ORDER — POTASSIUM CHLORIDE CRYS ER 20 MEQ PO TBCR
40.0000 meq | EXTENDED_RELEASE_TABLET | Freq: Once | ORAL | Status: AC
  Administered 2020-11-02: 40 meq via ORAL
  Filled 2020-11-02: qty 2

## 2020-11-02 MED ORDER — VITAMIN B-12 1000 MCG PO TABS
1000.0000 ug | ORAL_TABLET | Freq: Every day | ORAL | Status: DC
  Administered 2020-11-02: 1000 ug via ORAL
  Filled 2020-11-02: qty 1

## 2020-11-02 MED ORDER — PANTOPRAZOLE SODIUM 40 MG PO TBEC
40.0000 mg | DELAYED_RELEASE_TABLET | Freq: Every day | ORAL | Status: DC
  Administered 2020-11-02: 40 mg via ORAL
  Filled 2020-11-02: qty 1

## 2020-11-02 MED ORDER — HEPARIN SODIUM (PORCINE) 5000 UNIT/ML IJ SOLN
5000.0000 [IU] | Freq: Three times a day (TID) | INTRAMUSCULAR | Status: DC
  Administered 2020-11-02: 5000 [IU] via SUBCUTANEOUS
  Filled 2020-11-02: qty 1

## 2020-11-02 MED ORDER — MOMETASONE FURO-FORMOTEROL FUM 100-5 MCG/ACT IN AERO
2.0000 | INHALATION_SPRAY | Freq: Two times a day (BID) | RESPIRATORY_TRACT | Status: DC
  Administered 2020-11-02: 2 via RESPIRATORY_TRACT
  Filled 2020-11-02: qty 8.8

## 2020-11-02 MED ORDER — PAROXETINE HCL 20 MG PO TABS
20.0000 mg | ORAL_TABLET | Freq: Every day | ORAL | Status: DC
  Administered 2020-11-02: 20 mg via ORAL
  Filled 2020-11-02: qty 1

## 2020-11-02 MED ORDER — PRAVASTATIN SODIUM 10 MG PO TABS: 20.0000 mg | ORAL_TABLET | Freq: Every day | ORAL | Status: DC

## 2020-11-02 MED ORDER — METHYLPREDNISOLONE SODIUM SUCC 125 MG IJ SOLR
60.0000 mg | Freq: Two times a day (BID) | INTRAMUSCULAR | Status: DC
  Administered 2020-11-02: 60 mg via INTRAVENOUS
  Filled 2020-11-02: qty 2

## 2020-11-02 NOTE — Progress Notes (Signed)
Nsg Discharge Note  Admit Date:  11/01/2020 Discharge date: 11/02/2020   Caro Laroche to be D/C'd Home per MD order.  AVS completed.  Copy for chart, and copy for patient signed, and dated. Patient/caregiver able to verbalize understanding.  Discharge Medication: Allergies as of 11/02/2020      Reactions   Codeine Itching, Other (See Comments)   REACTION: Severe itching, feels like skin is crawling      Medication List    STOP taking these medications   budesonide-formoterol 80-4.5 MCG/ACT inhaler Commonly known as: SYMBICORT   ciprofloxacin 500 MG tablet Commonly known as: CIPRO   lisinopril 10 MG tablet Commonly known as: ZESTRIL   metroNIDAZOLE 500 MG tablet Commonly known as: FLAGYL   ondansetron 4 MG tablet Commonly known as: ZOFRAN   oxyCODONE-acetaminophen 5-325 MG tablet Commonly known as: PERCOCET/ROXICET     TAKE these medications   albuterol (2.5 MG/3ML) 0.083% nebulizer solution Commonly known as: PROVENTIL Take 2.5 mg by nebulization every 6 (six) hours as needed.   albuterol 108 (90 Base) MCG/ACT inhaler Commonly known as: VENTOLIN HFA Inhale 2 puffs into the lungs every 6 (six) hours as needed.   buPROPion 150 MG 12 hr tablet Commonly known as: WELLBUTRIN SR   levofloxacin 750 MG tablet Commonly known as: LEVAQUIN Take 750 mg by mouth daily.   lisinopril-hydrochlorothiazide 20-12.5 MG tablet Commonly known as: ZESTORETIC Take 1 tablet by mouth daily.   lovastatin 20 MG tablet Commonly known as: MEVACOR Take 20 mg by mouth every morning. What changed: Another medication with the same name was removed. Continue taking this medication, and follow the directions you see here.   omeprazole 20 MG capsule Commonly known as: PRILOSEC Take 20 mg by mouth daily.   Oxycodone HCl 10 MG Tabs Take 5 mg by mouth 4 (four) times daily.   oxyCODONE 5 MG immediate release tablet Commonly known as: Oxy IR/ROXICODONE Take 1 tablet (5 mg total) by mouth  every 4 (four) hours as needed for moderate pain.   PARoxetine 20 MG tablet Commonly known as: PAXIL Take 20 mg by mouth daily.   predniSONE 20 MG tablet Commonly known as: DELTASONE Take 20 mg by mouth See admin instructions. Take 2 tablets every day for 7 days, then take 1 tablet for 7 days   PROBIOTIC ADVANCED PO Take 1 capsule by mouth daily.   simethicone 80 MG chewable tablet Commonly known as: MYLICON Chew 80 mg by mouth every 6 (six) hours as needed for flatulence.   tetrahydrozoline 0.05 % ophthalmic solution Place 1 drop into both eyes as needed (Dry Eyes).   Trelegy Ellipta 100-62.5-25 MCG/INH Aepb Generic drug: Fluticasone-Umeclidin-Vilant Inhale 1 puff into the lungs daily.   verapamil 120 MG tablet Commonly known as: CALAN Take 120 mg by mouth 2 (two) times daily.   vitamin B-12 1000 MCG tablet Commonly known as: CYANOCOBALAMIN Take 1,000 mcg by mouth daily.   Vitamin D3 1.25 MG (50000 UT) Caps Take 1 capsule by mouth daily.       Discharge Assessment: Vitals:   11/02/20 0738 11/02/20 0956  BP:  131/64  Pulse:  90  Resp:  18  Temp:  98.3 F (36.8 C)  SpO2: 96% 95%   Skin clean, dry and intact without evidence of skin break down, no evidence of skin tears noted. IV catheter discontinued intact. Site without signs and symptoms of complications - no redness or edema noted at insertion site, patient denies c/o pain - only slight  tenderness at site.  Dressing with slight pressure applied.  D/c Instructions-Education: Discharge instructions given to patient/family with verbalized understanding. D/c education completed with patient/family including follow up instructions, medication list, d/c activities limitations if indicated, with other d/c instructions as indicated by MD - patient able to verbalize understanding, all questions fully answered. Patient instructed to return to ED, call 911, or call MD for any changes in condition.  Patient escorted via  WC, and D/C home via private auto.  Brandy Hale, LPN 5/63/8937 3:42 PM

## 2020-11-02 NOTE — Discharge Summary (Signed)
Physician Discharge Summary  Kathleen Larochellen D Finkler ZOX:[id number] DOB: 01/01/1961 DOA: 11/01/2020  PCP: Oval Linseyondiego, Richard, MD  Admit date: 11/01/2020 Discharge date: 11/02/2020  Admitted From:  Home  Disposition: Home   Recommendations for Outpatient Follow-up:  1. Follow up with PCP in 1 weeks  Discharge Condition: STABLE   CODE STATUS: FULL  DIET: heart healthy   Brief Hospitalization Summary: Please see all hospital notes, images, labs for full details of the hospitalization. ADMISSION HPI: Kathleen Bennett is a 60 y.o. female with medical history significant for COPD, hypertension, hypercholesterolemia, tobacco abuse & GERDwho presents to the ED with complaint of 1 week onset of shortness of breath, wheezing, cough with production of yellow/green phlegm.  Symptoms worsen at night with coughing spells.  She complained of 1 month onset of intermittent left lateral pleuritic pain which has worsened since onset of above symptoms.  Home rescue inhaler only mildly alleviate symptoms, she followed up with her PCP yesterday and was prescribed with prednisone (40 mg daily) and Levaquin, but patient complained of worsening symptoms, she called her PCP who asked her to go to the ED for further evaluation and management.  She continues to smoke cigarettes with the last tobacco use being around 11:30 AM today.  She complained of left leg soreness, but denies fever, chills, headache, numbness or tingling.  ED Course:  In the emergency department, she was intermittently tachypneic and tachycardic.  O2 sat on arrival to the ED was 82% on room air, so she was placed on supplemental oxygen via [place] at 3LPM with improved O2 sats to 92-100%.  Work-up in the ED showed leukocytosis, elevated MCV at 102.8, hyponatremia, hyperglycemia, BUN to creatinine 25/1.75 (most recent lab for comparison was 6 years ago with creatinine of 0.74), troponin was flat at 6.  Calcium 10.4.  SARS coronavirus 2 was negative. Chest x-ray showed  no acute cardiopulmonary abnormality and showed possible intraperitoneal air. Abdominal x-ray showed large stool burden without evidence of bowel obstruction Left lower extremity venous Doppler ultrasound showed no evidence of DVT Breathing treatment was provided, IV Solu-Medrol was given, patient was provided with IV hydration.  Hospitalist was asked to admit patient for further evaluation and management.  Hospital Course Pt was admitted for mild acute COPD exacerbation that had failed outpatient management.  She was placed on IV steroids and scheduled neb treatments and with good results she is feeling a lot better and her SOB is resolved now. She can now resume her outpatient steroid taper and antibiotics that were given to her by her PCP.  Follow up with PCP in 1 week for recheck.  At this time she feels well and moving good air and stable to discharge home.    Discharge Diagnoses:  Principal Problem:   COPD with acute exacerbation (HCC) Active Problems:   Acute respiratory failure with hypoxia (HCC)   Dehydration   Leukocytosis   Hyperglycemia   Elevated MCV   Hypercalcemia   Hyponatremia   Pleuritic chest pain   Essential hypertension   Hyperlipidemia   GERD (gastroesophageal reflux disease)   Tobacco abuse   AKI (acute kidney injury) [facility](HCC)  Discharge Instructions:  Allergies as of 11/02/2020      Reactions   Codeine Itching, Other (See Comments)   REACTION: Severe itching, feels like skin is crawling      Medication List    STOP taking these medications   budesonide-formoterol 80-4.5 MCG/ACT inhaler Commonly known as: SYMBICORT   ciprofloxacin 500 MG tablet Commonly  known as: CIPRO   lisinopril 10 MG tablet Commonly known as: ZESTRIL   metroNIDAZOLE 500 MG tablet Commonly known as: FLAGYL   ondansetron 4 MG tablet Commonly known as: ZOFRAN   oxyCODONE-acetaminophen 5-325 MG tablet Commonly known as: PERCOCET/ROXICET     TAKE these medications    albuterol (2.5 MG/3ML) 0.083% nebulizer solution Commonly known as: PROVENTIL Take 2.5 mg by nebulization every 6 (six) hours as needed.   albuterol 108 (90 Base) MCG/ACT inhaler Commonly known as: VENTOLIN HFA Inhale 2 puffs into the lungs every 6 (six) hours as needed.   buPROPion 150 MG 12 hr tablet Commonly known as: WELLBUTRIN SR   levofloxacin 750 MG tablet Commonly known as: LEVAQUIN Take 750 mg by mouth daily.   lisinopril-hydrochlorothiazide 20-12.5 MG tablet Commonly known as: ZESTORETIC Take 1 tablet by mouth daily.   lovastatin 20 MG tablet Commonly known as: MEVACOR Take 20 mg by mouth every morning. What changed: Another medication with the same name was removed. Continue taking this medication, and follow the directions you see here.   omeprazole 20 MG capsule Commonly known as: PRILOSEC Take 20 mg by mouth daily.   Oxycodone HCl 10 MG Tabs Take 5 mg by mouth 4 (four) times daily.   oxyCODONE 5 MG immediate release tablet Commonly known as: Oxy IR/ROXICODONE Take 1 tablet (5 mg total) by mouth every 4 (four) hours as needed for moderate pain.   PARoxetine 20 MG tablet Commonly known as: PAXIL Take 20 mg by mouth daily.   predniSONE 20 MG tablet Commonly known as: DELTASONE Take 20 mg by mouth See admin instructions. Take 2 tablets every day for 7 days, then take 1 tablet for 7 days   PROBIOTIC ADVANCED PO Take 1 capsule by mouth daily.   simethicone 80 MG chewable tablet Commonly known as: MYLICON Chew 80 mg by mouth every 6 (six) hours as needed for flatulence.   tetrahydrozoline 0.05 % ophthalmic solution Place 1 drop into both eyes as needed (Dry Eyes).   Trelegy Ellipta 100-62.5-25 MCG/INH Aepb Generic drug: Fluticasone-Umeclidin-Vilant Inhale 1 puff into the lungs daily.   verapamil 120 MG tablet Commonly known as: CALAN Take 120 mg by mouth 2 (two) times daily.   vitamin B-12 1000 MCG tablet Commonly known as: CYANOCOBALAMIN Take  1,000 mcg by mouth daily.   Vitamin D3 1.25 MG (50000 UT) Caps Take 1 capsule by mouth daily.       Follow-up Information    Oval Linsey, MD. Schedule an appointment as soon as possible for a visit in 1 week(s).   Specialty: Internal Medicine Contact information: [address] [place] [address] [phone]              Allergies  Allergen Reactions  . Codeine Itching and Other (See Comments)    REACTION: Severe itching, feels like skin is crawling   Allergies as of 11/02/2020      Reactions   Codeine Itching, Other (See Comments)   REACTION: Severe itching, feels like skin is crawling      Medication List    STOP taking these medications   budesonide-formoterol 80-4.5 MCG/ACT inhaler Commonly known as: SYMBICORT   ciprofloxacin 500 MG tablet Commonly known as: CIPRO   lisinopril 10 MG tablet Commonly known as: ZESTRIL   metroNIDAZOLE 500 MG tablet Commonly known as: FLAGYL   ondansetron 4 MG tablet Commonly known as: ZOFRAN   oxyCODONE-acetaminophen 5-325 MG tablet Commonly known as: PERCOCET/ROXICET     TAKE  these medications   albuterol (2.5 MG/3ML) 0.083% nebulizer solution Commonly known as: PROVENTIL Take 2.5 mg by nebulization every 6 (six) hours as needed.   albuterol 108 (90 Base) MCG/ACT inhaler Commonly known as: VENTOLIN HFA Inhale 2 puffs into the lungs every 6 (six) hours as needed.   buPROPion 150 MG 12 hr tablet Commonly known as: WELLBUTRIN SR   levofloxacin 750 MG tablet Commonly known as: LEVAQUIN Take 750 mg by mouth daily.   lisinopril-hydrochlorothiazide 20-12.5 MG tablet Commonly known as: ZESTORETIC Take 1 tablet by mouth daily.   lovastatin 20 MG tablet Commonly known as: MEVACOR Take 20 mg by mouth every morning. What changed: Another medication with the same name was removed. Continue taking this medication, and follow the directions you see here.   omeprazole 20 MG capsule Commonly known as:  PRILOSEC Take 20 mg by mouth daily.   Oxycodone HCl 10 MG Tabs Take 5 mg by mouth 4 (four) times daily.   oxyCODONE 5 MG immediate release tablet Commonly known as: Oxy IR/ROXICODONE Take 1 tablet (5 mg total) by mouth every 4 (four) hours as needed for moderate pain.   PARoxetine 20 MG tablet Commonly known as: PAXIL Take 20 mg by mouth daily.   predniSONE 20 MG tablet Commonly known as: DELTASONE Take 20 mg by mouth See admin instructions. Take 2 tablets every day for 7 days, then take 1 tablet for 7 days   PROBIOTIC ADVANCED PO Take 1 capsule by mouth daily.   simethicone 80 MG chewable tablet Commonly known as: MYLICON Chew 80 mg by mouth every 6 (six) hours as needed for flatulence.   tetrahydrozoline 0.05 % ophthalmic solution Place 1 drop into both eyes as needed (Dry Eyes).   Trelegy Ellipta 100-62.5-25 MCG/INH Aepb Generic drug: Fluticasone-Umeclidin-Vilant Inhale 1 puff into the lungs daily.   verapamil 120 MG tablet Commonly known as: CALAN Take 120 mg by mouth 2 (two) times daily.   vitamin B-12 1000 MCG tablet Commonly known as: CYANOCOBALAMIN Take 1,000 mcg by mouth daily.   Vitamin D3 1.25 MG (50000 UT) Caps Take 1 capsule by mouth daily.       Procedures/Studies: US Venous Img Lower  Left (DVT Study)  Result Date: 11/01/2020 CLINICAL DATA:  Left calf pain EXAM: LEFT LOWER EXTREMITY VENOUS DOPPLER ULTRASOUND TECHNIQUE: Gray-scale sonography with graded compression, as well as color Doppler and duplex ultrasound were performed to evaluate the lower extremity deep venous systems from the level of the common femoral vein and including the common femoral, femoral, profunda femoral, popliteal and calf veins including the posterior tibial, peroneal and gastrocnemius veins when visible. The superficial great saphenous vein was also interrogated. Spectral Doppler was utilized to evaluate flow at rest and with distal augmentation maneuvers in the common femoral,  femoral and popliteal veins. COMPARISON:  None. FINDINGS: Contralateral Common Femoral Vein: Respiratory phasicity is normal and symmetric with the symptomatic side. No evidence of thrombus. Normal compressibility. Common Femoral Vein: No evidence of thrombus. Normal compressibility, respiratory phasicity and response to augmentation. Saphenofemoral Junction: No evidence of thrombus. Normal compressibility and flow on color Doppler imaging. Profunda Femoral Vein: No evidence of thrombus. Normal compressibility and flow on color Doppler imaging. Femoral Vein: No evidence of thrombus. Normal compressibility, respiratory phasicity and response to augmentation. Popliteal Vein: No evidence of thrombus. Normal compressibility, respiratory phasicity and response to augmentation. Calf Veins: No evidence of thrombus. Normal compressibility and flow on color Doppler imaging. Superficial Great Saphenous Vein: No evidence of thrombus.  Normal compressibility. Venous Reflux:  None. Other Findings:  None. IMPRESSION: No evidence of left lower extremity 5 deep venous thrombosis. Electronically Signed   By: Duanne Guess D.O.   On: 11/01/2020 15:04   DG Chest Port 1 View  Result Date: 11/01/2020 CLINICAL DATA:  Short of breath EXAM: PORTABLE CHEST 1 VIEW COMPARISON:  11/21/2013 FINDINGS: Heart size and vascularity normal. Atherosclerotic calcification aortic arch. Prominent lung markings suggesting COPD. No acute infiltrate or effusion. Sclerotic irregularity left humeral head  due to healing fracture. Question free air under the right hemidiaphragm. IMPRESSION: No acute cardiopulmonary abnormality Healing fracture left humerus Possible intraperitoneal air. Recommend two view abdomen. Left lateral decubitus view also may be helpful. Electronically Signed   By: Marlan Palau M.D.   On: 11/01/2020 14:47   DG Abd 2 Views  Result Date: 11/01/2020 CLINICAL DATA:  Shortness of breath. EXAM: ABDOMEN - 2 VIEW COMPARISON:  None.  FINDINGS: The bowel gas pattern is normal with air-filled loops of large and small bowel seen throughout the abdomen. These extend to involve the regions below the right and left hemidiaphragm. A large amount of stool is seen within the ascending colon. No radio-opaque calculi or other significant radiographic abnormality is seen. Small phleboliths are seen within the pelvis. IMPRESSION: 1.   Large stool burden without evidence of bowel obstruction. 2. Further evaluation of the abdomen pelvis CT is recommended if intra-abdominal free air remains of clinical concern. Electronically Signed   By: Aram Candela M.D.   On: 11/01/2020 15:55      Subjective: Pt says she feels like her breathing is much better.    Discharge Exam: Vitals:   11/02/20 0738 11/02/20 0956  BP:  131/64  Pulse:  90  Resp:  18  Temp:  98.3 F (36.8 C)  SpO2: 96% 95%   Vitals:   11/02/20 0217 11/02/20 0433 11/02/20 0738 11/02/20 0956  BP: 130/78 124/69  131/64  Pulse: 93 97  90  Resp: (!) Temp: 98.2 F (36.8 C) 98.1 F (36.7 C)  98.3 F (36.8 C)  TempSrc: Oral Oral  Oral  SpO2: 96% 97% 96% 95%   General: Pt is alert, awake, not in acute distress Cardiovascular: normal S1/S2 +, no rubs, no gallops Respiratory: CTA bilaterally, good air movement, rare wheezing, no rhonchi Abdominal: Soft, NT, ND, bowel sounds + Extremities: no edema, no cyanosis   The results of significant diagnostics from this hospitalization (including imaging, microbiology, ancillary and laboratory) are listed below for reference.     Microbiology: Recent Results (from the past 240 hour(s))  Resp Panel by RT-PCR (Flu A&B, Covid) Nasopharyngeal Swab     Status: None   Collection Time: 11/01/20  2:32 PM   Specimen: Nasopharyngeal Swab; Nasopharyngeal(NP) swabs in vial transport medium  Result Value Ref Range Status   SARS Coronavirus 2 by RT PCR NEGATIVE NEGATIVE Final    Comment: (NOTE) SARS-CoV-2 target nucleic acids are  NOT DETECTED.  The SARS-CoV-2 RNA is generally detectable in upper respiratory specimens during the acute phase of infection. The lowest concentration of SARS-CoV-2 viral copies this assay can detect is 138 copies/mL. A negative result does not preclude SARS-Cov-2 infection and should not be used as the sole basis for treatment or other patient management decisions. A negative result may occur with  improper specimen collection/handling, submission of specimen other than nasopharyngeal swab, presence of viral mutation(s) within the areas targeted by this assay, and inadequate number of viral  copies(<138 copies/mL). A negative result must be combined with clinical observations, patient history, and epidemiological information. The expected result is Negative.  Fact Sheet for Patients:  BloggerCourse.com  Fact Sheet for Healthcare Providers:  SeriousBroker.it  This test is no t yet approved or cleared by the [place] FDA and  has been authorized for detection and/or diagnosis of SARS-CoV-2 by FDA under an Emergency Use Authorization (EUA). This EUA will remain  in effect (meaning this test can be used) for the duration of the COVID-19 declaration under Section 564(b)(1) of the Act, 21 U.S.C.section 360bbb-3(b)(1), unless the authorization is terminated  or revoked sooner.       Influenza A by PCR NEGATIVE NEGATIVE Final   Influenza B by PCR NEGATIVE NEGATIVE Final    Comment: (NOTE) The Xpert Xpress SARS-CoV-2/FLU/RSV plus assay is intended as an aid in the diagnosis of influenza from Nasopharyngeal swab specimens and should not be used as a sole basis for treatment. Nasal washings and aspirates are unacceptable for Xpert Xpress SARS-CoV-2/FLU/RSV testing.  Fact Sheet for Patients: BloggerCourse.com  Fact Sheet for Healthcare Providers: SeriousBroker.it  This test is not  yet approved or cleared by the [place] FDA and has been authorized for detection and/or diagnosis of SARS-CoV-2 by FDA under an Emergency Use Authorization (EUA). This EUA will remain in effect (meaning this test can be used) for the duration of the COVID-19 declaration under Section 564(b)(1) of the Act, 21 U.S.C. section 360bbb-3(b)(1), unless the authorization is terminated or revoked.  Performed at [facility], [address], [place], [place] [address]      Labs: BNP (last 3 results) No results for input(s): BNP in the last 8760 hours. Basic Metabolic Panel: Recent Labs  Lab 11/01/20 1415 11/02/20 0451  NA 129* 132*  K 3.5 3.2*  CL 89* 94*  CO2 23 26  GLUCOSE 156* 184*  BUN 25* 16  CREATININE 1.75* 0.90  CALCIUM 10.4* 9.5  MG  --  2.1  PHOS  --  2.9   Liver Function Tests: Recent Labs  Lab 11/01/20 1415 11/02/20 0451  AST 31 28  ALT 19 18  ALKPHOS 52 43  BILITOT 0.4 0.5  PROT 7.7 6.8  ALBUMIN 4.4 3.8   No results for input(s): LIPASE, AMYLASE in the last 168 hours. No results for input(s): AMMONIA in the last 168 hours. CBC: Recent Labs  Lab 11/01/20 1415 11/02/20 0451  WBC 15.3* 11.7*  NEUTROABS 14.2*  --   HGB 14.4 13.3  HCT 41.0 39.3  MCV 102.8* 104.0*  PLT 286 226   Cardiac Enzymes: No results for input(s): CKTOTAL, CKMB, CKMBINDEX, TROPONINI in the last 168 hours. BNP: Invalid input(s): POCBNP CBG: No results for input(s): GLUCAP in the last 168 hours. D-Dimer No results for input(s): DDIMER in the last 72 hours. Hgb A1c No results for input(s): HGBA1C in the last 72 hours. Lipid Profile No results for input(s): CHOL, HDL, LDLCALC, TRIG, CHOLHDL, LDLDIRECT in the last 72 hours. Thyroid function studies No results for input(s): TSH, T4TOTAL, T3FREE, THYROIDAB in the last 72 hours.  Invalid input(s): FREET3 Anemia work up Recent Labs    11/02/20 0451  VITAMINB12 826  FOLATE 6.4   Urinalysis    Component Value Date/Time    COLORURINE RED (A) 11/21/2013 1350   APPEARANCEUR CLOUDY (A) 11/21/2013 1350   LABSPEC 1.010 11/21/2013 1350   PHURINE 7.0 11/21/2013 1350   GLUCOSEU 100 (A) 11/21/2013 1350   HGBUR MODERATE (A) 11/21/2013 1350  BILIRUBINUR MODERATE (A) 11/21/2013 1350   KETONESUR TRACE (A) 11/21/2013 1350   PROTEINUR 100 (A) 11/21/2013 1350   UROBILINOGEN 4.0 (H) 11/21/2013 1350   NITRITE NEGATIVE 11/21/2013 1350   LEUKOCYTESUR NEGATIVE 11/21/2013 1350   Sepsis Labs Invalid input(s): PROCALCITONIN,  WBC,  LACTICIDVEN Microbiology Recent Results (from the past 240 hour(s))  Resp Panel by RT-PCR (Flu A&B, Covid) Nasopharyngeal Swab     Status: None   Collection Time: 11/01/20  2:32 PM   Specimen: Nasopharyngeal Swab; Nasopharyngeal(NP) swabs in vial transport medium  Result Value Ref Range Status   SARS Coronavirus 2 by RT PCR NEGATIVE NEGATIVE Final    Comment: (NOTE) SARS-CoV-2 target nucleic acids are NOT DETECTED.  The SARS-CoV-2 RNA is generally detectable in upper respiratory specimens during the acute phase of infection. The lowest concentration of SARS-CoV-2 viral copies this assay can detect is 138 copies/mL. A negative result does not preclude SARS-Cov-2 infection and should not be used as the sole basis for treatment or other patient management decisions. A negative result may occur with  improper specimen collection/handling, submission of specimen other than nasopharyngeal swab, presence of viral mutation(s) within the areas targeted by this assay, and inadequate number of viral copies(<138 copies/mL). A negative result must be combined with clinical observations, patient history, and epidemiological information. The expected result is Negative.  Fact Sheet for Patients:  BloggerCourse.com  Fact Sheet for Healthcare Providers:  SeriousBroker.it  This test is no t yet approved or cleared by the [place] FDA and  has been  authorized for detection and/or diagnosis of SARS-CoV-2 by FDA under an Emergency Use Authorization (EUA). This EUA will remain  in effect (meaning this test can be used) for the duration of the COVID-19 declaration under Section 564(b)(1) of the Act, 21 U.S.C.section 360bbb-3(b)(1), unless the authorization is terminated  or revoked sooner.       Influenza A by PCR NEGATIVE NEGATIVE Final   Influenza B by PCR NEGATIVE NEGATIVE Final    Comment: (NOTE) The Xpert Xpress SARS-CoV-2/FLU/RSV plus assay is intended as an aid in the diagnosis of influenza from Nasopharyngeal swab specimens and should not be used as a sole basis for treatment. Nasal washings and aspirates are unacceptable for Xpert Xpress SARS-CoV-2/FLU/RSV testing.  Fact Sheet for Patients: BloggerCourse.com  Fact Sheet for Healthcare Providers: SeriousBroker.it  This test is not yet approved or cleared by the [place] FDA and has been authorized for detection and/or diagnosis of SARS-CoV-2 by FDA under an Emergency Use Authorization (EUA). This EUA will remain in effect (meaning this test can be used) for the duration of the COVID-19 declaration under Section 564(b)(1) of the Act, 21 U.S.C. section 360bbb-3(b)(1), unless the authorization is terminated or revoked.  Performed at [facility], [address] [place], [place] [address]    Time coordinating discharge:   SIGNED:  Standley Dakins, MD  [facility] 11/02/2020, 10:08 AM How to contact the [facility] Attending or Consulting provider 7A - 7P or covering provider during after hours 7P -7A, for this patient?  1. Check the care team in [facility] and look for a) attending/consulting [facility] provider listed and b) the [facility] team listed 2. Log into [email or web address] and use [facility]'s universal password to access. If you do not have the password, please contact the hospital operator. 3. Locate the [facility] provider you are  looking for under [facility] and page to a number that you can be directly reached. 4. If you still have difficulty reaching the  provider, please page the [facility] (Director on Call) for the Hospitalists listed on amion for assistance.

## 2020-11-02 NOTE — Discharge Instructions (Signed)
Chronic Obstructive Pulmonary Disease Exacerbation Chronic obstructive pulmonary disease (COPD) is a long-term (chronic) lung problem. In COPD, the flow of air from the lungs is limited. COPD exacerbations are times that breathing gets worse and you need more than your normal treatment. Without treatment, they can be life threatening. If they happen often, your lungs can become more damaged. If your COPD gets worse, your doctor may treat you with:  Medicines.  Oxygen.  Different ways to clear your airway, such as using a mask. Follow these instructions at home: Medicines  Take over-the-counter and prescription medicines only as told by your doctor.  If you take an antibiotic or steroid medicine, do not stop taking the medicine even if you start to feel better.  Keep up with shots (vaccinations) as told by your doctor. Be sure to get a yearly (annual) flu shot. Lifestyle  Do not smoke. If you need help quitting, ask your doctor.  Eat healthy foods.  Exercise regularly.  Get plenty of sleep.  Avoid tobacco smoke and other things that can bother your lungs.  Wash your hands often with soap and water. This will help keep you from getting an infection. If you cannot use soap and water, use hand sanitizer.  During flu season, avoid areas that are crowded with people. General instructions  Drink enough fluid to keep your pee (urine) clear or pale yellow. Do not do this if your doctor has told you not to.  Use a cool mist machine (vaporizer).  If you use oxygen or a machine that turns medicine into a mist (nebulizer), continue to use it as told.  Follow all instructions for rehabilitation. These are steps you can take to make your body work better.  Keep all follow-up visits as told by your doctor. This is important. Contact a doctor if:  Your COPD symptoms get worse than normal. Get help right away if:  You are short of breath and it gets worse.  You have trouble  talking.  You have chest pain.  You cough up blood.  You have a fever.  You keep throwing up (vomiting).  You feel weak or you pass out (faint).  You feel confused.  You are not able to sleep because of your symptoms.  You are not able to do daily activities. Summary  COPD exacerbations are times that breathing gets worse and you need more treatment than normal.  COPD exacerbations can be very serious and may cause your lungs to become more damaged.  Do not smoke. If you need help quitting, ask your doctor.  Stay up-to-date on your shots. Get a flu shot every year. This information is not intended to replace advice given to you by your health care provider. Make sure you discuss any questions you have with your health care provider. Document Revised: 07/19/2017 Document Reviewed: 09/10/2016 Elsevier Patient Education  2021 [employer]   IMPORTANT INFORMATION: PAY CLOSE ATTENTION   PHYSICIAN DISCHARGE INSTRUCTIONS  Follow with Primary care provider  Oval Linsey, MD  and other consultants as instructed by your Hospitalist Physician  SEEK MEDICAL CARE OR RETURN TO EMERGENCY ROOM IF SYMPTOMS COME BACK, WORSEN OR NEW PROBLEM DEVELOPS   Please note: You were cared for by a hospitalist during your hospital stay. Every effort will be made to forward records to your primary care provider.  You can request that your primary care provider send for your hospital records if they have not received them.  Once you are discharged, your primary  care physician will handle any further medical issues. Please note that NO REFILLS for any discharge medications will be authorized once you are discharged, as it is imperative that you return to your primary care physician (or establish a relationship with a primary care physician if you do not have one) for your post hospital discharge needs so that they can reassess your need for medications and monitor your lab values.  Please get a  complete blood count and chemistry panel checked by your Primary MD at your next visit, and again as instructed by your Primary MD.  Get Medicines reviewed and adjusted: Please take all your medications with you for your next visit with your Primary MD  Laboratory/radiological data: Please request your Primary MD to go over all hospital tests and procedure/radiological results at the follow up, please ask your primary care provider to get all Hospital records sent to his/her office.  In some cases, they will be blood work, cultures and biopsy results pending at the time of your discharge. Please request that your primary care provider follow up on these results.  If you are diabetic, please bring your blood sugar readings with you to your follow up appointment with primary care.    Please call and make your follow up appointments as soon as possible.    Also Note the following: If you experience worsening of your admission symptoms, develop shortness of breath, life threatening emergency, suicidal or homicidal thoughts you must seek medical attention immediately by calling 911 or calling your MD immediately  if symptoms less severe.  You must read complete instructions/literature along with all the possible adverse reactions/side effects for all the Medicines you take and that have been prescribed to you. Take any new Medicines after you have completely understood and accpet all the possible adverse reactions/side effects.   Do not drive when taking Pain medications or sleeping medications (Benzodiazepines)  Do not take more than prescribed Pain, Sleep and Anxiety Medications. It is not advisable to combine anxiety,sleep and pain medications without talking with your primary care practitioner  Special Instructions: If you have smoked or chewed Tobacco  in the last 2 yrs please stop smoking, stop any regular Alcohol  and or any Recreational drug use.  Wear Seat belts while driving.  Do not  drive if taking any narcotic, mind altering or controlled substances or recreational drugs or alcohol.

## 2020-11-02 NOTE — Progress Notes (Signed)
This RN went in to provide patient with discharge instructions. Upon assessment, patient is on 2 L O2. Asked patient if she wears oxygen at home. Patient stated she did. Asked patient if she felt she needed oxygen at home to which patient stated yes. Explained to patient the process of doing an O2 study to see if she would qualify for oxygen. However, patient stated she did not want to do this and would prefer to discuss this with her primary doctor. On 2 L O2 patient saturated at 97% and on room air saturations at 90-91% at rest. Explained to patient risks of being discharged home without oxygen with the potential of her needing to be admitted back to the hospital if her O2 dropped too low. Patient verbalized understanding and stated she did not want to wait and that she already has an appointment scheduled with her primary doctor this Friday. Primary nurse Val, LPN present during conversation.   Dr. Laural Benes made aware.   Proceeded with discharge instructions. Patient verbalized understanding of discharge instructions and had no further questions. IV removed without complications.

## 2020-11-17 ENCOUNTER — Other Ambulatory Visit (HOSPITAL_COMMUNITY): Payer: Self-pay | Admitting: Family Medicine

## 2020-11-17 DIAGNOSIS — Z1231 Encounter for screening mammogram for malignant neoplasm of breast: Secondary | ICD-10-CM

## 2020-12-08 ENCOUNTER — Ambulatory Visit (HOSPITAL_COMMUNITY)
Admission: RE | Admit: 2020-12-08 | Discharge: 2020-12-08 | Disposition: A | Discharge status: Home / Self Care | Payer: Medicaid Other | Source: Ambulatory Visit | Attending: Family Medicine | Admitting: Family Medicine

## 2020-12-08 ENCOUNTER — Other Ambulatory Visit (HOSPITAL_COMMUNITY): Payer: Self-pay | Admitting: Family Medicine

## 2020-12-08 ENCOUNTER — Other Ambulatory Visit: Payer: Self-pay

## 2020-12-08 DIAGNOSIS — M25552 Pain in left hip: Secondary | ICD-10-CM

## 2020-12-26 ENCOUNTER — Ambulatory Visit (HOSPITAL_COMMUNITY)
Admission: RE | Admit: 2020-12-26 | Discharge: 2020-12-26 | Disposition: A | Discharge status: Home / Self Care | Payer: Medicaid Other | Source: Ambulatory Visit | Attending: Family Medicine | Admitting: Family Medicine

## 2020-12-26 DIAGNOSIS — Z1231 Encounter for screening mammogram for malignant neoplasm of breast: Secondary | ICD-10-CM | POA: Insufficient documentation

## 2021-01-20 ENCOUNTER — Encounter: Payer: Self-pay | Admitting: Orthopedic Surgery

## 2021-01-23 ENCOUNTER — Emergency Department (HOSPITAL_COMMUNITY)
Admission: EM | Admit: 2021-01-23 | Discharge: 2021-01-23 | Disposition: A | Discharge status: Home / Self Care | Payer: Medicaid Other | Attending: Emergency Medicine | Admitting: Emergency Medicine

## 2021-01-23 ENCOUNTER — Emergency Department (HOSPITAL_COMMUNITY): Payer: Medicaid Other

## 2021-01-23 ENCOUNTER — Other Ambulatory Visit: Payer: Self-pay

## 2021-01-23 DIAGNOSIS — R059 Cough, unspecified: Secondary | ICD-10-CM | POA: Insufficient documentation

## 2021-01-23 DIAGNOSIS — J449 Chronic obstructive pulmonary disease, unspecified: Secondary | ICD-10-CM | POA: Insufficient documentation

## 2021-01-23 DIAGNOSIS — S5011XA Contusion of right forearm, initial encounter: Secondary | ICD-10-CM | POA: Insufficient documentation

## 2021-01-23 DIAGNOSIS — Z5321 Procedure and treatment not carried out due to patient leaving prior to being seen by health care provider: Secondary | ICD-10-CM | POA: Insufficient documentation

## 2021-01-23 DIAGNOSIS — W19XXXA Unspecified fall, initial encounter: Secondary | ICD-10-CM | POA: Diagnosis not present

## 2021-01-23 LAB — BASIC METABOLIC PANEL
Anion gap: 14 (ref 5–15)
BUN: 11 mg/dL (ref 8–23)
CO2: 28 mmol/L (ref 22–32)
Calcium: 9.7 mg/dL (ref 8.9–10.3)
Chloride: 86 mmol/L — ABNORMAL LOW (ref 98–111)
Creatinine, Ser: 0.72 mg/dL (ref 0.44–1.00)
GFR, Estimated: >60 mL/min (ref >60)
Glucose, Bld: 91 mg/dL (ref 70–99)
Potassium: 4.8 mmol/L (ref 3.5–5.1)
Sodium: 128 mmol/L — ABNORMAL LOW (ref 135–145)

## 2021-01-23 LAB — CBC WITH DIFFERENTIAL/PLATELET
Abs Immature Granulocytes: 0.08 10*3/uL — ABNORMAL HIGH (ref 0.00–0.07)
Basophils Absolute: 0.1 10*3/uL (ref 0.0–0.1)
Basophils Relative: 0 %
Eosinophils Absolute: 0 10*3/uL (ref 0.0–0.5)
Eosinophils Relative: 0 %
HCT: 39.6 % (ref 36.0–46.0)
Hemoglobin: 13.3 g/dL (ref 12.0–15.0)
Immature Granulocytes: 1 %
Lymphocytes Relative: 10 %
Lymphs Abs: 1.2 10*3/uL (ref 0.7–4.0)
MCH: 35.9 pg — ABNORMAL HIGH (ref 26.0–34.0)
MCHC: 33.6 g/dL (ref 30.0–36.0)
MCV: 107 fL — ABNORMAL HIGH (ref 80.0–100.0)
Monocytes Absolute: 1.2 10*3/uL — ABNORMAL HIGH (ref 0.1–1.0)
Monocytes Relative: 10 %
Neutro Abs: 9.2 10*3/uL — ABNORMAL HIGH (ref 1.7–7.7)
Neutrophils Relative %: 79 %
Platelets: 294 10*3/uL (ref 150–400)
RBC: 3.7 MIL/uL — ABNORMAL LOW (ref 3.87–5.11)
RDW: 14.6 % (ref 11.5–15.5)
WBC: 11.7 10*3/uL — ABNORMAL HIGH (ref 4.0–10.5)
nRBC: 0 % (ref 0.0–0.2)

## 2021-01-23 MED ORDER — ALBUTEROL SULFATE HFA 108 (90 BASE) MCG/ACT IN AERS: 2.0000 | INHALATION_SPRAY | RESPIRATORY_TRACT | Status: AC

## 2021-01-23 MED ORDER — METHYLPREDNISOLONE SODIUM SUCC 125 MG IJ SOLR: 125.0000 mg | Freq: Once | INTRAMUSCULAR | Status: DC

## 2021-01-23 NOTE — ED Provider Notes (Signed)
Emergency Medicine Provider Triage Evaluation Note  Kathleen Bennett , a 59 y.o. female  was evaluated in triage.  Pt complains of fall and low oxygen.  Review of Systems  Positive: Right wrist pain, cough Negative: Numbness or tingling  Physical Exam  BP 111/67 (BP Location: Left Arm)   Pulse 69   Temp 98.4 F (36.9 C) (Oral)   Resp 19   Ht 5\' 3"  (1.6 m)   Wt 61.7 kg   SpO2 (!) 88%   BMI 24.09 kg/m  Gen:   Awake, no distress   Resp:  Normal effort  MSK:   Moves extremities without difficulty  Other:  No respiratory distress, right wrist has edema and erythema  Medical Decision Making  Medically screening exam initiated at 2:38 PM.  Appropriate orders placed.  Kathleen Bennett was informed that the remainder of the evaluation will be completed by another provider, this initial triage assessment does not replace that evaluation, and the importance of remaining in the ED until their evaluation is complete.  Patient will need to stay in the ER for further assessment.  It appears that she has new oxygen requirement, she was noted to be hypoxic at arrival.  Normally not on home O2.  X-rays of the wrist, chest ordered at this time along with basic labs.   Caro Laroche, MD 01/23/21 [phone]

## 2021-01-23 NOTE — ED Triage Notes (Signed)
Pt reports has copd.  Reports her o2 dropped Saturday and she passed out.  C/O pain, swelling, and bruising to r forearm.  Pt says her o2 sat at home today was 76%.  Pt also reports productive cough since Friday with yellow sputum.

## 2021-01-31 ENCOUNTER — Ambulatory Visit (INDEPENDENT_AMBULATORY_CARE_PROVIDER_SITE_OTHER): Payer: Medicaid Other | Admitting: Orthopedic Surgery

## 2021-01-31 ENCOUNTER — Other Ambulatory Visit: Payer: Self-pay

## 2021-01-31 ENCOUNTER — Encounter: Payer: Self-pay | Admitting: Orthopedic Surgery

## 2021-01-31 VITALS — BP 93/59 | HR 83 | Ht 63.0 in | Wt 133.0 lb

## 2021-01-31 DIAGNOSIS — S52501A Unspecified fracture of the lower end of right radius, initial encounter for closed fracture: Secondary | ICD-10-CM | POA: Diagnosis not present

## 2021-01-31 NOTE — Progress Notes (Signed)
New Patient Visit  Assessment: Kathleen Bennett is a 60 y.o. female with the following: 1. Closed fracture of distal end of right radius, unspecified fracture morphology, initial encounter  Plan: Reviewed radiographs with patient today in clinic which demonstrates minimally displaced fracture of the right distal radius.  Upon presentation to the clinic today, she was wearing a removable wrist splint.  She is comfortable with this method of immobilization at this point.  However, the splint was short, and causing some compression at the wrist which was less than ideal.  Therefore, she was placed into a longer, more stable wrist splint.  Advised her to wear this at all times, with the exception of the hygiene for at least the next 2 to 3 weeks.  After that, she can gradually start using her right hand and wrist for more activities.  She is to limit lifting to nothing more than a coffee cup until the next visit.  All questions were answered, and she is amenable to this plan.   Follow-up: Return in about 4 weeks (around 02/28/2021).  Subjective:  Chief Complaint  Patient presents with   Fracture    Rt wrist fx DOI 01/21/21    History of Present Illness: Kathleen Bennett is a 60 y.o. female who presents for evaluation of right wrist pain.  Approximately 12 days ago, she states that she was having difficulty oxygenating, and subsequently passed out.  Then had pain in the right wrist.  Initially, she did not seek medical attention.  However, after couple of days of persistent and worsening pain, she presented to the emergency department for evaluation.  X-rays at that time noted a impacted, minimally displaced distal radius fracture.  She was evaluated by her primary care provider yesterday, and placed in the removable wrist splint.  He has some swelling around the wrist, and notes that the wrist splint causes compression.  The pain has been controlled, although she notes some throbbing, particularly at  nighttime.   Review of Systems: No fevers or chills No numbness or tingling No chest pain Occasional shortness of breath No bowel or bladder dysfunction No GI distress No headaches   Medical History:  Past Medical History:  Diagnosis Date   Anxiety    COPD (chronic obstructive pulmonary disease) (HCC)    DDD (degenerative disc disease), lumbar    Diverticulitis    GERD (gastroesophageal reflux disease)    High cholesterol    Hypertension     Past Surgical History:  Procedure Laterality Date   ABDOMINAL HYSTERECTOMY      No family history on file. Social History   Tobacco Use   Smoking status: Every Day    Packs/day: 1.50    Years: 40.00    Pack years: 60.00    Types: Cigarettes   Smokeless tobacco: Never  Substance Use Topics   Alcohol use: Yes    Comment: beer daily x 6   Drug use: No    Allergies  Allergen Reactions   Codeine Itching and Other (See Comments)    REACTION: Severe itching, feels like skin is crawling    No outpatient medications have been marked as taking for the 01/31/21 encounter (Office Visit) with Oliver Barre, MD.    Objective: BP (!) 93/59   Pulse 83   Ht 5\' 3"  (1.6 m)   Wt 133 lb (60.3 kg)   BMI 23.56 kg/m   Physical Exam:  General: Alert and oriented.  No acute distress Gait: Normal gait  Evaluation of the right wrist demonstrates some swelling of the distal radius, as well as some bruising in this area.  She is full range of motion of all fingers.  She also tolerates some pronation, supination flexion and extension of the right wrist.  2+ radial pulse.  Fingers are warm and well-perfused.  She has intact sensation in all nerve distributions to the right hand.    IMAGING: I personally reviewed images previously obtained from the ED  X-rays of the right wrist demonstrates a minimally displaced, impacted distal radius fracture.  Excellent alignment at the wrist joint.  New Medications:  No orders of the defined  types were placed in this encounter.     Oliver Barre, MD  01/31/2021 10:33 PM

## 2021-01-31 NOTE — Patient Instructions (Signed)
Wear brace, except for hygiene  Lift nothing more than a coffee cup

## 2021-02-28 ENCOUNTER — Ambulatory Visit: Payer: Medicaid Other

## 2021-02-28 ENCOUNTER — Other Ambulatory Visit: Payer: Self-pay

## 2021-02-28 ENCOUNTER — Encounter: Payer: Self-pay | Admitting: Orthopedic Surgery

## 2021-02-28 ENCOUNTER — Ambulatory Visit (INDEPENDENT_AMBULATORY_CARE_PROVIDER_SITE_OTHER): Payer: Medicaid Other | Admitting: Orthopedic Surgery

## 2021-02-28 DIAGNOSIS — S52501D Unspecified fracture of the lower end of right radius, subsequent encounter for closed fracture with routine healing: Secondary | ICD-10-CM

## 2021-02-28 NOTE — Progress Notes (Signed)
[facility] Return  Assessment: Kathleen Bennett is a 60 y.o. female with the following: Right distal radius fracture, minimally displaced; nonoperative management Remote history of left proximal humerus fracture, with limited motion  Plan: Repeat radiographs demonstrates maintenance of alignment.  She has excellent range of motion at this point, considering the injury happened 6 weeks ago.  Okay to wean out of the splint.  Provided her with some simple exercises for her to work on at home.  For now, she will follow-up as needed.  We also briefly discussed a remote history of a left proximal humerus fracture, that we may address in the future.  However, I did stress to her that I want her right wrist to be fully functional, including normal strength and range of motion.  Follow-up: Return if symptoms worsen or fail to improve.   Subjective:  Chief Complaint  Patient presents with   Fracture    Rt wrist fx DOI 01/21/21     History of Present Illness: Kathleen Bennett is a 60 y.o. female who returns to clinic for repeat evaluation of her right distal radius fracture.  Injury sustained approximately 6 weeks ago.  She has been treated nonoperatively, first in a splint, now on a removable wrist brace.  She does remove the wrist brace for hygiene, and some light activities in her home.  Pain is well controlled.  She is started working on range of motion.  No numbness or tingling.  She also states that she sustained a left proximal humerus fracture, that was initially treated by Dr. Romeo Apple without surgery, with definitive management completed elsewhere.  She did not undergo surgery, she has some health concerns.  However, at this time she has very limited range of motion.  Review of Systems: No fevers or chills No numbness or tingling No chest pain No shortness of breath No bowel or bladder dysfunction No GI distress No headaches   Objective: There were no vitals taken for this  visit.  Physical Exam:  Evaluation of the right wrist demonstrates mild swelling.  No ecchymosis is appreciated.  Slightly decreased extension, flexion, supination and pronation at the wrist.  No pain with passive and active range of motion.  Fingers are warm and well-perfused.  Sensation is intact in the M/U/R nerve distribution.  2+ radial pulse.  Brief exam of the left shoulder demonstrates active forward flexion to 90 degrees.  Abduction at her side to just short of 90 degrees.  Limited internal rotation.  IMAGING: I personally ordered and reviewed the following images:  X-rays of the right wrist were obtained in clinic today, compared to previous x-rays.  Distal radius fracture is again visualized.  Joint line remains intact.  There has been no interval displacement of the fracture site.  Interval consolidation also formation.  Fracture is essentially nondisplaced.  No angulation.  Impression: Healing right distal radius fracture  Oliver Barre, MD 02/28/2021 10:12 PM

## 2021-02-28 NOTE — Patient Instructions (Signed)
Wrist Fracture Rehab Ask your health care provider which exercises are safe for you. Do exercises exactly as told by your health care provider and adjust them as directed. It is normal to feel mild stretching, pulling, tightness, or discomfort as you do these exercises. Stop right away if you feel sudden pain or your pain gets worse. Do not begin these exercises until told by your health care provider. Stretching and range-of-motion exercises These exercises warm up your muscles and joints and improve the movement and flexibility of your wrist and hand. These exercises also help to relieve pain,numbness, and tingling. Finger flexion and extension Sit or stand with your elbow at your side. Open and stretch your left / right fingers as wide as you can (extension). Hold this position for 10 seconds. Close your left / right fingers into a gentle fist (flexion). Hold this position for 10 seconds. Slowly return to the starting position. Repeat 10 times. Complete this exercise 1-2 times a day. Wrist flexion Bend your left / right elbow to a 90-degree angle (right angle) with your palm facing the floor. Bend your wrist forward so your fingers point toward the floor (flexion). Hold this position for 10 seconds. Slowly return to the starting position. Repeat 10 times. Complete this exercise 1-2 times a day. Wrist extension Bend your left / right elbow to a 90-degree angle (right angle) with your palm facing the floor. Bend your wrist backward so your fingers point toward the ceiling (extension). Hold this position for 10 seconds. Slowly return to the starting position. Repeat 10 times. Complete this exercise 1-2 times a day. Ulnar deviation Bend your left / right elbow to a 90-degree angle (right angle), and rest your forearm on a table with your palm facing down. Keeping your hand flat on the table, bend your left / right wrist toward your small finger (pinkie). This is ulnar deviation. Hold this  position for 10 seconds. Slowly return to the starting position. Repeat 10 times. Complete this exercise 1-2 times a day. Radial deviation Bend your left / right elbow to a 90-degree angle (right angle), and rest your forearm on a table with your palm facing down. Keeping your hand flat on the table, bend your left / right wrist toward your thumb. This is radial deviation. Hold this position for 10 seconds. Slowly return to the starting position. Repeat 10 times. Complete this exercise 1-2 times a day. Forearm rotation, supination Stand or sit with your left / right elbow bent to a 90-degree angle (right angle) at your side. Position your forearm so that the thumb is facing the ceiling (neutral position). Turn (rotate) your palm up toward the ceiling (supination), stopping when you feel a gentle stretch. Hold this position for 10 seconds. Slowly return to the starting position. Repeat 10 times. Complete this exercise 1-2 times a day. Forearm rotation, pronation Stand or sit with your left / right elbow bent to a 90-degree angle (right angle) at your side. Position your forearm so that the thumb is facing the ceiling (neutral position). Turn (rotate) your palm down toward the floor (pronation), stopping when you feel a gentle stretch. Hold this position for 10 seconds. Slowly return to the starting position. Repeat 10 times. Complete this exercise 1-2 times a day. Wrist flexion stretch  Extend your left / right arm in front of you and turn your palm down toward the floor. If told by your health care provider, bend your left / right arm to a 90-degree angle (  right angle) at your side. Using your uninjured hand, gently press over the back of your left / right hand to bend your wrist and fingers toward the floor (flexion). Go as far as you can to feel a stretch without causing pain. Hold this position for 10 seconds. Slowly return to the starting position. Repeat 10 times. Complete this  exercise 1-2 times a day. Wrist extension stretch  Extend your left / right arm in front of you and turn your palm up toward the ceiling. If told by your health care provider, bend your left / right arm to a 90-degree angle (right angle) at your side. Using your uninjured hand, gently press over the palm of your left / right hand to bend your wrist and fingers toward the floor (extension). Go as far as you can to feel a stretch without causing pain. Hold this position for 10 seconds. Slowly return to the starting position. Repeat 10 times. Complete this exercise 1-2 times a day. Forearm rotation stretch, supination Stand or sit with your arms at your sides. Bend your left / right elbow to a 90-degree angle (right angle). Using your uninjured hand, turn your left / right palm up toward the ceiling (assisted supination) until you feel a gentle stretch in the inside of your forearm. Hold this position for 10 seconds. Slowly return to the starting position. Repeat 10 times. Complete this exercise 1-2 times a day. Forearm rotation stretch, pronation Stand or sit with your arms at your sides. Bend your left / right elbow to a 90-degree angle (right angle). Using your uninjured hand, turn your left / right palm down toward the floor (assisted pronation) until you feel a gentle stretch in the top of your forearm. Hold this position for 10 seconds. Slowly return to the starting position. Repeat 10 times. Complete this exercise 1-2 times a day. Strengthening exercises These exercises build strength and endurance in your wrist and hand. Enduranceis the ability to use your muscles for a long time, even after they get tired. Wrist flexion Sit with your left / right forearm supported on a table. Your elbow should be at waist height. Rest your hand over the edge of the table, palm up. Gently grasp a 5 lb / kg weight (can of soup). Or, hold an exercise band or tube in both hands, keeping your hands at  the same level and hip distance apart. There should be slight tension in the exercise band or tube. Without moving your forearm or elbow, slowly bend your wrist up toward the ceiling (wrist flexion). Hold this position for 10 seconds. Slowly return to the starting position. Repeat 10 times. Complete this exercise 1-2 times a day. Wrist extension Sit with your left / right forearm supported on a table. Your elbow should be at waist height. Rest your hand over the edge of the table, palm down. Gently grasp a 5 lb / kg weight. Or, hold an exercise band or tube in both hands, keeping your hands at the same level and hip distance apart. There should be slight tension in the exercise band or tube. Without moving your forearm or elbow, slowly curl your hand up toward the ceiling (extension). Hold this position for 10 seconds. Slowly return to the starting position. Repeat 10 times. Complete this exercise 1-2 times a day. Forearm rotation, supination  Sit with your left / right forearm supported on a table. Your elbow should be at waist height. Rest your hand over the edge of the   table, palm down. Gently grasp a lightweight hammer near the head. As this exercise gets easier for you, try holding the hammer farther down the handle. Without moving your elbow, slowly turn (rotate) your palm up toward the ceiling (supination). Hold this position for 10 seconds. Slowly return to the starting position. Repeat 10 times. Complete this exercise 1-2 times a day. Forearm rotation, pronation  Sit with your left / right forearm supported on a table. Your elbow should be at waist height. Rest your hand over the edge of the table, palm up. Gently grasp a lightweight hammer near the head. As this exercise gets easier for you, try holding the hammer farther down the handle. Without moving your elbow, slowly turn (rotate) your palm down toward the floor (pronation). Hold this position for 10 seconds. Slowly return  to the starting position. Repeat 10 times. Complete this exercise 1-2 times a day. Grip strengthening  Hold one of these items in your left / right hand: a dense sponge, a stress ball, or a large, rolled sock. Slowly squeeze the object as hard as you can without increasing any pain. Hold your squeeze for 10 seconds. Slowly release your grip. Repeat 10 times. Complete this exercise 1-2 times a day. This information is not intended to replace advice given to you by your health care provider. Make sure you discuss any questions you have with your healthcare provider. Document Revised: 12/17/2019 Document Reviewed: 12/17/2019 Elsevier Patient Education  2022 [employer]  

## 2021-06-30 ENCOUNTER — Encounter: Payer: Self-pay | Admitting: Internal Medicine

## 2021-08-16 ENCOUNTER — Ambulatory Visit: Payer: Medicaid Other

## 2021-10-03 ENCOUNTER — Ambulatory Visit (INDEPENDENT_AMBULATORY_CARE_PROVIDER_SITE_OTHER): Payer: Self-pay | Admitting: *Deleted

## 2021-10-03 ENCOUNTER — Other Ambulatory Visit: Payer: Self-pay

## 2021-10-03 VITALS — Ht 63.0 in | Wt 131.0 lb

## 2021-10-03 DIAGNOSIS — Z1211 Encounter for screening for malignant neoplasm of colon: Secondary | ICD-10-CM

## 2021-10-03 NOTE — Progress Notes (Addendum)
Gastroenterology Pre-Procedure Review  Request Date: 10/03/2021 Requesting Physician: Colleen Can, AGNP, no previous TCS  PATIENT REVIEW QUESTIONS: The patient responded to the following health history questions as indicated:    1. Diabetes Melitis: no 2. Joint replacements in the past 12 months: no 3. Major health problems in the past 3 months: yes, Covid Dec 2022 4. Has an artificial valve or MVP: no 5. Has a defibrillator: no 6. Has been advised in past to take antibiotics in advance of a procedure like teeth cleaning: no 7. Family history of colon cancer: no 8. Alcohol Use: no 9. Illicit drug Use: no 10. History of sleep apnea: no 11. History of coronary artery or other vascular stents placed within the last 12 months: no 12. History of any prior anesthesia complications: yes, n/v  13. Body mass index is 23.21 kg/m.    MEDICATIONS & ALLERGIES:    Patient reports the following regarding taking any blood thinners:   Plavix? no Aspirin? no Coumadin? no Brilinta? no Xarelto? no Eliquis? no Pradaxa? no Savaysa? no Effient? no  Patient confirms/reports the following medications:  Current Outpatient Medications  Medication Sig Dispense Refill   albuterol (PROVENTIL HFA;VENTOLIN HFA) 108 (90 BASE) MCG/ACT inhaler Inhale 2 puffs into the lungs every 6 (six) hours as needed.     Cholecalciferol (VITAMIN D-3) 125 MCG (5000 UT) TABS Take by mouth daily at 6 (six) AM.     lisinopril-hydrochlorothiazide (PRINZIDE,ZESTORETIC) 20-12.5 MG per tablet Take 1 tablet by mouth daily.     lovastatin (MEVACOR) 20 MG tablet Take 20 mg by mouth every morning.     meloxicam (MOBIC) 15 MG tablet Take 15 mg by mouth daily.     Multiple Vitamins-Minerals (WOMENS MULTIVITAMIN PO) Take by mouth daily at 6 (six) AM.     omeprazole (PRILOSEC) 20 MG capsule Take 20 mg by mouth daily.     oxyCODONE (OXY IR/ROXICODONE) 5 MG immediate release tablet Take 1 tablet (5 mg total) by mouth every 4 (four) hours  as needed for moderate pain. 65 tablet 0   PARoxetine (PAXIL) 20 MG tablet Take 20 mg by mouth daily.     TRELEGY ELLIPTA 100-62.5-25 MCG/INH AEPB Inhale 1 puff into the lungs daily.     UNABLE TO FIND daily at 6 (six) AM. Med Name: Immune 24     verapamil (CALAN) 120 MG tablet Take 120 mg by mouth 2 (two) times daily.     vitamin B-12 (CYANOCOBALAMIN) 1000 MCG tablet Take 1,000 mcg by mouth daily.     No current facility-administered medications for this visit.    Patient confirms/reports the following allergies:  Allergies  Allergen Reactions   Codeine Itching and Other (See Comments)    REACTION: Severe itching, feels like skin is crawling    No orders of the defined types were placed in this encounter.   AUTHORIZATION INFORMATION Primary Insurance: Medicaid [place],  [place] #: [id number],  Group #: [id number] Pre-Cert / Auth required: No, not required  SCHEDULE INFORMATION: Procedure has been scheduled as follows:  Date: 10/12/2021, Time:  1:30 Location: [facility] with Dr. Marletta Lor  This Gastroenterology Pre-Precedure Review Form is being routed to the following provider(s): Tana Coast, PA-C

## 2021-10-04 ENCOUNTER — Encounter: Payer: Self-pay | Admitting: *Deleted

## 2021-10-04 MED ORDER — CLENPIQ 10-3.5-12 MG-GM -GM/160ML PO SOLN: 1.0000 | Freq: Once | ORAL | 0 refills | Status: AC

## 2021-10-04 NOTE — Progress Notes (Signed)
Ok to schedule. ASA 3.   

## 2021-10-04 NOTE — Addendum Note (Signed)
Addended by: Noreene Larsson on: 10/04/2021 12:37 PM   Modules accepted: Orders

## 2021-10-05 ENCOUNTER — Encounter: Payer: Self-pay | Admitting: *Deleted

## 2021-10-05 NOTE — Progress Notes (Signed)
Spoke to pt.  Scheduled procedure for 10/12/2021.  She was made aware that her Pre-op appointment is 10/10/2021 at 2:15 at [facility].  Reviewed prep instructions with pt by phone.  Pt verbally repeated all prep instructions back to me.  She is aware that I am mailing out a copy to her.

## 2021-10-09 NOTE — Patient Instructions (Signed)
Kathleen Bennett  10/09/2021     @PREFPERIOPPHARMACY @   Your procedure is scheduled on  10/12/2021.   Report to 10/14/2021 at  1130 A.M.   Call this number if you have problems the morning of surgery:  [phone]   Remember:  Follow the diet and prep instructions given to you by the office.       Use your nebulizer and your inhaler before you come and bring your rescue inhaler with you.    Take these medicines the morning of surgery with A SIP OF WATER        mobic(if needed), prlosec, oxycodone(If needed), paxil, verapamil.     Do not wear jewelry, make-up or nail polish.  Do not wear lotions, powders, or perfumes, or deodorant.  Do not shave 48 hours prior to surgery.  Men may shave face and neck.  Do not bring valuables to the hospital.  [facility] is not responsible for any belongings or valuables.  Contacts, dentures or bridgework may not be worn into surgery.  Leave your suitcase in the car.  After surgery it may be brought to your room.  For patients admitted to the hospital, discharge time will be determined by your treatment team.  Patients discharged the day of surgery will not be allowed to drive home and must have someone with them for 24 hours.    Special instructions:   DO NOT smoke tobacco or vape for 24 hours before your procedure.  Please read over the following fact sheets that you were given. Anesthesia Post-op Instructions and Care and Recovery After Surgery      Colonoscopy, Adult, Care After This sheet gives you information about how to care for yourself after your procedure. Your health care provider may also give you more specific instructions. If you have problems or questions, contact your health care provider. What can I expect after the procedure? After the procedure, it is common to have: A small amount of blood in your stool for 24 hours after the procedure. Some gas. Mild cramping or bloating of your abdomen. Follow these  instructions at home: Eating and drinking  Drink enough fluid to keep your urine pale yellow. Follow instructions from your health care provider about eating or drinking restrictions. Resume your normal diet as instructed by your health care provider. Avoid heavy or fried foods that are hard to digest. Activity Rest as told by your health care provider. Avoid sitting for a long time without moving. Get up to take short walks every 1-2 hours. This is important to improve blood flow and breathing. Ask for help if you feel weak or unsteady. Return to your normal activities as told by your health care provider. Ask your health care provider what activities are safe for you. Managing cramping and bloating  Try walking around when you have cramps or feel bloated. Apply heat to your abdomen as told by your health care provider. Use the heat source that your health care provider recommends, such as a moist heat pack or a heating pad. Place a towel between your skin and the heat source. Leave the heat on for 20-30 minutes. Remove the heat if your skin turns bright red. This is especially important if you are unable to feel pain, heat, or cold. You may have a greater risk of getting burned. General instructions If you were given a sedative during the procedure, it can affect you for several hours. Do not drive  or operate machinery until your health care provider says that it is safe. For the first 24 hours after the procedure: Do not sign important documents. Do not drink alcohol. Do your regular daily activities at a slower pace than normal. Eat soft foods that are easy to digest. Take over-the-counter and prescription medicines only as told by your health care provider. Keep all follow-up visits as told by your health care provider. This is important. Contact a health care provider if: You have blood in your stool 2-3 days after the procedure. Get help right away if you have: More than a small  spotting of blood in your stool. Large blood clots in your stool. Swelling of your abdomen. Nausea or vomiting. A fever. Increasing pain in your abdomen that is not relieved with medicine. Summary After the procedure, it is common to have a small amount of blood in your stool. You may also have mild cramping and bloating of your abdomen. If you were given a sedative during the procedure, it can affect you for several hours. Do not drive or operate machinery until your health care provider says that it is safe. Get help right away if you have a lot of blood in your stool, nausea or vomiting, a fever, or increased pain in your abdomen. This information is not intended to replace advice given to you by your health care provider. Make sure you discuss any questions you have with your health care provider. Document Revised: 06/12/2019 Document Reviewed: 03/02/2019 Elsevier Patient Education  2022 Elsevier Inc. Monitored Anesthesia Care, Care After This sheet gives you information about how to care for yourself after your procedure. Your health care provider may also give you more specific instructions. If you have problems or questions, contact your health care provider. What can I expect after the procedure? After the procedure, it is common to have: Tiredness. Forgetfulness about what happened after the procedure. Impaired judgment for important decisions. Nausea or vomiting. Some difficulty with balance. Follow these instructions at home: For the time period you were told by your health care provider:   Rest as needed. Do not participate in activities where you could fall or become injured. Do not drive or use machinery. Do not drink alcohol. Do not take sleeping pills or medicines that cause drowsiness. Do not make important decisions or sign legal documents. Do not take care of children on your own. Eating and drinking Follow the diet that is recommended by your health care  provider. Drink enough fluid to keep your urine pale yellow. If you vomit: Drink water, juice, or soup when you can drink without vomiting. Make sure you have little or no nausea before eating solid foods. General instructions Have a responsible adult stay with you for the time you are told. It is important to have someone help care for you until you are awake and alert. Take over-the-counter and prescription medicines only as told by your health care provider. If you have sleep apnea, surgery and certain medicines can increase your risk for breathing problems. Follow instructions from your health care provider about wearing your sleep device: Anytime you are sleeping, including during daytime naps. While taking prescription pain medicines, sleeping medicines, or medicines that make you drowsy. Avoid smoking. Keep all follow-up visits as told by your health care provider. This is important. Contact a health care provider if: You keep feeling nauseous or you keep vomiting. You feel light-headed. You are still sleepy or having trouble with balance after 24 hours.  You develop a rash. You have a fever. You have redness or swelling around the IV site. Get help right away if: You have trouble breathing. You have new-onset confusion at home. Summary For several hours after your procedure, you may feel tired. You may also be forgetful and have poor judgment. Have a responsible adult stay with you for the time you are told. It is important to have someone help care for you until you are awake and alert. Rest as told. Do not drive or operate machinery. Do not drink alcohol or take sleeping pills. Get help right away if you have trouble breathing, or if you suddenly become confused. This information is not intended to replace advice given to you by your health care provider. Make sure you discuss any questions you have with your health care provider. Document Revised: 04/21/2020 Document Reviewed:  07/09/2019 Elsevier Patient Education  2022 [employer].

## 2021-10-10 ENCOUNTER — Encounter (HOSPITAL_COMMUNITY): Payer: Self-pay

## 2021-10-10 ENCOUNTER — Encounter (HOSPITAL_COMMUNITY)
Admission: RE | Admit: 2021-10-10 | Discharge: 2021-10-10 | Disposition: A | Discharge status: Home / Self Care | Payer: Medicaid Other | Source: Ambulatory Visit | Attending: Internal Medicine | Admitting: Internal Medicine

## 2021-10-10 VITALS — BP 125/64 | HR 75 | Temp 97.8°F | Ht 63.0 in | Wt 131.0 lb

## 2021-10-10 DIAGNOSIS — Z01812 Encounter for preprocedural laboratory examination: Secondary | ICD-10-CM | POA: Insufficient documentation

## 2021-10-10 DIAGNOSIS — Z5181 Encounter for therapeutic drug level monitoring: Secondary | ICD-10-CM | POA: Diagnosis not present

## 2021-10-10 DIAGNOSIS — Z79899 Other long term (current) drug therapy: Secondary | ICD-10-CM | POA: Diagnosis not present

## 2021-10-10 HISTORY — DX: Other specified postprocedural states: Z98.890

## 2021-10-10 HISTORY — DX: Nausea with vomiting, unspecified: R11.2

## 2021-10-10 HISTORY — DX: Cardiac murmur, unspecified: R01.1

## 2021-10-10 LAB — BASIC METABOLIC PANEL
Anion gap: 11 (ref 5–15)
BUN: 13 mg/dL (ref 8–23)
CO2: 24 mmol/L (ref 22–32)
Calcium: 9.6 mg/dL (ref 8.9–10.3)
Chloride: 97 mmol/L — ABNORMAL LOW (ref 98–111)
Creatinine, Ser: 0.8 mg/dL (ref 0.44–1.00)
GFR, Estimated: >60 mL/min (ref >60)
Glucose, Bld: 91 mg/dL (ref 70–99)
Potassium: 4.1 mmol/L (ref 3.5–5.1)
Sodium: 132 mmol/L — ABNORMAL LOW (ref 135–145)

## 2021-10-12 ENCOUNTER — Encounter (HOSPITAL_COMMUNITY): Payer: Self-pay

## 2021-10-12 ENCOUNTER — Encounter (HOSPITAL_COMMUNITY)
Admission: RE | Disposition: A | Discharge status: Home / Self Care | Payer: Self-pay | Source: Ambulatory Visit | Attending: Internal Medicine

## 2021-10-12 ENCOUNTER — Ambulatory Visit (HOSPITAL_COMMUNITY): Payer: Medicaid Other | Admitting: Anesthesiology

## 2021-10-12 ENCOUNTER — Ambulatory Visit (HOSPITAL_BASED_OUTPATIENT_CLINIC_OR_DEPARTMENT_OTHER): Payer: Medicaid Other | Admitting: Anesthesiology

## 2021-10-12 ENCOUNTER — Ambulatory Visit (HOSPITAL_COMMUNITY)
Admission: RE | Admit: 2021-10-12 | Discharge: 2021-10-12 | Disposition: A | Discharge status: Home / Self Care | Payer: Medicaid Other | Source: Ambulatory Visit | Attending: Internal Medicine | Admitting: Internal Medicine

## 2021-10-12 DIAGNOSIS — K648 Other hemorrhoids: Secondary | ICD-10-CM | POA: Diagnosis not present

## 2021-10-12 DIAGNOSIS — K5730 Diverticulosis of large intestine without perforation or abscess without bleeding: Secondary | ICD-10-CM | POA: Insufficient documentation

## 2021-10-12 DIAGNOSIS — F17210 Nicotine dependence, cigarettes, uncomplicated: Secondary | ICD-10-CM | POA: Insufficient documentation

## 2021-10-12 DIAGNOSIS — K529 Noninfective gastroenteritis and colitis, unspecified: Secondary | ICD-10-CM

## 2021-10-12 DIAGNOSIS — J449 Chronic obstructive pulmonary disease, unspecified: Secondary | ICD-10-CM | POA: Insufficient documentation

## 2021-10-12 DIAGNOSIS — Z1211 Encounter for screening for malignant neoplasm of colon: Secondary | ICD-10-CM | POA: Diagnosis present

## 2021-10-12 DIAGNOSIS — N289 Disorder of kidney and ureter, unspecified: Secondary | ICD-10-CM | POA: Diagnosis not present

## 2021-10-12 DIAGNOSIS — K219 Gastro-esophageal reflux disease without esophagitis: Secondary | ICD-10-CM | POA: Insufficient documentation

## 2021-10-12 DIAGNOSIS — I10 Essential (primary) hypertension: Secondary | ICD-10-CM | POA: Insufficient documentation

## 2021-10-12 DIAGNOSIS — F419 Anxiety disorder, unspecified: Secondary | ICD-10-CM | POA: Insufficient documentation

## 2021-10-12 HISTORY — PX: COLONOSCOPY WITH PROPOFOL: SHX5780

## 2021-10-12 HISTORY — PX: BIOPSY: SHX5522

## 2021-10-12 SURGERY — COLONOSCOPY WITH PROPOFOL
Anesthesia: General

## 2021-10-12 MED ORDER — PROPOFOL 10 MG/ML IV BOLUS
INTRAVENOUS | Status: DC | PRN
  Administered 2021-10-12: 80 mg via INTRAVENOUS
  Administered 2021-10-12: 30 mg via INTRAVENOUS
  Administered 2021-10-12: 20 mg via INTRAVENOUS

## 2021-10-12 MED ORDER — PROPOFOL 500 MG/50ML IV EMUL
INTRAVENOUS | Status: DC | PRN
Start: 2021-10-12 — End: 2021-10-12
  Administered 2021-10-12: 125 ug/kg/min via INTRAVENOUS

## 2021-10-12 MED ORDER — LACTATED RINGERS IV SOLN
INTRAVENOUS | Status: DC
  Administered 2021-10-12: 1000 mL via INTRAVENOUS

## 2021-10-12 MED ORDER — LIDOCAINE 2% (20 MG/ML) 5 ML SYRINGE
INTRAMUSCULAR | Status: DC | PRN
  Administered 2021-10-12: 40 mg via INTRAVENOUS

## 2021-10-12 NOTE — H&P (Signed)
Primary Care Physician:  Oval Linsey, MD (Inactive) Primary Gastroenterologist:  Dr. Marletta Lor  Pre-Procedure History & Physical: HPI:  Kathleen Bennett is a 61 y.o. female is here for first ever colonoscopy for colon cancer screening purposes.  Patient denies any family history of colorectal cancer.  No melena or hematochezia.  No abdominal pain or unintentional weight loss.  No change in bowel habits.  Overall feels well from a GI standpoint.  Past Medical History:  Diagnosis Date   Anxiety    COPD (chronic obstructive pulmonary disease) (HCC)    DDD (degenerative disc disease), lumbar    Diverticulitis    GERD (gastroesophageal reflux disease)    Heart murmur    High cholesterol    Hypertension    PONV (postoperative nausea and vomiting)     Past Surgical History:  Procedure Laterality Date   ABDOMINAL HYSTERECTOMY      Prior to Admission medications   Medication Sig Start Date End Date Taking? Authorizing Provider  acetaminophen (TYLENOL) 500 MG tablet Take 500-1,000 mg by mouth every 6 (six) hours as needed (for pain.).   Yes [provider]  albuterol (PROVENTIL HFA;VENTOLIN HFA) 108 (90 BASE) MCG/ACT inhaler Inhale 2 puffs into the lungs every 6 (six) hours as needed for shortness of breath or wheezing.   Yes [provider]  albuterol (PROVENTIL) (2.5 MG/3ML) 0.083% nebulizer solution Take 2.5 mg by nebulization every 6 (six) hours as needed for wheezing or shortness of breath.   Yes [provider]  Aspirin-Acetaminophen-Caffeine (GOODYS EXTRA STRENGTH) [phone] MG PACK Take 1 packet by mouth 2 (two) times daily as needed (pain.).   Yes [provider]  Cholecalciferol (VITAMIN D-3) 125 MCG (5000 UT) TABS Take 5,000 Units by mouth in the morning.   Yes [provider]  lisinopril-hydrochlorothiazide (PRINZIDE,ZESTORETIC) 20-12.5 MG per tablet Take 1 tablet by mouth in the morning.   Yes [provider]  lovastatin  (MEVACOR) 20 MG tablet Take 20 mg by mouth every morning.   Yes [provider]  meloxicam (MOBIC) 15 MG tablet Take 15 mg by mouth in the morning. 10/02/21  Yes [provider]  Misc Natural Products (IMMUNE ESSENTIALS PO) Take 1 tablet by mouth daily. Nature's Bounty Immune 24 Hour + ( 1000mg  Ester-C+ Vitamin D & Zinc, with Elderberry & Echinacea)   Yes [provider]  Multiple Vitamins-Minerals (WOMENS MULTIVITAMIN PO) Take 1 tablet by mouth in the morning.   Yes [provider]  omeprazole (PRILOSEC) 20 MG capsule Take 20 mg by mouth daily before breakfast.   Yes [provider]  oxyCODONE (OXY IR/ROXICODONE) 5 MG immediate release tablet Take 1 tablet (5 mg total) by mouth every 4 (four) hours as needed for moderate pain. Patient taking differently: Take 5 mg by mouth 3 (three) times daily as needed for moderate pain. 11/23/13  Yes 01/23/14, MD  PARoxetine (PAXIL) 20 MG tablet Take 20 mg by mouth in the morning.   Yes [provider]  simethicone (MYLICON) 125 MG chewable tablet Chew 125 mg by mouth every 6 (six) hours as needed for flatulence.   Yes [provider]  TRELEGY ELLIPTA 100-62.5-25 MCG/INH AEPB Inhale 1 puff into the lungs in the morning. 10/31/20  Yes [provider]  verapamil (CALAN) 120 MG tablet Take 240 mg by mouth in the morning.   Yes [provider]  vitamin B-12 (CYANOCOBALAMIN) 1000 MCG tablet Take 1,000 mcg by mouth in the morning.  Yes [provider]    Allergies as of 10/04/2021 - Review Complete 10/03/2021  Allergen Reaction Noted   Codeine Itching and Other (See Comments) 04/04/2012    History reviewed. No pertinent family history.  Social History   Socioeconomic History   Marital status: Married    Spouse name: Not on file   Number of children: Not on file   Years of education: Not on file   Highest education level: Not on file  Occupational History   Not on  file  Tobacco Use   Smoking status: Every Day    Packs/day: 1.50    Years: 40.00    Pack years: 60.00    Types: Cigarettes   Smokeless tobacco: Never  Substance and Sexual Activity   Alcohol use: Yes    Comment: beer daily x 6   Drug use: No   Sexual activity: Not on file  Other Topics Concern   Not on file  Social History Narrative   Not on file   Social Determinants of Health   Financial Resource Strain: Not on file  Food Insecurity: Not on file  Transportation Needs: Not on file  Physical Activity: Not on file  Stress: Not on file  Social Connections: Not on file  Intimate Partner Violence: Not on file    Review of Systems: See HPI, otherwise negative ROS  Physical Exam: Vital signs in last 24 hours: Temp:  [98.4 F (36.9 C)] 98.4 F (36.9 C) (02/23 1144) Resp:  [15] 15 (02/23 1144) BP: (117)/(80) 117/80 (02/23 1144) SpO2:  [93 %] 93 % (02/23 1144)   General:   Alert,  Well-developed, well-nourished, pleasant and cooperative in NAD Head:  Normocephalic and atraumatic. Eyes:  Sclera clear, no icterus.   Conjunctiva pink. Ears:  Normal auditory acuity. Nose:  No deformity, discharge,  or lesions. Mouth:  No deformity or lesions, dentition normal. Neck:  Supple; no masses or thyromegaly. Lungs:  Clear throughout to auscultation.   No wheezes, crackles, or rhonchi. No acute distress. Heart:  Regular rate and rhythm; no murmurs, clicks, rubs,  or gallops. Abdomen:  Soft, nontender and nondistended. No masses, hepatosplenomegaly or hernias noted. Normal bowel sounds, without guarding, and without rebound.   Msk:  Symmetrical without gross deformities. Normal posture. Extremities:  Without clubbing or edema. Neurologic:  Alert and  oriented x4;  grossly normal neurologically. Skin:  Intact without significant lesions or rashes. Cervical Nodes:  No significant cervical adenopathy. Psych:  Alert and cooperative. Normal mood and affect.  Impression/Plan: Kathleen Bennett is here for a colonoscopy to be performed for colon cancer screening purposes.  The risks of the procedure including infection, bleed, or perforation as well as benefits, limitations, alternatives and imponderables have been reviewed with the patient. Questions have been answered. All parties agreeable.

## 2021-10-12 NOTE — Anesthesia Postprocedure Evaluation (Signed)
Anesthesia Post Note  Patient: Kathleen Bennett  Procedure(s) Performed: COLONOSCOPY WITH PROPOFOL BIOPSY  Patient location during evaluation: Phase II Anesthesia Type: General Level of consciousness: awake and alert and oriented Pain management: pain level controlled Vital Signs Assessment: post-procedure vital signs reviewed and stable Respiratory status: spontaneous breathing, nonlabored ventilation and respiratory function stable Cardiovascular status: blood pressure returned to baseline and stable Postop Assessment: no apparent nausea or vomiting Anesthetic complications: no   No notable events documented.   Last Vitals:  Vitals:   10/12/21 1144 10/12/21 1309  BP: 117/80 129/79  Pulse:  79  Resp: 15 16  Temp: 36.9 C 36.5 C  SpO2: 93% 96%    Last Pain:  Vitals:   10/12/21 1309  TempSrc: Oral  PainSc: 0-No pain                 Lakea Mittelman C Lakishia Bourassa

## 2021-10-12 NOTE — Op Note (Signed)
[facility] Patient Name: Kathleen Bennett Procedure Date: 10/12/2021 12:01 PM MRN: [id number] Date of Birth: [birth year] Attending MD: Elon Alas. Abbey Chatters DO CSN: [id number] Age: 61 Admit Type: Outpatient Procedure:                Colonoscopy Indications:              Screening for colorectal malignant neoplasm Providers:                Elon Alas. Abbey Chatters, DO, Crystal Page, Aram Candela Referring MD:              Medicines:                See the Anesthesia note for documentation of the                            administered medications Complications:            No immediate complications. Estimated Blood Loss:     Estimated blood loss was minimal. Procedure:                Pre-Anesthesia Assessment:                           - The anesthesia plan was to use monitored                            anesthesia care (MAC).                           After obtaining informed consent, the colonoscope                            was passed under direct vision. Throughout the                            procedure, the patient's blood pressure, pulse, and                            oxygen saturations were monitored continuously. The                            [id number]-HQ190L ([id number]) scope was introduced through                            the anus and advanced to the the cecum, identified                            by appendiceal orifice and ileocecal valve. The                            colonoscopy was performed without difficulty. The                            patient tolerated the procedure well. The quality                            of  the bowel preparation was evaluated using the                            BBPS [facility] Bowel Preparation Scale) with scores                            of: Right Colon = 2 (minor amount of residual                            staining, small fragments of stool and/or opaque                            liquid, but mucosa seen well), Transverse Colon = 3                             (entire mucosa seen well with no residual staining,                            small fragments of stool or opaque liquid) and Left                            Colon = 3 (entire mucosa seen well with no residual                            staining, small fragments of stool or opaque                            liquid). The total BBPS score equals 8. The quality                            of the bowel preparation was good. Scope In: 12:35:33 PM Scope Out: 1:03:40 PM Scope Withdrawal Time: 0 hours 16 minutes 26 seconds  Total Procedure Duration: 0 hours 28 minutes 7 seconds  Findings:      The perianal and digital rectal examinations were normal.      Non-bleeding internal hemorrhoids were found during endoscopy.      Multiple small-mouthed diverticula were found in the sigmoid colon.      Patchy mild inflammation characterized by congestion (edema) and       erythema was found in the entire colon. Segmental biopsies were taken       with a cold forceps for histology. Impression:               - Non-bleeding internal hemorrhoids.                           - Diverticulosis in the sigmoid colon.                           - Patchy mild inflammation was found in the entire                            examined colon secondary to colitis. Biopsied. Moderate Sedation:      Per Anesthesia Care Recommendation:           -  Patient has a contact number available for                            emergencies. The signs and symptoms of potential                            delayed complications were discussed with the                            patient. Return to normal activities tomorrow.                            Written discharge instructions were provided to the                            patient.                           - Resume previous diet.                           - Continue present medications.                           - Await pathology results.                           -  Repeat colonoscopy date to be determined after                            pending pathology results are reviewed for                            surveillance based on pathology results. Procedure Code(s):        --- Professional ---                           [phone], Colonoscopy, flexible; with biopsy, single                            or multiple Diagnosis Code(s):        --- Professional ---                           Z12.11, Encounter for screening for malignant                            neoplasm of colon                           K64.8, Other hemorrhoids                           K52.9, Noninfective gastroenteritis and colitis,                            unspecified  K57.30, Diverticulosis of large intestine without                            perforation or abscess without bleeding CPT copyright 2019 American Medical Association. All rights reserved. The codes documented in this report are preliminary and upon coder review may  be revised to meet current compliance requirements. Elon Alas. Abbey Chatters, DO [place] Abbey Chatters, DO 10/12/2021 1:06:46 PM This report has been signed electronically. Number of Addenda: 0

## 2021-10-12 NOTE — Anesthesia Preprocedure Evaluation (Signed)
Anesthesia Evaluation  Patient identified by MRN, date of birth, ID band Patient awake    Reviewed: Allergy & Precautions, NPO status , Patient's Chart, lab work & pertinent test results  History of Anesthesia Complications (+) PONV and history of anesthetic complications  Airway Mallampati: II  TM Distance: >3 FB Neck ROM: Full    Dental  (+) Dental Advisory Given, Missing   Pulmonary shortness of breath and with exertion, COPD,  COPD inhaler, Current Smoker and Patient abstained from smoking.,    Pulmonary exam normal breath sounds clear to auscultation       Cardiovascular hypertension, Pt. on medications Normal cardiovascular exam+ Valvular Problems/Murmurs  Rhythm:Regular Rate:Normal     Neuro/Psych PSYCHIATRIC DISORDERS Anxiety negative neurological ROS     GI/Hepatic GERD  Medicated and Controlled,(+)     substance abuse  alcohol use,   Endo/Other  negative endocrine ROS  Renal/GU Renal InsufficiencyRenal disease  negative genitourinary   Musculoskeletal  (+) Arthritis , Osteoarthritis,    Abdominal   Peds negative pediatric ROS (+)  Hematology negative hematology ROS (+)   Anesthesia Other Findings   Reproductive/Obstetrics negative OB ROS                           Anesthesia Physical Anesthesia Plan  ASA: 3  Anesthesia Plan: General   Post-op Pain Management: Minimal or no pain anticipated   Induction: Intravenous  PONV Risk Score and Plan: TIVA  Airway Management Planned: Nasal Cannula and Natural Airway  Additional Equipment:   Intra-op Plan:   Post-operative Plan:   Informed Consent: I have reviewed the patients History and Physical, chart, labs and discussed the procedure including the risks, benefits and alternatives for the proposed anesthesia with the patient or authorized representative who has indicated his/her understanding and acceptance.     Dental  advisory given  Plan Discussed with: CRNA and Surgeon  Anesthesia Plan Comments:        Anesthesia Quick Evaluation

## 2021-10-12 NOTE — Discharge Instructions (Signed)
°  Colonoscopy Discharge Instructions  Read the instructions outlined below and refer to this sheet in the next few weeks. These discharge instructions provide you with general information on caring for yourself after you leave the hospital. Your doctor may also give you specific instructions. While your treatment has been planned according to the most current medical practices available, unavoidable complications occasionally occur.   ACTIVITY You may resume your regular activity, but move at a slower pace for the next 24 hours.  Take frequent rest periods for the next 24 hours.  Walking will help get rid of the air and reduce the bloated feeling in your belly (abdomen).  No driving for 24 hours (because of the medicine (anesthesia) used during the test).   Do not sign any important legal documents or operate any machinery for 24 hours (because of the anesthesia used during the test).  NUTRITION Drink plenty of fluids.  You may resume your normal diet as instructed by your doctor.  Begin with a light meal and progress to your normal diet. Heavy or fried foods are harder to digest and may make you feel sick to your stomach (nauseated).  Avoid alcoholic beverages for 24 hours or as instructed.  MEDICATIONS You may resume your normal medications unless your doctor tells you otherwise.  WHAT YOU CAN EXPECT TODAY Some feelings of bloating in the abdomen.  Passage of more gas than usual.  Spotting of blood in your stool or on the toilet paper.  IF YOU HAD POLYPS REMOVED DURING THE COLONOSCOPY: No aspirin products for 7 days or as instructed.  No alcohol for 7 days or as instructed.  Eat a soft diet for the next 24 hours.  FINDING OUT THE RESULTS OF YOUR TEST Not all test results are available during your visit. If your test results are not back during the visit, make an appointment with your caregiver to find out the results. Do not assume everything is normal if you have not heard from your  caregiver or the medical facility. It is important for you to follow up on all of your test results.  SEEK IMMEDIATE MEDICAL ATTENTION IF: You have more than a spotting of blood in your stool.  Your belly is swollen (abdominal distention).  You are nauseated or vomiting.  You have a temperature over 101.  You have abdominal pain or discomfort that is severe or gets worse throughout the day.   Your colonoscopy was relatively unremarkable.  I did not find any polyps or evidence of colon cancer.  Appeared you had patchy inflammation throughout your entire colon though.  I did take numerous biopsies and we will see what these show.  Further recommendations to follow.  If biopsy is okay would recommend repeat colonoscopy in 10 years for screening purposes.  We will contact you next week with these results.   I hope you have a great rest of your week!  Hennie Duos. Marletta Lor, D.O. Gastroenterology and Hepatology [facility]

## 2021-10-12 NOTE — Transfer of Care (Signed)
Immediate Anesthesia Transfer of Care Note  Patient: Kathleen Bennett  Procedure(s) Performed: COLONOSCOPY WITH PROPOFOL BIOPSY  Patient Location: Short Stay  Anesthesia Type:MAC  Level of Consciousness: awake, alert , oriented and patient cooperative  Airway & Oxygen Therapy: Patient Spontanous Breathing  Post-op Assessment: Report given to RN, Post -op Vital signs reviewed and stable and Patient moving all extremities  Post vital signs: Reviewed and stable  Last Vitals:  Vitals Value Taken Time  BP    Temp    Pulse    Resp    SpO2      Last Pain:  Vitals:   10/12/21 1232  TempSrc:   PainSc: 0-No pain      Patients Stated Pain Goal: 6 (83/38/25 0539)  Complications: No notable events documented.

## 2021-10-17 ENCOUNTER — Encounter (HOSPITAL_COMMUNITY): Payer: Self-pay | Admitting: Internal Medicine

## 2021-10-17 LAB — SURGICAL PATHOLOGY

## 2021-11-30 ENCOUNTER — Other Ambulatory Visit (HOSPITAL_COMMUNITY): Payer: Self-pay | Admitting: Nurse Practitioner

## 2021-11-30 DIAGNOSIS — Z1231 Encounter for screening mammogram for malignant neoplasm of breast: Secondary | ICD-10-CM

## 2021-12-28 ENCOUNTER — Ambulatory Visit (HOSPITAL_COMMUNITY)
Admission: RE | Admit: 2021-12-28 | Discharge: 2021-12-28 | Disposition: A | Discharge status: Home / Self Care | Payer: Medicaid Other | Source: Ambulatory Visit | Attending: Nurse Practitioner | Admitting: Nurse Practitioner

## 2021-12-28 DIAGNOSIS — Z1231 Encounter for screening mammogram for malignant neoplasm of breast: Secondary | ICD-10-CM | POA: Diagnosis present

## 2022-01-22 NOTE — Progress Notes (Deleted)
GI Office Note    Referring Provider: No ref. provider found Primary Care Physician:  Damaris Schooner Janine Limbo, NP Primary GI: Dr. Marletta Lor  Date:  01/22/2022  ID:  Kathleen Bennett, DOB Apr 17, 1961, MRN [id number]   Chief Complaint   No chief complaint on file.    History of Present Illness  Kathleen Bennett is a 61 y.o. female presenting today for post colonoscopy follow up.  Colonoscopy 10/12/21 -nonbleeding internal hemorrhoids, diverticulosis in the sigmoid colon, patchy mild inflammation found in the entire examined colon secondary to colitis.  Cecal, ascending, transverse, descending, and sigmoid biopsies with benign colonic mucosa with mild edema and reactive changes, differential includes drug reaction resolving infection.  Repeat recommended in 10 years.  Today: Diarrhea? Constipation? Abdominal pain?   Past Medical History:  Diagnosis Date   Anxiety    COPD (chronic obstructive pulmonary disease) (HCC)    DDD (degenerative disc disease), lumbar    Diverticulitis    GERD (gastroesophageal reflux disease)    Heart murmur    High cholesterol    Hypertension    PONV (postoperative nausea and vomiting)     Past Surgical History:  Procedure Laterality Date   ABDOMINAL HYSTERECTOMY     BIOPSY  10/12/2021   Procedure: BIOPSY;  Surgeon: Lanelle Bal, DO;  Location: [facility];  Service: Endoscopy;;   COLONOSCOPY WITH PROPOFOL N/A 10/12/2021   Procedure: COLONOSCOPY WITH PROPOFOL;  Surgeon: Lanelle Bal, DO;  Location: [facility];  Service: Endoscopy;  Laterality: N/A;  1:30 / ASA 3    Current Outpatient Medications  Medication Sig Dispense Refill   acetaminophen (TYLENOL) 500 MG tablet Take 500-1,000 mg by mouth every 6 (six) hours as needed (for pain.).     albuterol (PROVENTIL HFA;VENTOLIN HFA) 108 (90 BASE) MCG/ACT inhaler Inhale 2 puffs into the lungs every 6 (six) hours as needed for shortness of breath or wheezing.     albuterol (PROVENTIL) (2.5 MG/3ML)  0.083% nebulizer solution Take 2.5 mg by nebulization every 6 (six) hours as needed for wheezing or shortness of breath.     Aspirin-Acetaminophen-Caffeine (GOODYS EXTRA STRENGTH) 500-325-65 MG PACK Take 1 packet by mouth 2 (two) times daily as needed (pain.).     Cholecalciferol (VITAMIN D-3) 125 MCG (5000 UT) TABS Take 5,000 Units by mouth in the morning.     lisinopril-hydrochlorothiazide (PRINZIDE,ZESTORETIC) 20-12.5 MG per tablet Take 1 tablet by mouth in the morning.     lovastatin (MEVACOR) 20 MG tablet Take 20 mg by mouth every morning.     meloxicam (MOBIC) 15 MG tablet Take 15 mg by mouth in the morning.     Misc Natural Products (IMMUNE ESSENTIALS PO) Take 1 tablet by mouth daily. Nature's Bounty Immune 24 Hour + ( 1000mg  Ester-C+ Vitamin D & Zinc, with Elderberry & Echinacea)     Multiple Vitamins-Minerals (WOMENS MULTIVITAMIN PO) Take 1 tablet by mouth in the morning.     omeprazole (PRILOSEC) 20 MG capsule Take 20 mg by mouth daily before breakfast.     oxyCODONE (OXY IR/ROXICODONE) 5 MG immediate release tablet Take 1 tablet (5 mg total) by mouth every 4 (four) hours as needed for moderate pain. (Patient taking differently: Take 5 mg by mouth 3 (three) times daily as needed for moderate pain.) 65 tablet 0   PARoxetine (PAXIL) 20 MG tablet Take 20 mg by mouth in the morning.     simethicone (MYLICON) 125 MG chewable tablet Chew 125 mg by mouth every  6 (six) hours as needed for flatulence.     TRELEGY ELLIPTA 100-62.5-25 MCG/INH AEPB Inhale 1 puff into the lungs in the morning.     verapamil (CALAN) 120 MG tablet Take 240 mg by mouth in the morning.     vitamin B-12 (CYANOCOBALAMIN) 1000 MCG tablet Take 1,000 mcg by mouth in the morning.     No current facility-administered medications for this visit.    Allergies as of 01/23/2022 - Review Complete 10/12/2021  Allergen Reaction Noted   Codeine Itching and Other (See Comments) 04/04/2012    No family history on file.  Social  History   Socioeconomic History   Marital status: Married    Spouse name: Not on file   Number of children: Not on file   Years of education: Not on file   Highest education level: Not on file  Occupational History   Not on file  Tobacco Use   Smoking status: Every Day    Packs/day: 1.50    Years: 40.00    Pack years: 60.00    Types: Cigarettes   Smokeless tobacco: Never  Substance and Sexual Activity   Alcohol use: Yes    Comment: beer daily x 6   Drug use: No   Sexual activity: Not on file  Other Topics Concern   Not on file  Social History Narrative   Not on file   Social Determinants of Health   Financial Resource Strain: Not on file  Food Insecurity: Not on file  Transportation Needs: Not on file  Physical Activity: Not on file  Stress: Not on file  Social Connections: Not on file     Review of Systems   Gen: Denies fever, chills, anorexia. Denies fatigue, weakness, weight loss.  CV: Denies chest pain, palpitations, syncope, peripheral edema, and claudication. Resp: Denies dyspnea at rest, cough, wheezing, coughing up blood, and pleurisy. GI: Denies vomiting blood, jaundice, and fecal incontinence.   Denies dysphagia or odynophagia. Derm: Denies rash, itching, dry skin Psych: Denies depression, anxiety, memory loss, confusion. No homicidal or suicidal ideation.  Heme: Denies bruising, bleeding, and enlarged lymph nodes.   Physical Exam   There were no vitals taken for this visit.  General:   Alert and oriented. No distress noted. Pleasant and cooperative.  Head:  Normocephalic and atraumatic. Eyes:  Conjuctiva clear without scleral icterus. Mouth:  Oral mucosa pink and moist. Good dentition. No lesions. Lungs:  Clear to auscultation bilaterally. No wheezes, rales, or rhonchi. No distress.  Heart:  S1, S2 present without murmurs appreciated.  Abdomen:  +BS, soft, non-tender and non-distended. No rebound or guarding. No HSM or masses noted. Rectal:  *** Msk:  Symmetrical without gross deformities. Normal posture. Extremities:  Without edema. Neurologic:  Alert and  oriented x4 Psych:  Alert and cooperative. Normal mood and affect.   Assessment  Kathleen Bennett is a 61 y.o. female with a history of anxiety, COPD, GERD, HTN, and diverticulitis presenting today for post procedure follow up.  History of colitis: Last colonoscopy February 2023 with diverticulosis in the sigmoid colon, patchy mild erythema throughout the colon, biopsies consistent with mild edema and reactive changes likely secondary to colon prep or recent infection.   PLAN   *** Follow up as needed    Brooke Bonito, MSN, FNP-BC, AGACNP-BC [facility]

## 2022-01-23 ENCOUNTER — Ambulatory Visit: Payer: Medicaid Other | Admitting: Gastroenterology

## 2022-03-21 IMAGING — DX DG ABDOMEN 2V
2 series · 3 of 3 positions shown · non-contrast
Comparison: None.

CLINICAL DATA: Shortness of breath.

EXAM:
ABDOMEN - 2 VIEW

[Series 1: abdomen erect ap · 0.14mm/px · 2 of 2 slices shown]
[im 1/2]
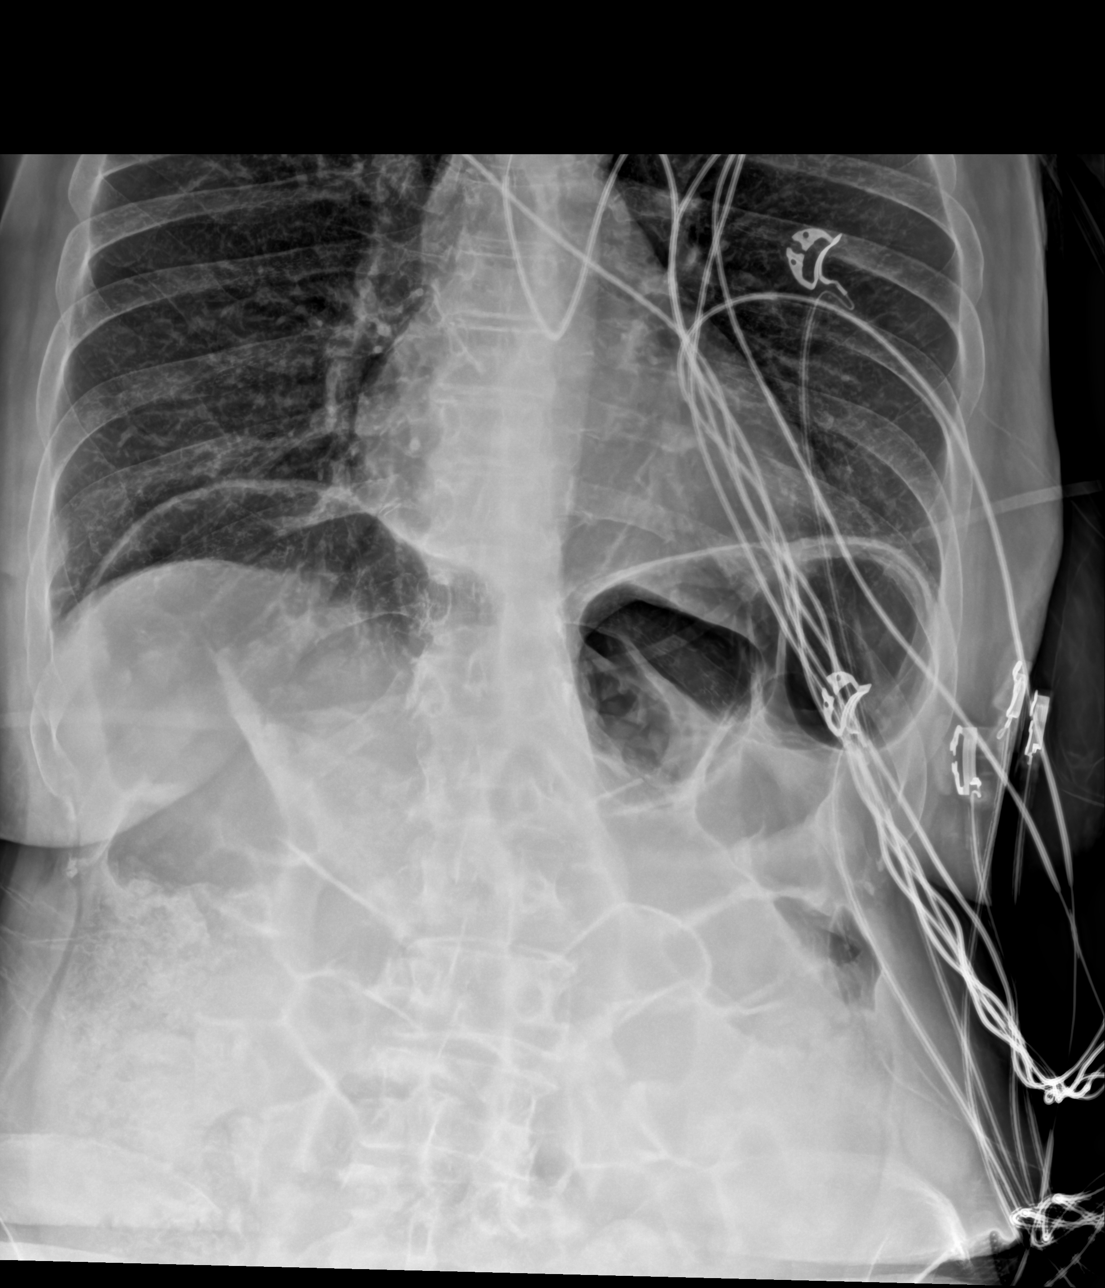
[im 2/2]
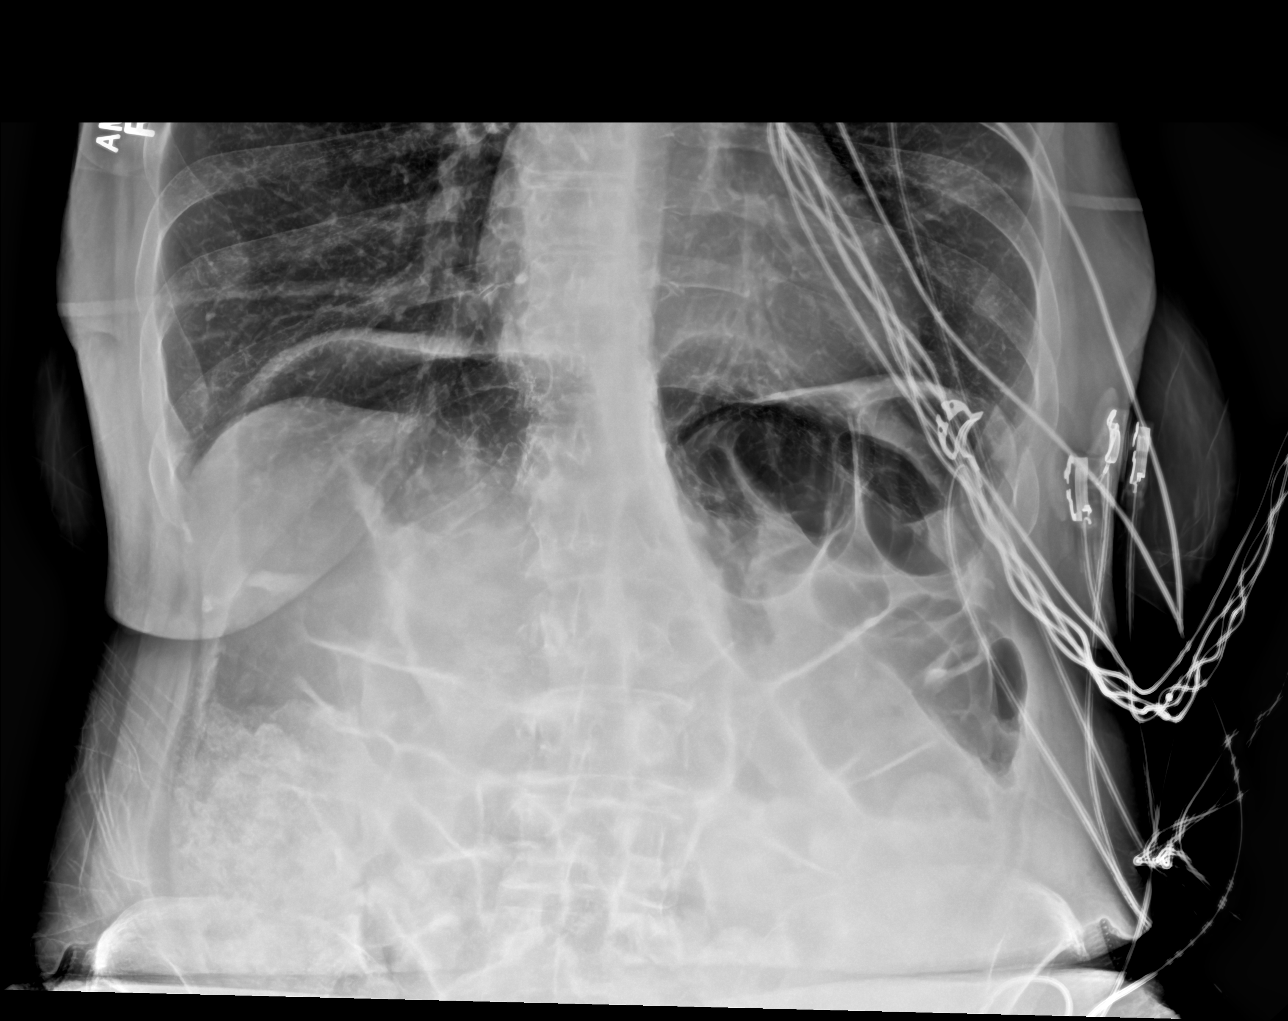

[abdomen supine]
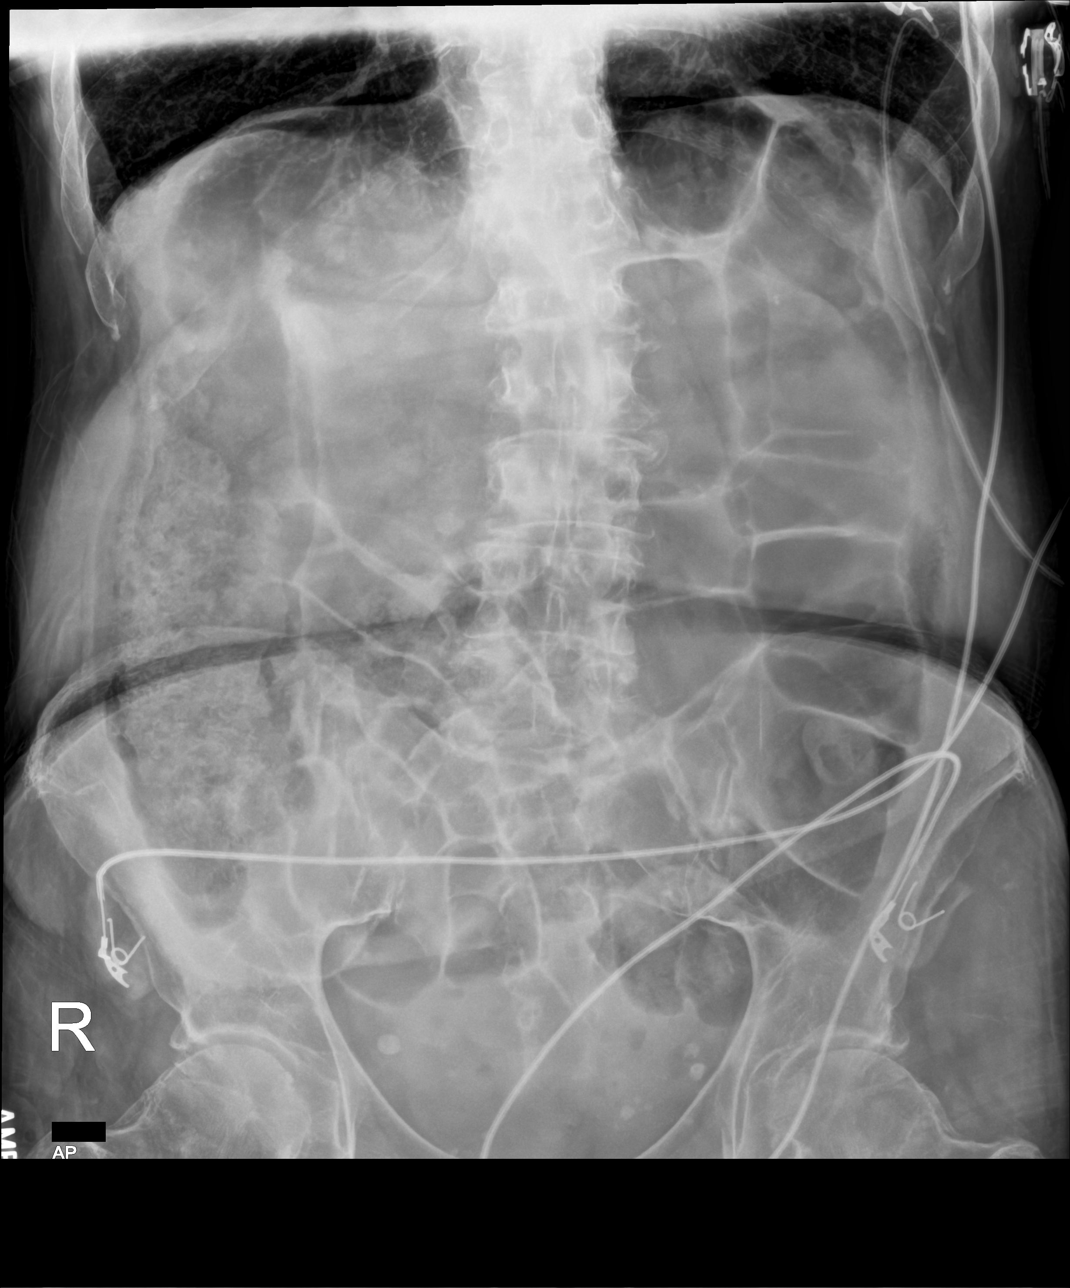

[3 of 3 positions shown; findings below may reference images not displayed]

FINDINGS: The bowel gas pattern is normal with air-filled loops of large and
small bowel seen throughout the abdomen. These extend to involve the
regions below the right and left hemidiaphragm. A large amount of
stool is seen within the ascending colon. No radio-opaque calculi or
other significant radiographic abnormality is seen. Small
phleboliths are seen within the pelvis.
IMPRESSION: 1.   Large stool burden without evidence of bowel obstruction.
2. Further evaluation of the abdomen pelvis CT is recommended if
intra-abdominal free air remains of clinical concern.

## 2022-03-21 IMAGING — DX DG CHEST 1V PORT
1 series · 1 of 1 positions shown · non-contrast
Comparison: 11/21/2013

CLINICAL DATA: Short of breath

EXAM:
PORTABLE CHEST 1 VIEW

[chest ap]
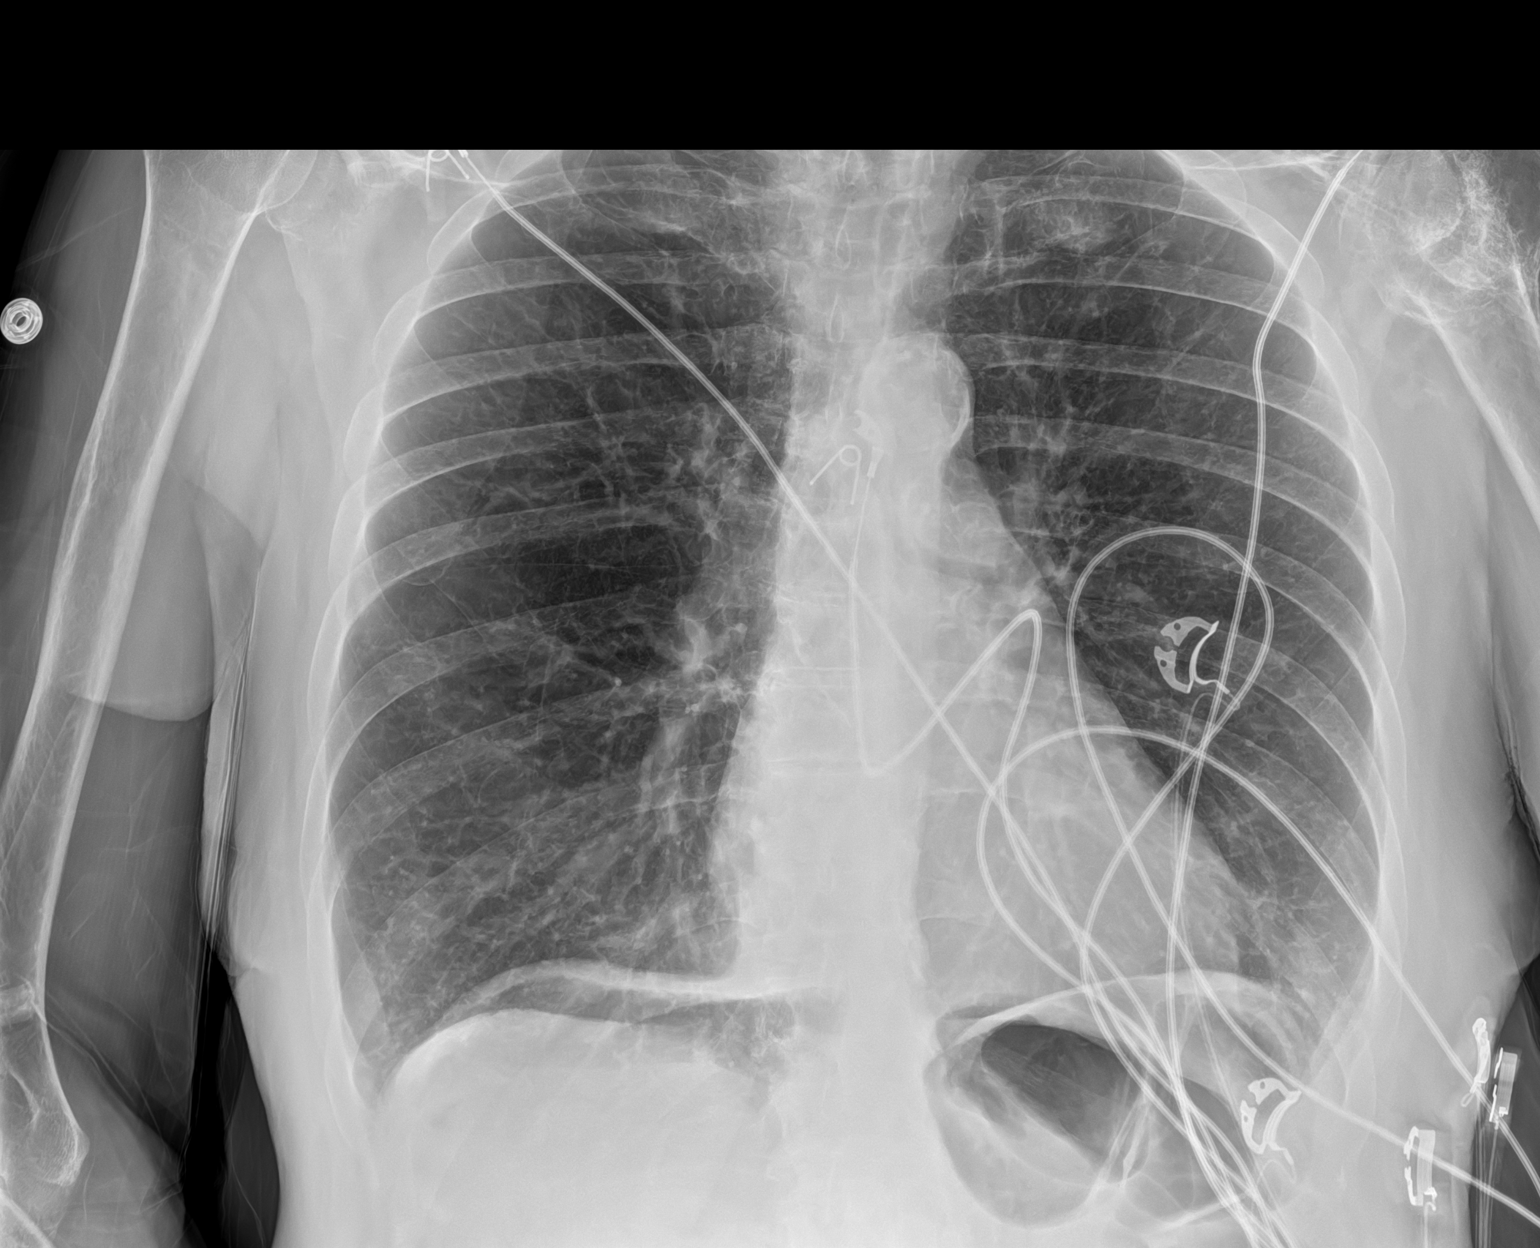

[1 of 1 positions shown; findings below may reference images not displayed]

FINDINGS: Heart size and vascularity normal. Atherosclerotic calcification
aortic arch. Prominent lung markings suggesting COPD. No acute
infiltrate or effusion.

Sclerotic irregularity left humeral head  due to healing fracture.

Question free air under the right hemidiaphragm.
IMPRESSION: No acute cardiopulmonary abnormality

Healing fracture left humerus

Possible intraperitoneal air. Recommend two view abdomen. Left
lateral decubitus view also may be helpful.

## 2022-03-21 IMAGING — US US EXTREM LOW VENOUS*L*
1 series · 13 of 24 positions shown · non-contrast
Comparison: None.

CLINICAL DATA: Left calf pain



[Series 1: us extrem low venous*left* · 0.07mm/px · 13 of 60 slices shown]
[im 1/60]
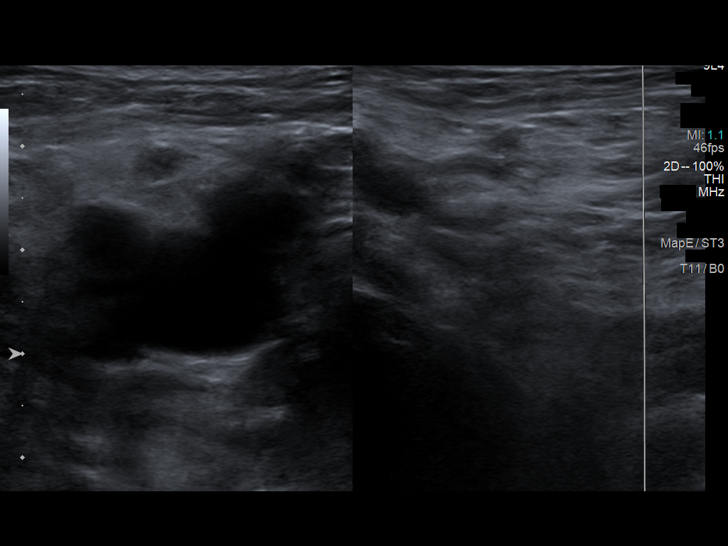
[im 6/60]
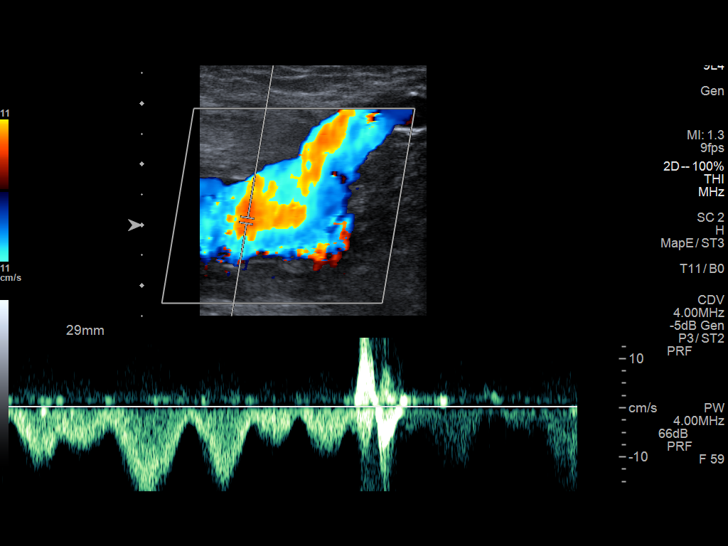
[im 11/60]
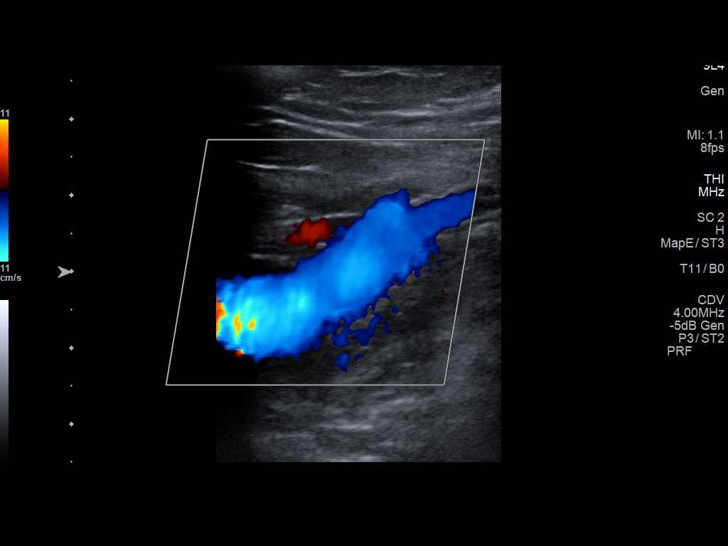
[im 16/60]
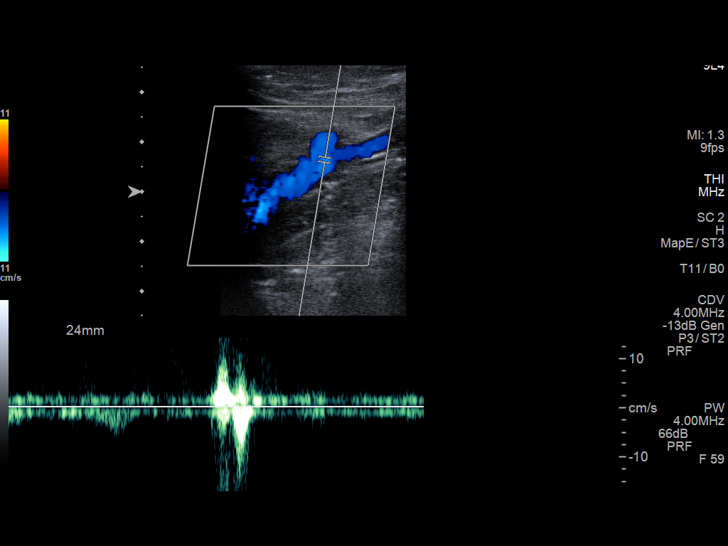
[im 21/60]
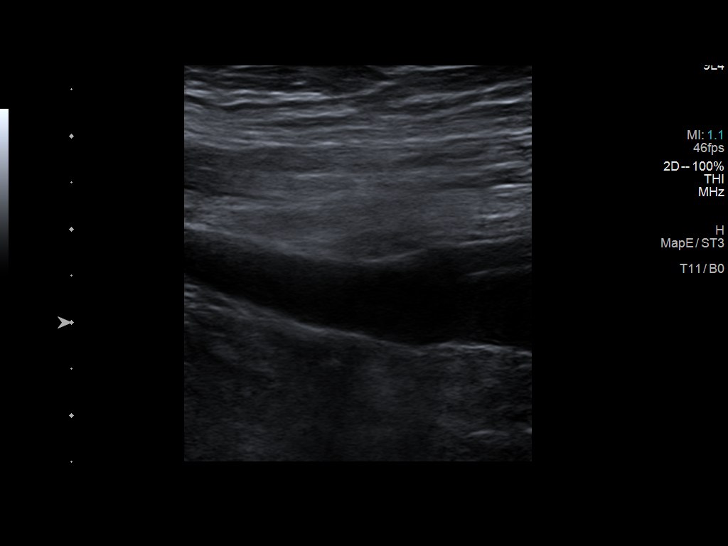
[im 26/60]
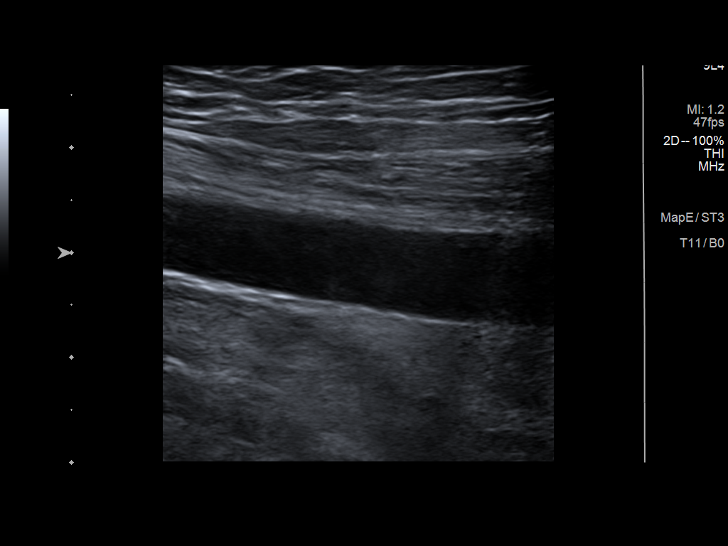
[im 31/60]
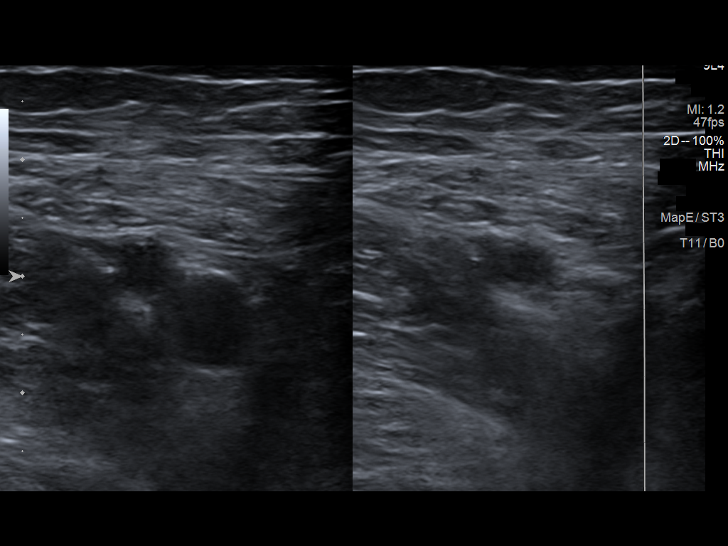
[im 34/60]
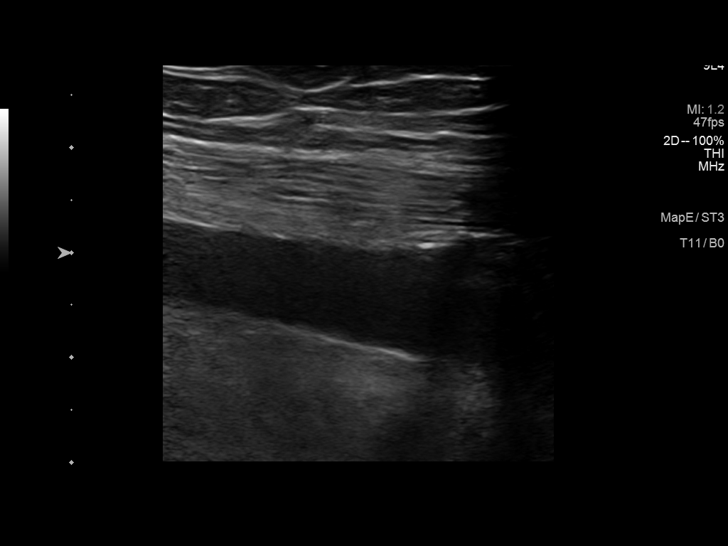
[im 39/60]
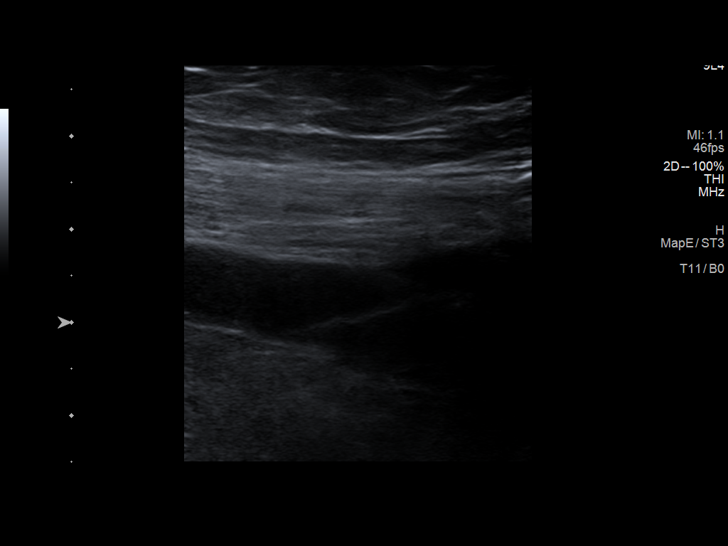
[im 44/60]
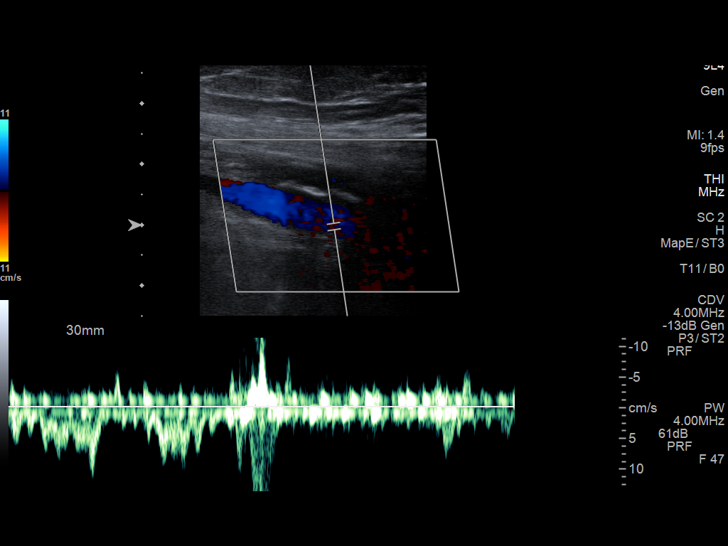
[im 49/60]
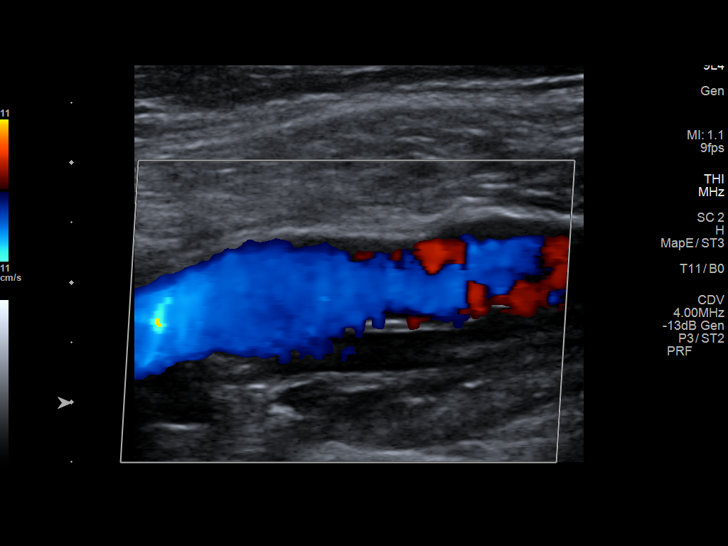
[im 54/60]
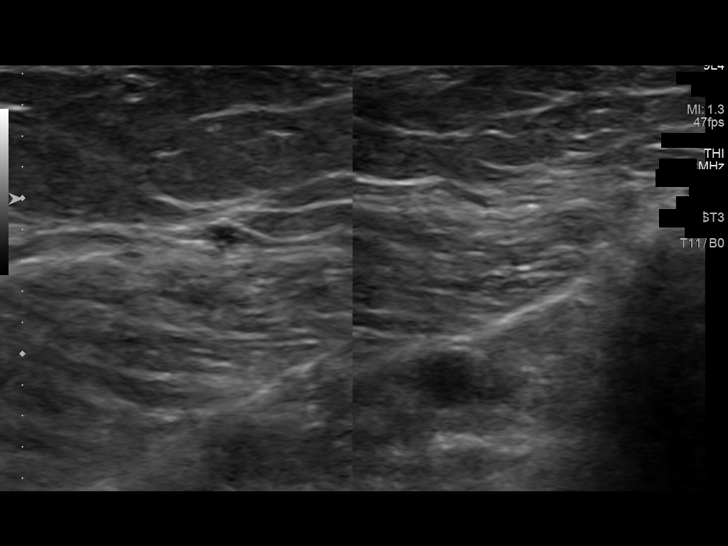
[im 60/60]
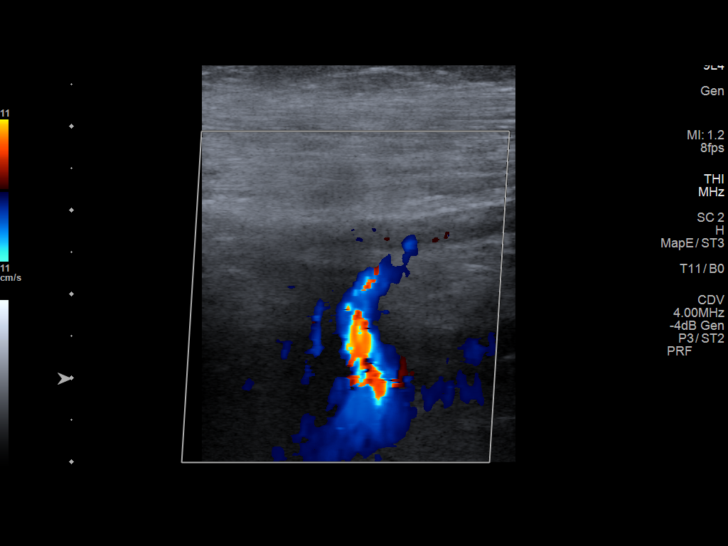

[13 of 24 positions shown; findings below may reference images not displayed]

FINDINGS: Contralateral Common Femoral Vein: Respiratory phasicity is normal
and symmetric with the symptomatic side. No evidence of thrombus.
Normal compressibility.

Common Femoral Vein: No evidence of thrombus. Normal
compressibility, respiratory phasicity and response to augmentation.

Saphenofemoral Junction: No evidence of thrombus. Normal
compressibility and flow on color Doppler imaging.

Profunda Femoral Vein: No evidence of thrombus. Normal
compressibility and flow on color Doppler imaging.

Femoral Vein: No evidence of thrombus. Normal compressibility,
respiratory phasicity and response to augmentation.

Popliteal Vein: No evidence of thrombus. Normal compressibility,
respiratory phasicity and response to augmentation.

Calf Veins: No evidence of thrombus. Normal compressibility and flow
on color Doppler imaging.

Superficial Great Saphenous Vein: No evidence of thrombus. Normal
compressibility.

Venous Reflux:  None.

Other Findings:  None.
IMPRESSION: No evidence of left lower extremity 5 deep venous thrombosis.

## 2022-05-22 ENCOUNTER — Other Ambulatory Visit (HOSPITAL_COMMUNITY): Payer: Self-pay | Admitting: Family Medicine

## 2022-05-22 ENCOUNTER — Other Ambulatory Visit: Payer: Self-pay | Admitting: Family Medicine

## 2022-05-22 DIAGNOSIS — F17210 Nicotine dependence, cigarettes, uncomplicated: Secondary | ICD-10-CM

## 2022-05-22 DIAGNOSIS — R059 Cough, unspecified: Secondary | ICD-10-CM

## 2022-10-18 ENCOUNTER — Encounter: Payer: Self-pay | Admitting: Radiology

## 2023-03-27 ENCOUNTER — Other Ambulatory Visit (HOSPITAL_COMMUNITY): Payer: Self-pay | Admitting: Family Medicine

## 2023-03-27 DIAGNOSIS — R634 Abnormal weight loss: Secondary | ICD-10-CM

## 2023-04-02 ENCOUNTER — Inpatient Hospital Stay (HOSPITAL_COMMUNITY)
Admission: EM | Admit: 2023-04-02 | Discharge: 2023-04-04 | DRG: 189 | Disposition: A | Discharge status: Home / Self Care | Payer: Medicaid Other | Attending: Family Medicine | Admitting: Family Medicine

## 2023-04-02 ENCOUNTER — Emergency Department (HOSPITAL_COMMUNITY): Payer: Medicaid Other

## 2023-04-02 ENCOUNTER — Encounter (HOSPITAL_COMMUNITY): Payer: Self-pay | Admitting: *Deleted

## 2023-04-02 ENCOUNTER — Other Ambulatory Visit: Payer: Self-pay

## 2023-04-02 DIAGNOSIS — K219 Gastro-esophageal reflux disease without esophagitis: Secondary | ICD-10-CM | POA: Diagnosis present

## 2023-04-02 DIAGNOSIS — K029 Dental caries, unspecified: Secondary | ICD-10-CM | POA: Diagnosis present

## 2023-04-02 DIAGNOSIS — J9622 Acute and chronic respiratory failure with hypercapnia: Secondary | ICD-10-CM | POA: Diagnosis present

## 2023-04-02 DIAGNOSIS — K047 Periapical abscess without sinus: Secondary | ICD-10-CM | POA: Diagnosis present

## 2023-04-02 DIAGNOSIS — Z90710 Acquired absence of both cervix and uterus: Secondary | ICD-10-CM | POA: Diagnosis not present

## 2023-04-02 DIAGNOSIS — R0902 Hypoxemia: Secondary | ICD-10-CM | POA: Diagnosis present

## 2023-04-02 DIAGNOSIS — R54 Age-related physical debility: Secondary | ICD-10-CM | POA: Diagnosis present

## 2023-04-02 DIAGNOSIS — Z66 Do not resuscitate: Secondary | ICD-10-CM | POA: Diagnosis present

## 2023-04-02 DIAGNOSIS — J441 Chronic obstructive pulmonary disease with (acute) exacerbation: Secondary | ICD-10-CM | POA: Diagnosis present

## 2023-04-02 DIAGNOSIS — F17210 Nicotine dependence, cigarettes, uncomplicated: Secondary | ICD-10-CM | POA: Diagnosis present

## 2023-04-02 DIAGNOSIS — E8729 Other acidosis: Secondary | ICD-10-CM | POA: Diagnosis present

## 2023-04-02 DIAGNOSIS — E7800 Pure hypercholesterolemia, unspecified: Secondary | ICD-10-CM | POA: Diagnosis present

## 2023-04-02 DIAGNOSIS — R599 Enlarged lymph nodes, unspecified: Secondary | ICD-10-CM | POA: Diagnosis present

## 2023-04-02 DIAGNOSIS — I10 Essential (primary) hypertension: Secondary | ICD-10-CM | POA: Diagnosis present

## 2023-04-02 DIAGNOSIS — J439 Emphysema, unspecified: Secondary | ICD-10-CM | POA: Diagnosis present

## 2023-04-02 DIAGNOSIS — Z7982 Long term (current) use of aspirin: Secondary | ICD-10-CM

## 2023-04-02 DIAGNOSIS — F419 Anxiety disorder, unspecified: Secondary | ICD-10-CM | POA: Diagnosis present

## 2023-04-02 DIAGNOSIS — R918 Other nonspecific abnormal finding of lung field: Secondary | ICD-10-CM

## 2023-04-02 DIAGNOSIS — J9621 Acute and chronic respiratory failure with hypoxia: Principal | ICD-10-CM | POA: Diagnosis present

## 2023-04-02 DIAGNOSIS — K0510 Chronic gingivitis, plaque induced: Secondary | ICD-10-CM | POA: Diagnosis present

## 2023-04-02 DIAGNOSIS — Z7951 Long term (current) use of inhaled steroids: Secondary | ICD-10-CM | POA: Diagnosis not present

## 2023-04-02 DIAGNOSIS — R64 Cachexia: Secondary | ICD-10-CM | POA: Diagnosis present

## 2023-04-02 DIAGNOSIS — C3490 Malignant neoplasm of unspecified part of unspecified bronchus or lung: Secondary | ICD-10-CM | POA: Diagnosis present

## 2023-04-02 DIAGNOSIS — Z885 Allergy status to narcotic agent status: Secondary | ICD-10-CM | POA: Diagnosis not present

## 2023-04-02 DIAGNOSIS — Z79899 Other long term (current) drug therapy: Secondary | ICD-10-CM

## 2023-04-02 DIAGNOSIS — J9601 Acute respiratory failure with hypoxia: Secondary | ICD-10-CM | POA: Diagnosis not present

## 2023-04-02 LAB — COMPREHENSIVE METABOLIC PANEL
ALT: 21 U/L (ref 0–44)
AST: 23 U/L (ref 15–41)
Albumin: 4 g/dL (ref 3.5–5.0)
Alkaline Phosphatase: 68 U/L (ref 38–126)
Anion gap: 16 — ABNORMAL HIGH (ref 5–15)
BUN: 17 mg/dL (ref 8–23)
CO2: 28 mmol/L (ref 22–32)
Calcium: 10.3 mg/dL (ref 8.9–10.3)
Chloride: 90 mmol/L — ABNORMAL LOW (ref 98–111)
Creatinine, Ser: 0.9 mg/dL (ref 0.44–1.00)
GFR, Estimated: >60 mL/min (ref >60)
Glucose, Bld: 94 mg/dL (ref 70–99)
Potassium: 3.2 mmol/L — ABNORMAL LOW (ref 3.5–5.1)
Sodium: 134 mmol/L — ABNORMAL LOW (ref 135–145)
Total Bilirubin: 0.8 mg/dL (ref 0.3–1.2)
Total Protein: 7.1 g/dL (ref 6.5–8.1)

## 2023-04-02 LAB — BLOOD GAS, ARTERIAL
Acid-Base Excess: 5.5 mmol/L — ABNORMAL HIGH (ref 0.0–2.0)
Bicarbonate: 31.1 mmol/L — ABNORMAL HIGH (ref 20.0–28.0)
Drawn by: 22223
O2 Saturation: 94.9 %
Patient temperature: 37
pCO2 arterial: 48 mmHg (ref 32–48)
pH, Arterial: 7.42 (ref 7.35–7.45)
pO2, Arterial: 66 mmHg — ABNORMAL LOW (ref 83–108)

## 2023-04-02 LAB — CBC WITH DIFFERENTIAL/PLATELET
Abs Immature Granulocytes: 0.11 10*3/uL — ABNORMAL HIGH (ref 0.00–0.07)
Basophils Absolute: 0.1 10*3/uL (ref 0.0–0.1)
Basophils Relative: 0 %
Eosinophils Absolute: 0 10*3/uL (ref 0.0–0.5)
Eosinophils Relative: 0 %
HCT: 45.4 % (ref 36.0–46.0)
Hemoglobin: 15.5 g/dL — ABNORMAL HIGH (ref 12.0–15.0)
Immature Granulocytes: 1 %
Lymphocytes Relative: 8 %
Lymphs Abs: 1.2 10*3/uL (ref 0.7–4.0)
MCH: 33.3 pg (ref 26.0–34.0)
MCHC: 34.1 g/dL (ref 30.0–36.0)
MCV: 97.4 fL (ref 80.0–100.0)
Monocytes Absolute: 1.3 10*3/uL — ABNORMAL HIGH (ref 0.1–1.0)
Monocytes Relative: 9 %
Neutro Abs: 11.7 10*3/uL — ABNORMAL HIGH (ref 1.7–7.7)
Neutrophils Relative %: 82 %
Platelets: 294 10*3/uL (ref 150–400)
RBC: 4.66 MIL/uL (ref 3.87–5.11)
RDW: 16.8 % — ABNORMAL HIGH (ref 11.5–15.5)
WBC: 14.4 10*3/uL — ABNORMAL HIGH (ref 4.0–10.5)
nRBC: 0 % (ref 0.0–0.2)

## 2023-04-02 LAB — URINALYSIS, ROUTINE W REFLEX MICROSCOPIC
Bilirubin Urine: NEGATIVE
Glucose, UA: NEGATIVE mg/dL
Hgb urine dipstick: NEGATIVE
Ketones, ur: 5 mg/dL — AB
Leukocytes,Ua: NEGATIVE
Nitrite: NEGATIVE
Protein, ur: NEGATIVE mg/dL
Specific Gravity, Urine: 1.013 (ref 1.005–1.030)
pH: 6 (ref 5.0–8.0)

## 2023-04-02 LAB — TROPONIN I (HIGH SENSITIVITY)
Troponin I (High Sensitivity): 24 ng/L — ABNORMAL HIGH (ref <18)
Troponin I (High Sensitivity): 20 ng/L — ABNORMAL HIGH (ref <18)

## 2023-04-02 LAB — LACTIC ACID, PLASMA
Lactic Acid, Venous: 1.3 mmol/L (ref 0.5–1.9)
Lactic Acid, Venous: 1 mmol/L (ref 0.5–1.9)

## 2023-04-02 MED ORDER — PREDNISONE 20 MG PO TABS
50.0000 mg | ORAL_TABLET | Freq: Every day | ORAL | Status: DC
  Administered 2023-04-03 – 2023-04-04 (×2): 50 mg via ORAL
  Filled 2023-04-02: qty 1
  Filled 2023-04-02: qty 3

## 2023-04-02 MED ORDER — ONDANSETRON HCL 4 MG PO TABS: 4.0000 mg | ORAL_TABLET | Freq: Four times a day (QID) | ORAL | Status: DC | PRN

## 2023-04-02 MED ORDER — IPRATROPIUM-ALBUTEROL 0.5-2.5 (3) MG/3ML IN SOLN
3.0000 mL | Freq: Once | RESPIRATORY_TRACT | Status: AC
  Administered 2023-04-02: 3 mL via RESPIRATORY_TRACT
  Filled 2023-04-02: qty 3

## 2023-04-02 MED ORDER — POTASSIUM CHLORIDE 10 MEQ/100ML IV SOLN
10.0000 meq | INTRAVENOUS | Status: AC
  Administered 2023-04-02 – 2023-04-03 (×3): 10 meq via INTRAVENOUS
  Filled 2023-04-02 (×3): qty 100

## 2023-04-02 MED ORDER — ASPIRIN 81 MG PO TBEC
81.0000 mg | DELAYED_RELEASE_TABLET | Freq: Every day | ORAL | Status: DC
  Administered 2023-04-02 – 2023-04-04 (×3): 81 mg via ORAL
  Filled 2023-04-02 (×3): qty 1

## 2023-04-02 MED ORDER — DEXTROSE IN LACTATED RINGERS 5 % IV SOLN: INTRAVENOUS | Status: DC

## 2023-04-02 MED ORDER — ACETAMINOPHEN 650 MG RE SUPP: 650.0000 mg | Freq: Four times a day (QID) | RECTAL | Status: DC | PRN

## 2023-04-02 MED ORDER — HEPARIN SODIUM (PORCINE) 5000 UNIT/ML IJ SOLN
5000.0000 [IU] | Freq: Three times a day (TID) | INTRAMUSCULAR | Status: DC
  Administered 2023-04-02 – 2023-04-04 (×5): 5000 [IU] via SUBCUTANEOUS
  Filled 2023-04-02 (×5): qty 1

## 2023-04-02 MED ORDER — ALBUTEROL SULFATE (2.5 MG/3ML) 0.083% IN NEBU: 2.5000 mg | INHALATION_SOLUTION | RESPIRATORY_TRACT | Status: DC | PRN

## 2023-04-02 MED ORDER — POTASSIUM CHLORIDE 20 MEQ PO PACK
40.0000 meq | PACK | Freq: Once | ORAL | Status: AC
  Administered 2023-04-02: 40 meq via ORAL
  Filled 2023-04-02: qty 2

## 2023-04-02 MED ORDER — VERAPAMIL HCL 80 MG PO TABS
120.0000 mg | ORAL_TABLET | Freq: Two times a day (BID) | ORAL | Status: DC
  Filled 2023-04-02 (×6): qty 1

## 2023-04-02 MED ORDER — ONDANSETRON HCL 4 MG/2ML IJ SOLN: 4.0000 mg | Freq: Four times a day (QID) | INTRAMUSCULAR | Status: DC | PRN

## 2023-04-02 MED ORDER — LACTATED RINGERS IV BOLUS
500.0000 mL | Freq: Once | INTRAVENOUS | Status: AC
  Administered 2023-04-02: 500 mL via INTRAVENOUS

## 2023-04-02 MED ORDER — IOHEXOL 350 MG/ML SOLN
75.0000 mL | Freq: Once | INTRAVENOUS | Status: AC | PRN
  Administered 2023-04-02: 75 mL via INTRAVENOUS

## 2023-04-02 MED ORDER — PAROXETINE HCL 20 MG PO TABS
20.0000 mg | ORAL_TABLET | Freq: Every morning | ORAL | Status: DC
  Administered 2023-04-03 – 2023-04-04 (×2): 20 mg via ORAL
  Filled 2023-04-02 (×2): qty 1

## 2023-04-02 MED ORDER — ALBUTEROL SULFATE (2.5 MG/3ML) 0.083% IN NEBU
2.5000 mg | INHALATION_SOLUTION | Freq: Once | RESPIRATORY_TRACT | Status: AC
  Administered 2023-04-02: 2.5 mg via RESPIRATORY_TRACT
  Filled 2023-04-02: qty 3

## 2023-04-02 MED ORDER — METHYLPREDNISOLONE SODIUM SUCC 125 MG IJ SOLR
125.0000 mg | Freq: Once | INTRAMUSCULAR | Status: AC
  Administered 2023-04-02: 125 mg via INTRAVENOUS
  Filled 2023-04-02: qty 2

## 2023-04-02 MED ORDER — SORBITOL 70 % SOLN: 30.0000 mL | Freq: Every day | Status: DC | PRN

## 2023-04-02 MED ORDER — PANTOPRAZOLE SODIUM 40 MG PO TBEC
40.0000 mg | DELAYED_RELEASE_TABLET | Freq: Every day | ORAL | Status: DC
  Administered 2023-04-02 – 2023-04-04 (×3): 40 mg via ORAL
  Filled 2023-04-02 (×3): qty 1

## 2023-04-02 MED ORDER — DOCUSATE SODIUM 100 MG PO CAPS
100.0000 mg | ORAL_CAPSULE | Freq: Two times a day (BID) | ORAL | Status: DC
  Administered 2023-04-02 – 2023-04-04 (×4): 100 mg via ORAL
  Filled 2023-04-02 (×4): qty 1

## 2023-04-02 MED ORDER — ACETAMINOPHEN 325 MG PO TABS
650.0000 mg | ORAL_TABLET | Freq: Four times a day (QID) | ORAL | Status: DC | PRN
  Administered 2023-04-03: 650 mg via ORAL
  Filled 2023-04-02: qty 2

## 2023-04-02 MED ORDER — IPRATROPIUM-ALBUTEROL 0.5-2.5 (3) MG/3ML IN SOLN
3.0000 mL | Freq: Four times a day (QID) | RESPIRATORY_TRACT | Status: DC
  Administered 2023-04-02 – 2023-04-03 (×2): 3 mL via RESPIRATORY_TRACT
  Filled 2023-04-02 (×2): qty 3

## 2023-04-02 NOTE — ED Notes (Addendum)
Pt not on oxygen at baseline. Pt currently on 5 L via  with an SpO2 of 91%. MD at bedside at this time

## 2023-04-02 NOTE — ED Notes (Signed)
Patient transported to CT 

## 2023-04-02 NOTE — ED Triage Notes (Signed)
Pt with swelling to right face since May 13th and knot to right side of head, pt denies any injury to head.  Pt states she has seen a dentist for the swelling. Pt noted with RA sat at 80-81%, pt denies using O2 at home.   [place] applied at 2 L/M and sats increased to 84 %.  O2 increased to 3 L/M.   C/o CP for a month per pt., + productive cough.

## 2023-04-02 NOTE — ED Provider Notes (Addendum)
[place] EMERGENCY DEPARTMENT AT [facility] Provider Note   CSN: [id number] Arrival date & time: 04/02/23  1433     History  Chief Complaint  Patient presents with   hypoxia    Kathleen Bennett is a 62 y.o. female.  Patient came by POV.  Patient states that she is having trouble with dental problems followed by oral surgery has been ongoing since May.  She does have some swelling to the left lower part of her jaw.  Oral surgery told her she had lymph nodes involved.  Going through her medications does not appear that she is on any antibiotics.  She also states that she has had a productive cough for a month.  Has felt short of breath for several months.  Patient does not use oxygen at home.  Patient arrived here with oxygen saturation of 81%.  Patient does have a history of COPD.  Hypertension gastroesophageal reflux disease high cholesterol diverticulitis.  Patient's had an abdominal hysterectomy.  Patient still an everyday smoker.  Patient denies any fevers.  Temp here 98.1.  Blood pressure 110/70 heart rate 101 respirations 24.  Patient now on 3 L of oxygen and oxygen saturation is in the 90s.       Home Medications Prior to Admission medications   Medication Sig Start Date End Date Taking? Authorizing Provider  acetaminophen (TYLENOL) 500 MG tablet Take 500-1,000 mg by mouth every 6 (six) hours as needed (for pain.).    [provider]  albuterol (PROVENTIL HFA;VENTOLIN HFA) 108 (90 BASE) MCG/ACT inhaler Inhale 2 puffs into the lungs every 6 (six) hours as needed for shortness of breath or wheezing.    [provider]  albuterol (PROVENTIL) (2.5 MG/3ML) 0.083% nebulizer solution Take 2.5 mg by nebulization every 6 (six) hours as needed for wheezing or shortness of breath.    [provider]  Aspirin-Acetaminophen-Caffeine (GOODYS EXTRA STRENGTH) [phone] MG PACK Take 1 packet by mouth 2 (two) times daily as needed (pain.).    [provider]  Cholecalciferol (VITAMIN D-3) 125 MCG (5000 UT) TABS Take 5,000 Units by mouth in the morning.    [provider]  lisinopril-hydrochlorothiazide (PRINZIDE,ZESTORETIC) 20-12.5 MG per tablet Take 1 tablet by mouth in the morning.    [provider]  lovastatin (MEVACOR) 20 MG tablet Take 20 mg by mouth every morning.    [provider]  meloxicam (MOBIC) 15 MG tablet Take 15 mg by mouth in the morning. 10/02/21   [provider]  Misc Natural Products (IMMUNE ESSENTIALS PO) Take 1 tablet by mouth daily. Nature's Bounty Immune 24 Hour + ( 1000mg  Ester-C+ Vitamin D & Zinc, with Elderberry & Echinacea)    [provider]  Multiple Vitamins-Minerals (WOMENS MULTIVITAMIN PO) Take 1 tablet by mouth in the morning.    [provider]  omeprazole (PRILOSEC) 20 MG capsule Take 20 mg by mouth daily before breakfast.    [provider]  oxyCODONE (OXY IR/ROXICODONE) 5 MG immediate release tablet Take 1 tablet (5 mg total) by mouth every 4 (four) hours as needed for moderate pain. Patient taking differently: Take 5 mg by mouth 3 (three) times daily as needed for moderate pain. 11/23/13   [name], MD  PARoxetine (PAXIL) 20 MG tablet Take 20 mg by mouth in the morning.    [provider]  simethicone (MYLICON) 125 MG chewable tablet Chew 125 mg by mouth every 6 (six) hours as needed for flatulence.  [provider]  TRELEGY ELLIPTA 100-62.5-25 MCG/INH AEPB Inhale 1 puff into the lungs in the morning. 10/31/20   [provider]  verapamil (CALAN) 120 MG tablet Take 240 mg by mouth in the morning.    [provider]  vitamin B-12 (CYANOCOBALAMIN) 1000 MCG tablet Take 1,000 mcg by mouth in the morning.    [provider]      Allergies    Codeine    Review of Systems   Review of Systems  Constitutional:  Negative for chills and fever.  HENT:  Positive for dental problem and facial  swelling. Negative for ear pain and sore throat.   Eyes:  Negative for pain and visual disturbance.  Respiratory:  Positive for shortness of breath and wheezing. Negative for cough.   Cardiovascular:  Negative for chest pain and palpitations.  Gastrointestinal:  Negative for abdominal pain and vomiting.  Genitourinary:  Negative for dysuria and hematuria.  Musculoskeletal:  Negative for arthralgias and back pain.  Skin:  Negative for color change and rash.  Neurological:  Negative for seizures and syncope.  All other systems reviewed and are negative.   Physical Exam Updated Vital Signs BP (!) 131/95   Pulse 89   Temp 98.2 F (36.8 C) (Oral)   Resp (!) 30   Ht 1.6 m (5\' 3" )   Wt 53.5 kg   SpO2 91%   BMI 20.90 kg/m  Physical Exam Vitals and nursing note reviewed.  Constitutional:      General: She is not in acute distress.    Appearance: Normal appearance. She is well-developed.  HENT:     Head: Normocephalic and atraumatic.     Mouth/Throat:     Mouth: Mucous membranes are moist.     Comments: Very poor dentition.  There is evidence of some gingivitis predominantly on the right lower area.  Some of the teeth appear loose in that area.  Slight swelling to the jaw area.  No erythema.  Airway is intact no stridor.  Patient able to talk fine no muffled voice. Eyes:     Extraocular Movements: Extraocular movements intact.     Conjunctiva/sclera: Conjunctivae normal.     Pupils: Pupils are equal, round, and reactive to light.  Cardiovascular:     Rate and Rhythm: Normal rate and regular rhythm.     Heart sounds: No murmur heard. Pulmonary:     Effort: Pulmonary effort is normal. No respiratory distress.     Breath sounds: Wheezing present.  Abdominal:     Palpations: Abdomen is soft.     Tenderness: There is no abdominal tenderness.  Musculoskeletal:        General: No swelling.     Cervical back: Neck supple.     Right lower leg: No edema.     Left lower leg: No edema.   Lymphadenopathy:     Cervical: Cervical adenopathy present.  Skin:    General: Skin is warm and dry.     Capillary Refill: Capillary refill takes less than 2 seconds.  Neurological:     General: No focal deficit present.     Mental Status: She is alert and oriented to person, place, and time.  Psychiatric:        Mood and Affect: Mood normal.     ED Results / Procedures / Treatments   Labs (all labs ordered are listed, but only abnormal results are displayed) Labs Reviewed  LACTIC ACID, PLASMA  LACTIC ACID, PLASMA  COMPREHENSIVE METABOLIC  PANEL  CBC WITH DIFFERENTIAL/PLATELET  URINALYSIS, ROUTINE W REFLEX MICROSCOPIC    EKG None  Radiology No results found.  Procedures Procedures    Medications Ordered in ED Medications  albuterol (PROVENTIL) (2.5 MG/3ML) 0.083% nebulizer solution 2.5 mg (has no administration in time range)    ED Course/ Medical Decision Making/ A&P                                 Medical Decision Making Amount and/or Complexity of Data Reviewed Labs: ordered. Radiology: ordered.  Risk Prescription drug management. Decision regarding hospitalization.   Patient with hypoxia.  Certainly requiring oxygen.  No evidence of any significant upper airway obstruction.  Patient giving history is highly suggestive of productive cough could be exacerbation of COPD also could be pneumonia.  But not febrile here.  Patient does not have oxygen at home.  Therefore will most likely require admission just for the hypoxia.  Patient has bilateral wheezing.  Will treat with albuterol inhaler.  Chest x-ray pending labs pending blood cultures pending  CRITICAL CARE Performed by: Vanetta Mulders Total critical care time: 40 minutes Critical care time was exclusive of separately billable procedures and treating other patients. Critical care was necessary to treat or prevent imminent or life-threatening deterioration. Critical care was time spent personally by  me on the following activities: development of treatment plan with patient and/or surrogate as well as nursing, discussions with consultants, evaluation of patient's response to treatment, examination of patient, obtaining history from patient or surrogate, ordering and performing treatments and interventions, ordering and review of laboratory studies, ordering and review of radiographic studies, pulse oximetry and re-evaluation of patient's condition.  Patient's wheezing resolved with the albuterol nebulizer treatment.  But still has an oxygen requirement on 4 L of oxygen only satting about 94%.  Chest x-ray without evidence of pneumonia.  Suspect the productive cough is kind of bronchitis component.  Suspect the patient probably has a new requirement for home oxygen.  Will contact hospitalist for admission.  As per request of admitting hospitalist have ordered CT angio to rule out pulmonary embolus.  Patient's initial troponin was 20 4 repeat troponin is 20.   Final Clinical Impression(s) / ED Diagnoses Final diagnoses:  Hypoxia  Chronic obstructive pulmonary disease with acute exacerbation [facility])    Rx / DC Orders ED Discharge Orders     None         Vanetta Mulders, MD 04/02/23 1530    Vanetta Mulders, MD 04/02/23 1754    Vanetta Mulders, MD 04/02/23 [id number]

## 2023-04-02 NOTE — H&P (Signed)
History and Physical    Patient: Kathleen Bennett QQV:[id number] DOB: 02-09-1961 DOA: 04/02/2023 DOS: the patient was seen and examined on 04/02/2023 PCP: Hyler, Janine Limbo, NP  Patient coming from: Home  Chief Complaint:  Chief Complaint  Patient presents with   hypoxia   HPI: Kathleen Bennett is a 62 y.o. female with medical history significant for hypertension, COPD, and many dental issues who presented to the hospital because of mouth pain.  She has seen her dentist and an Transport planner and has been having ongoing work but she is not getting any better.  She says she cannot eat or drink anything because of the pain in her mouth.  While in the emergency department it was noted that her O2 sats on room air were 81%.  On 3 L O2 nasal cannula her O2 sats were 91%.  She did have mild expiratory wheezing.  Because her hypoxia was much greater than the level of her wheezing the patient did have a CT scan of her chest which shows multiple nodules and a suspicious for lung cancer.  The patient will be admitted for's hypoxia and further workup.  Review of Systems: As mentioned in the history of present illness. All other systems reviewed and are negative. Past Medical History:  Diagnosis Date   Anxiety    COPD (chronic obstructive pulmonary disease) (HCC)    DDD (degenerative disc disease), lumbar    Diverticulitis    GERD (gastroesophageal reflux disease)    Heart murmur    High cholesterol    Hypertension    PONV (postoperative nausea and vomiting)    Past Surgical History:  Procedure Laterality Date   ABDOMINAL HYSTERECTOMY     BIOPSY  10/12/2021   Procedure: BIOPSY;  Surgeon: Lanelle Bal, DO;  Location: [facility];  Service: Endoscopy;;   COLONOSCOPY WITH PROPOFOL N/A 10/12/2021   Procedure: COLONOSCOPY WITH PROPOFOL;  Surgeon: Lanelle Bal, DO;  Location: [facility]O SUITE;  Service: Endoscopy;  Laterality: N/A;  1:30 / ASA 3   Social History:  reports that she has been smoking  cigarettes. She has a 60 pack-year smoking history. She has never used smokeless tobacco. She reports current alcohol use. She reports that she does not use drugs.  Allergies  Allergen Reactions   Codeine Itching, Other (See Comments) and Rash    REACTION: Severe itching, feels like skin is crawling    History reviewed. No pertinent family history.  Prior to Admission medications   Medication Sig Start Date End Date Taking? Authorizing Provider  albuterol (PROVENTIL HFA;VENTOLIN HFA) 108 (90 BASE) MCG/ACT inhaler Inhale 2 puffs into the lungs every 6 (six) hours as needed for shortness of breath or wheezing.   Yes [provider]  albuterol (PROVENTIL) (2.5 MG/3ML) 0.083% nebulizer solution Take 2.5 mg by nebulization every 6 (six) hours as needed for wheezing or shortness of breath.   Yes [provider]  aspirin EC 81 MG tablet Take 81 mg by mouth daily. Swallow whole.   Yes [provider]  Aspirin-Acetaminophen-Caffeine (GOODYS EXTRA STRENGTH) [phone] MG PACK Take 1 packet by mouth 2 (two) times daily as needed (pain.).   Yes [provider]  baclofen (LIORESAL) 10 MG tablet Take 10 mg by mouth every 8 (eight) hours as needed. 03/26/23  Yes [provider]  Cholecalciferol (VITAMIN D-3) 125 MCG (5000 UT) TABS Take 5,000 Units by mouth in the morning.   Yes [provider]  furosemide (LASIX) 40  MG tablet Take 20 mg by mouth daily as needed for fluid or edema. 03/18/23  Yes [provider]  lovastatin (MEVACOR) 20 MG tablet Take 20 mg by mouth every morning.   Yes [provider]  omeprazole (PRILOSEC) 20 MG capsule Take 20 mg by mouth daily before breakfast.   Yes [provider]  PARoxetine (PAXIL) 20 MG tablet Take 20 mg by mouth in the morning.   Yes [provider]  potassium chloride (KLOR-CON) 10 MEQ tablet Take 10 mEq by mouth every morning. 03/22/23  Yes [provider]  simethicone  (MYLICON) 125 MG chewable tablet Chew 125 mg by mouth every 6 (six) hours as needed for flatulence.   Yes [provider]  SYMBICORT 80-4.5 MCG/ACT inhaler Inhale 2 puffs into the lungs in the morning and at bedtime. 05/16/15  Yes [provider]  verapamil (CALAN) 120 MG tablet Take 240 mg by mouth 2 (two) times daily.   Yes [provider]  vitamin B-12 (CYANOCOBALAMIN) 1000 MCG tablet Take 1,000 mcg by mouth in the morning.   Yes [provider]    Physical Exam: Vitals:   04/02/23 1745 04/02/23 1843 04/02/23 1853 04/02/23 1915  BP: (!) 151/83   (!) 137/96  Pulse: 75  76 83  Resp: 19  (!) 25 (!) 25  Temp:  98.4 F (36.9 C)    TempSrc:  Oral    SpO2: 94%  93% 93%  Weight:      Height:       Physical Exam:  General: chronically ill appearing, malnourished HEENT: Normocephalic, atraumatic, Pupils pinpoint Moth is dry and tongue has whitish plaques.  Very poor dentition.  Many teeth missing.  Cardiovascular: Normal rate and rhythm. Distal pulses intact. Pulmonary: diminished bs, faint exp wheeze,  Gastrointestinal: Nondistended abdomen, soft, non-tender, normoactive bowel sounds Musculoskeletal:Severe kyphosis, no lower ext edema Lymphadenopathy: No cervical LAD. Skin: Skin is warm and dry. Neuro: No focal deficits noted, AAOx3 at times and seems disoriented at times [facility]:  cooperative  Data Reviewed:  Results for orders placed or performed during the hospital encounter of 04/02/23 (from the past 24 hour(s))  Troponin I (High Sensitivity)     Status: Abnormal   Collection Time: 04/02/23  3:30 PM  Result Value Ref Range   Troponin I (High Sensitivity) 24 (H) <18 ng/L  Lactic acid, plasma     Status: None   Collection Time: 04/02/23  3:31 PM  Result Value Ref Range   Lactic Acid, Venous 1.3 0.5 - 1.9 mmol/L  Comprehensive metabolic panel     Status: Abnormal   Collection Time: 04/02/23  3:31 PM  Result Value Ref Range   Sodium 134 (L)  135 - 145 mmol/L   Potassium 3.2 (L) 3.5 - 5.1 mmol/L   Chloride 90 (L) 98 - 111 mmol/L   CO2 28 22 - 32 mmol/L   Glucose, Bld 94 70 - 99 mg/dL   BUN 17 8 - 23 mg/dL   Creatinine, Ser 2.13 0.44 - 1.00 mg/dL   Calcium 08.6 8.9 - 57.8 mg/dL   Total Protein 7.1 6.5 - 8.1 g/dL   Albumin 4.0 3.5 - 5.0 g/dL   AST 23 15 - 41 U/L   ALT 21 0 - 44 U/L   Alkaline Phosphatase 68 38 - 126 U/L   Total Bilirubin 0.8 0.3 - 1.2 mg/dL   GFR, Estimated >46 >96 mL/min   Anion gap 16 (H) 5 - 15  CBC with  Differential     Status: Abnormal   Collection Time: 04/02/23  3:31 PM  Result Value Ref Range   WBC 14.4 (H) 4.0 - 10.5 K/uL   RBC 4.66 3.87 - 5.11 MIL/uL   Hemoglobin 15.5 (H) 12.0 - 15.0 g/dL   HCT 56.2 13.0 - 86.5 %   MCV 97.4 80.0 - 100.0 fL   MCH 33.3 26.0 - 34.0 pg   MCHC 34.1 30.0 - 36.0 g/dL   RDW 78.4 (H) 69.6 - 29.5 %   Platelets 294 150 - 400 K/uL   nRBC 0.0 0.0 - 0.2 %   Neutrophils Relative % 82 %   Neutro Abs 11.7 (H) 1.7 - 7.7 K/uL   Lymphocytes Relative 8 %   Lymphs Abs 1.2 0.7 - 4.0 K/uL   Monocytes Relative 9 %   Monocytes Absolute 1.3 (H) 0.1 - 1.0 K/uL   Eosinophils Relative 0 %   Eosinophils Absolute 0.0 0.0 - 0.5 K/uL   Basophils Relative 0 %   Basophils Absolute 0.1 0.0 - 0.1 K/uL   Immature Granulocytes 1 %   Abs Immature Granulocytes 0.11 (H) 0.00 - 0.07 K/uL  Lactic acid, plasma     Status: None   Collection Time: 04/02/23  5:19 PM  Result Value Ref Range   Lactic Acid, Venous 1.0 0.5 - 1.9 mmol/L  Troponin I (High Sensitivity)     Status: Abnormal   Collection Time: 04/02/23  5:19 PM  Result Value Ref Range   Troponin I (High Sensitivity) 20 (H) <18 ng/L  Urinalysis, Routine w reflex microscopic -Urine, Clean Catch     Status: Abnormal   Collection Time: 04/02/23  7:24 PM  Result Value Ref Range   Color, Urine YELLOW YELLOW   APPearance CLEAR CLEAR   Specific Gravity, Urine 1.013 1.005 - 1.030   pH 6.0 5.0 - 8.0   Glucose, UA NEGATIVE NEGATIVE mg/dL    Hgb urine dipstick NEGATIVE NEGATIVE   Bilirubin Urine NEGATIVE NEGATIVE   Ketones, ur 5 (A) NEGATIVE mg/dL   Protein, ur NEGATIVE NEGATIVE mg/dL   Nitrite NEGATIVE NEGATIVE   Leukocytes,Ua NEGATIVE NEGATIVE   IMPRESSION: 1. No evidence of pulmonary embolism. 2. Lobulated mass with surrounding satellite nodules in the right upper lobe measuring 3.9 x 6.7 cm. Consider one of the following in 3 months for both low-risk and high-risk individuals: (a) repeat chest CT, (b) follow-up PET-CT, or (c) tissue sampling. This recommendation follows the consensus statement: Guidelines for Management of Incidental Pulmonary Nodules Detected on CT Images: From the Fleischner Society 2017; Radiology 2017; 284:228-243. 3. Focal pleural thickening in the right upper lobe with questionable erosions of the adjacent T5 rib on the right, possible metastatic disease. 4. Scattered 2-3 mm nodules bilaterally. Attention on follow-up is recommended. 5. Atelectasis or infiltrate in the right middle lobe. 6. Emphysema. 7. Aortic atherosclerosis and coronary artery calcifications.  Assessment and Plan: Lobulated mass on chest CT Hypoxemic respiratory failure now on 6L Unintentional Weight loss Tobacco abuse Mild COPD Exacerbation Severe gingivitis/dental infection/possible thrush  - Continue Oxygen as needed.  Currently on 6L -  Nebs, Prednisone, antibiotics per protocol.  - Cancer work up should include CT of abdomen and pelvis once she can have more contrast  - Oncology consulted. I spoke to Dr Ellin Saba by phone. - Symptomatic mouth care.  Start Augmentin. No steroid inhalers at this time. A soothing oral rinse would be nice.   Advance Care Planning:   Code Status: Prior DNR The patient  reports that she does not want to die because she wants to be here for her daughter who needs her, but she would not want to be on life support.  She would rather pass peacefully when it is her time.  Consults:  none  Family Communication: none  Severity of Illness: The appropriate patient status for this patient is INPATIENT. Inpatient status is judged to be reasonable and necessary in order to provide the required intensity of service to ensure the patient's safety. The patient's presenting symptoms, physical exam findings, and initial radiographic and laboratory data in the context of their chronic comorbidities is felt to place them at high risk for further clinical deterioration. Furthermore, it is not anticipated that the patient will be medically stable for discharge from the hospital within 2 midnights of admission.   * I certify that at the point of admission it is my clinical judgment that the patient will require inpatient hospital care spanning beyond 2 midnights from the point of admission due to high intensity of service, high risk for further deterioration and high frequency of surveillance required.*  Author: Buena Irish, MD 04/02/2023 8:58 PM  For on call review [email or web address].

## 2023-04-03 ENCOUNTER — Encounter: Payer: Self-pay | Admitting: Pulmonary Disease

## 2023-04-03 ENCOUNTER — Telehealth: Payer: Self-pay | Admitting: Pulmonary Disease

## 2023-04-03 DIAGNOSIS — R918 Other nonspecific abnormal finding of lung field: Secondary | ICD-10-CM

## 2023-04-03 DIAGNOSIS — K029 Dental caries, unspecified: Secondary | ICD-10-CM | POA: Diagnosis not present

## 2023-04-03 DIAGNOSIS — J9601 Acute respiratory failure with hypoxia: Secondary | ICD-10-CM | POA: Diagnosis not present

## 2023-04-03 DIAGNOSIS — K0510 Chronic gingivitis, plaque induced: Secondary | ICD-10-CM | POA: Diagnosis not present

## 2023-04-03 LAB — CBC
HCT: 42.9 % (ref 36.0–46.0)
Hemoglobin: 14.4 g/dL (ref 12.0–15.0)
MCH: 33 pg (ref 26.0–34.0)
MCHC: 33.6 g/dL (ref 30.0–36.0)
MCV: 98.2 fL (ref 80.0–100.0)
Platelets: 248 10*3/uL (ref 150–400)
RBC: 4.37 MIL/uL (ref 3.87–5.11)
RDW: 16.9 % — ABNORMAL HIGH (ref 11.5–15.5)
WBC: 13.6 10*3/uL — ABNORMAL HIGH (ref 4.0–10.5)
nRBC: 0 % (ref 0.0–0.2)

## 2023-04-03 MED ORDER — IPRATROPIUM-ALBUTEROL 0.5-2.5 (3) MG/3ML IN SOLN
3.0000 mL | Freq: Three times a day (TID) | RESPIRATORY_TRACT | Status: DC
  Administered 2023-04-04 (×2): 3 mL via RESPIRATORY_TRACT
  Filled 2023-04-03 (×2): qty 3

## 2023-04-03 MED ORDER — AMOXICILLIN-POT CLAVULANATE 875-125 MG PO TABS
1.0000 | ORAL_TABLET | Freq: Two times a day (BID) | ORAL | Status: DC
  Administered 2023-04-03 – 2023-04-04 (×3): 1 via ORAL
  Filled 2023-04-03 (×3): qty 1

## 2023-04-03 MED ORDER — PNEUMOCOCCAL 20-VAL CONJ VACC 0.5 ML IM SUSY: 0.5000 mL | PREFILLED_SYRINGE | INTRAMUSCULAR | Status: DC | PRN

## 2023-04-03 MED ORDER — MAGNESIUM SULFATE 4 GM/100ML IV SOLN
4.0000 g | Freq: Once | INTRAVENOUS | Status: AC
  Administered 2023-04-03: 4 g via INTRAVENOUS
  Filled 2023-04-03: qty 100

## 2023-04-03 MED ORDER — VERAPAMIL HCL 120 MG PO TABS
120.0000 mg | ORAL_TABLET | Freq: Two times a day (BID) | ORAL | Status: DC
  Administered 2023-04-03 – 2023-04-04 (×3): 120 mg via ORAL
  Filled 2023-04-03 (×8): qty 1

## 2023-04-03 MED ORDER — ENSURE ENLIVE PO LIQD: 237.0000 mL | Freq: Two times a day (BID) | ORAL | Status: DC

## 2023-04-03 MED ORDER — IPRATROPIUM-ALBUTEROL 0.5-2.5 (3) MG/3ML IN SOLN
3.0000 mL | Freq: Four times a day (QID) | RESPIRATORY_TRACT | Status: DC
  Administered 2023-04-03 (×2): 3 mL via RESPIRATORY_TRACT
  Filled 2023-04-03 (×2): qty 3

## 2023-04-03 MED ORDER — RISAQUAD PO CAPS
2.0000 | ORAL_CAPSULE | Freq: Three times a day (TID) | ORAL | Status: DC
  Administered 2023-04-03 – 2023-04-04 (×4): 2 via ORAL
  Filled 2023-04-03 (×4): qty 2

## 2023-04-03 MED ORDER — BACID PO TABS
2.0000 | ORAL_TABLET | Freq: Three times a day (TID) | ORAL | Status: DC
  Filled 2023-04-03 (×7): qty 2

## 2023-04-03 MED ORDER — TRAZODONE HCL 50 MG PO TABS
50.0000 mg | ORAL_TABLET | Freq: Every evening | ORAL | Status: DC | PRN
  Administered 2023-04-03: 50 mg via ORAL
  Filled 2023-04-03 (×2): qty 1

## 2023-04-03 NOTE — H&P (View-Only) (Signed)
 NAME:  Kathleen Bennett, MRN:  [id number], DOB:  June 19, 1961, LOS: 1 ADMISSION DATE:  04/02/2023, CONSULTATION DATE:  04/03/2023 REFERRING MD:  Dr. Theodosia Quay, CHIEF COMPLAINT:  lung mass   History of Present Illness:   This is a 62 year old female presents to the hospital with shortness of breath.  Diagnosed with a COPD exacerbation CT imaging of the chest completed on presentation to the ER which reveals a large right upper lobe mass with some areas of pleural study concerning for advanced malignancy.  We talked today about moving forward with considerations of bronchoscopy and biopsy.  Patient has been put to sleep before.  She has poor dentition.  She is not on any blood thinners.  She does take a daily baby 81 mg aspirin.  Virtual video pulmonary consult visit.  Pertinent  Medical History   Past Medical History:  Diagnosis Date   Anxiety    COPD (chronic obstructive pulmonary disease) (HCC)    DDD (degenerative disc disease), lumbar    Diverticulitis    GERD (gastroesophageal reflux disease)    Heart murmur    High cholesterol    Hypertension    PONV (postoperative nausea and vomiting)     Significant Hospital Events: Including procedures, antibiotic start and stop dates in addition to other pertinent events     Interim History / Subjective:  Per HPI above  Objective   Blood pressure (!) 159/97, pulse 96, temperature 98.3 F (36.8 C), temperature source Oral, resp. rate 19, height 5\' 3"  (1.6 m), weight 53.5 kg, SpO2 97%.    FiO2 (%):  [40 %] 40 %   Intake/Output Summary (Last 24 hours) at 04/03/2023 1324 Last data filed at 04/03/2023 1127 Gross per 24 hour  Intake 900.14 ml  Output --  Net 900.14 ml   Filed Weights   04/02/23 1455  Weight: 53.5 kg    Examination: General: Elderly female resting comfortably in bed appears older than stated age HENT: NCAT tracking appropriately Exam was completed virtually.  Resolved Hospital Problem list     Assessment & Plan:    Right upper lobe lung mass Adenopathy Upper lobe predominant emphysema Longstanding history of tobacco use. Mild copd exacerbation  Acute on chronic hypercapenic and hypoxemic respiratory failure  Plan: Imaging is concerning for advanced malignancy. Will get them set up for an outpatient bronchoscopy. They also need a nuclear medicine PET scan for complete staging. Will also likely need a brain MRI. Once we get her tissue diagnosis we will get him set up to see Dr. Kirtland Bouchard at the cancer center in [facility]. Agree with inhalers and steroids + augmentin course    Acute exacerbation of chronic respiratory failure with hypoxia and hypercapnia and COPD. - patients blood gas with co2 retention  - The addition of an auto titrating feature for ventilation is required with hypercapnic patient to decrease PaCO2 more efficiently and rapidly due to the severity of their condition.  Patient continues to persist with hypercapnic respiratory failure on BiPAP used during this hospitalization. - Orders signed for home nocturnal NIV with [facility]    We appreciate consultation. Tentative bronchoscopy date will be on 04/09/2023 or 04/16/2023.   Labs   CBC: Recent Labs  Lab 04/02/23 1531 04/03/23 0644  WBC 14.4* 13.6*  NEUTROABS 11.7*  --   HGB 15.5* 14.4  HCT 45.4 42.9  MCV 97.4 98.2  PLT 294 248    Basic Metabolic Panel: Recent Labs  Lab 04/02/23 1531 04/03/23 0532  NA 134* 134*  K 3.2* 3.8  CL 90* 96*  CO2 28 24  GLUCOSE 94 126*  BUN 17 9  CREATININE 0.90 0.52  CALCIUM 10.3 9.9  MG  --  1.2*   GFR: Estimated Creatinine Clearance: 59.5 mL/min (by C-G formula based on SCr of 0.52 mg/dL). Recent Labs  Lab 04/02/23 1531 04/02/23 1719 04/03/23 0644  WBC 14.4*  --  13.6*  LATICACIDVEN 1.3 1.0  --     Liver Function Tests: Recent Labs  Lab 04/02/23 1531  AST 23  ALT 21  ALKPHOS 68  BILITOT 0.8  PROT 7.1  ALBUMIN 4.0   No results for input(s): "LIPASE", "AMYLASE" in the last  168 hours. No results for input(s): "AMMONIA" in the last 168 hours.  ABG    Component Value Date/Time   PHART 7.42 04/02/2023 2108   PCO2ART 48 04/02/2023 2108   PO2ART 66 (L) 04/02/2023 2108   HCO3 31.1 (H) 04/02/2023 2108   TCO2 22.6 01/15/2008 0455   O2SAT 94.9 04/02/2023 2108     Coagulation Profile: No results for input(s): "INR", "PROTIME" in the last 168 hours.  Cardiac Enzymes: No results for input(s): "CKTOTAL", "CKMB", "CKMBINDEX", "TROPONINI" in the last 168 hours.  HbA1C: No results found for: "HGBA1C"  CBG: No results for input(s): "GLUCAP" in the last 168 hours.  Review of Systems:   Review of Systems  Constitutional:  Negative for chills, fever, malaise/fatigue and weight loss.  HENT:  Negative for hearing loss, sore throat and tinnitus.   Eyes:  Negative for blurred vision and double vision.  Respiratory:  Positive for cough, sputum production and shortness of breath. Negative for hemoptysis, wheezing and stridor.   Cardiovascular:  Negative for chest pain, palpitations, orthopnea, leg swelling and PND.  Gastrointestinal:  Negative for abdominal pain, constipation, diarrhea, heartburn, nausea and vomiting.  Genitourinary:  Negative for dysuria, hematuria and urgency.  Musculoskeletal:  Negative for joint pain and myalgias.  Skin:  Negative for itching and rash.  Neurological:  Negative for dizziness, tingling, weakness and headaches.  Endo/Heme/Allergies:  Negative for environmental allergies. Does not bruise/bleed easily.  Psychiatric/Behavioral:  Negative for depression. The patient is not nervous/anxious and does not have insomnia.   All other systems reviewed and are negative.    Past Medical History:  She,  has a past medical history of Anxiety, COPD (chronic obstructive pulmonary disease) (HCC), DDD (degenerative disc disease), lumbar, Diverticulitis, GERD (gastroesophageal reflux disease), Heart murmur, High cholesterol, Hypertension, and PONV  (postoperative nausea and vomiting).   Surgical History:   Past Surgical History:  Procedure Laterality Date   ABDOMINAL HYSTERECTOMY     BIOPSY  10/12/2021   Procedure: BIOPSY;  Surgeon: Lanelle Bal, DO;  Location: [facility];  Service: Endoscopy;;   COLONOSCOPY WITH PROPOFOL N/A 10/12/2021   Procedure: COLONOSCOPY WITH PROPOFOL;  Surgeon: Lanelle Bal, DO;  Location: [facility];  Service: Endoscopy;  Laterality: N/A;  1:30 / ASA 3     Social History:   reports that she has been smoking cigarettes. She has a 60 pack-year smoking history. She has never used smokeless tobacco. She reports current alcohol use. She reports that she does not use drugs.   Family History:  Her family history is not on file.   Allergies Allergies  Allergen Reactions   Codeine Itching, Other (See Comments) and Rash    REACTION: Severe itching, feels like skin is crawling     Home Medications  Prior to Admission medications   Medication  Sig Start Date End Date Taking? Authorizing Provider  albuterol (PROVENTIL HFA;VENTOLIN HFA) 108 (90 BASE) MCG/ACT inhaler Inhale 2 puffs into the lungs every 6 (six) hours as needed for shortness of breath or wheezing.   Yes [provider]  albuterol (PROVENTIL) (2.5 MG/3ML) 0.083% nebulizer solution Take 2.5 mg by nebulization every 6 (six) hours as needed for wheezing or shortness of breath.   Yes [provider]  aspirin EC 81 MG tablet Take 81 mg by mouth daily. Swallow whole.   Yes [provider]  Aspirin-Acetaminophen-Caffeine (GOODYS EXTRA STRENGTH) [phone] MG PACK Take 1 packet by mouth 2 (two) times daily as needed (pain.).   Yes [provider]  baclofen (LIORESAL) 10 MG tablet Take 10 mg by mouth every 8 (eight) hours as needed. 03/26/23  Yes [provider]  Cholecalciferol (VITAMIN D-3) 125 MCG (5000 UT) TABS Take 5,000 Units by mouth in the morning.   Yes [provider]  furosemide  (LASIX) 40 MG tablet Take 20 mg by mouth daily as needed for fluid or edema. 03/18/23  Yes [provider]  lovastatin (MEVACOR) 20 MG tablet Take 20 mg by mouth every morning.   Yes [provider]  omeprazole (PRILOSEC) 20 MG capsule Take 20 mg by mouth daily before breakfast.   Yes [provider]  PARoxetine (PAXIL) 20 MG tablet Take 20 mg by mouth in the morning.   Yes [provider]  potassium chloride (KLOR-CON) 10 MEQ tablet Take 10 mEq by mouth every morning. 03/22/23  Yes [provider]  simethicone (MYLICON) 125 MG chewable tablet Chew 125 mg by mouth every 6 (six) hours as needed for flatulence.   Yes [provider]  SYMBICORT 80-4.5 MCG/ACT inhaler Inhale 2 puffs into the lungs in the morning and at bedtime. 05/16/15  Yes [provider]  verapamil (CALAN) 120 MG tablet Take 240 mg by mouth 2 (two) times daily.   Yes [provider]  vitamin B-12 (CYANOCOBALAMIN) 1000 MCG tablet Take 1,000 mcg by mouth in the morning.   Yes [provider]     Josephine Igo, DO [facility] 04/03/2023 1:24 PM

## 2023-04-03 NOTE — Progress Notes (Signed)
   04/03/23 1250  TOC Brief Assessment  Insurance and Status Reviewed  Patient has primary care physician Yes  Home environment has been reviewed with spouse  Prior level of function: independent  Prior/Current Home Services No current home services  Social Determinants of Health Reivew SDOH reviewed no interventions necessary  Readmission risk has been reviewed Yes  Transition of care needs no transition of care needs at this time   Transition of Care Department [facility]) has reviewed patient and no TOC needs have been identified at this time. We will continue to monitor patient advancement through interdisciplinary progression rounds. If new patient transition needs arise, please place a TOC consult.

## 2023-04-03 NOTE — Telephone Encounter (Signed)
PCCM:  Please see inpatient virtual consultation completed at [facility] today.  Bronchoscopy hopefully on 20th or 27th. Would like to get PET scan complete prior to bronchoscopy.  Orders have been placed and sent to the office for scheduling.  Josephine Igo, DO [facility] 04/03/2023 1:33 PM

## 2023-04-03 NOTE — Hospital Course (Signed)
62 y.o. female with medical history significant for hypertension, COPD, and many dental issues who presented to the hospital because of mouth pain.  She has seen her dentist and an Transport planner and has been having ongoing work but she is not getting any better.  She says she cannot eat or drink anything because of the pain in her mouth.  While in the emergency department it was noted that her O2 sats on room air were 81%.  On 3 L O2 nasal cannula her O2 sats were 91%.  She did have mild expiratory wheezing.  Because her hypoxia was much greater than the level of her wheezing the patient did have a CT scan of her chest which shows multiple nodules and a suspicious for lung cancer.  The patient will be admitted for's hypoxia and further workup.

## 2023-04-03 NOTE — Progress Notes (Signed)
PROGRESS NOTE   Kathleen Bennett  XBM:[id number] DOB: Sep 15, 1960 DOA: 04/02/2023 PCP: Juel Burrow, NP   Chief Complaint  Patient presents with   hypoxia   Level of care: Med-Surg  Brief Admission History:   62 y.o. female with medical history significant for hypertension, COPD, and many dental issues who presented to the hospital because of mouth pain.  She has seen her dentist and an Transport planner and has been having ongoing work but she is not getting any better.  She says she cannot eat or drink anything because of the pain in her mouth.  While in the emergency department it was noted that her O2 sats on room air were 81%.  On 3 L O2 nasal cannula her O2 sats were 91%.  She did have mild expiratory wheezing.  Because her hypoxia was much greater than the level of her wheezing the patient did have a CT scan of her chest which shows multiple nodules and a suspicious for lung cancer.  The patient will be admitted for's hypoxia and further workup.    Assessment and Plan:  Acute hypoxemic respiratory failure  - secondary to COPD - secondary to spiculated lung mass RUL - continue supplemental oxygen to goal >87% spO2 - continue supportive bronchodilators as ordered  Mild COPD exacerbation  - continue current measures  - wean oxygen as able   Abnormal weight loss - secondary to presumed pulmonary malignancy - supportive care as ordered   Spiculated RUL Lung Mass - presumed maligancy  - pt wants to pursue diagnosis and treatment  - consultation requested for oncology and pulmonology  - further recommendations to follow   Severe Oral Infection / Severe Gingivitis / dental caries  - Pt strongly advised to follow up with dental care provider ASAP to have teeth extracted - for now, continue oral augmentin and pain medication for symptoms   DNR  present on admission  - continuing DNR order while in hospital   DVT prophylaxis: [place] heparin  Code Status: DNR  Family Communication:   Disposition: anticipate DC home when medically cleared    Consultants:  Oncology  Pulmonology   Procedures:    Antimicrobials:    Subjective: Pt reports cought and chest congestion, denies hemoptysis   Objective: Vitals:   04/03/23 0900 04/03/23 0930 04/03/23 1000 04/03/23 1100  BP: (!) 171/89 (!) 144/75 (!) 153/83 (!) 158/89  Pulse: 99 (!) 102 99 93  Resp: 16 19 (!) 21 (!) 24  Temp:   98.5 F (36.9 C) 98.5 F (36.9 C)  TempSrc:      SpO2: 93% 90% 90% 95%  Weight:      Height:        Intake/Output Summary (Last 24 hours) at 04/03/2023 1201 Last data filed at 04/03/2023 1127 Gross per 24 hour  Intake 900.14 ml  Output --  Net 900.14 ml   Filed Weights   04/02/23 1455  Weight: 53.5 kg   Examination:  General exam: frail, emaciated, chronically ill appearing female, Appears calm and comfortable -- oral exam: severe gingivitis and dental caries seen  Respiratory system: no increased work of breathing, wheezing heard on right base. Cardiovascular system: normal S1 & S2 heard. No JVD, murmurs, rubs, gallops or clicks. No pedal edema. Gastrointestinal system: Abdomen is nondistended, soft and nontender. No organomegaly or masses felt. Normal bowel sounds heard. Central nervous system: Alert and oriented. No focal neurological deficits. Extremities: Symmetric 5 x 5 power. Skin: No rashes, lesions or ulcers.  Psychiatry: Judgement and insight appear normal. Mood & affect appropriate.   Data Reviewed: I have personally reviewed following labs and imaging studies  CBC: Recent Labs  Lab 04/02/23 1531 04/03/23 0644  WBC 14.4* 13.6*  NEUTROABS 11.7*  --   HGB 15.5* 14.4  HCT 45.4 42.9  MCV 97.4 98.2  PLT 294 248    Basic Metabolic Panel: Recent Labs  Lab 04/02/23 1531 04/03/23 0532  NA 134* 134*  K 3.2* 3.8  CL 90* 96*  CO2 28 24  GLUCOSE 94 126*  BUN 17 9  CREATININE 0.90 0.52  CALCIUM 10.3 9.9  MG  --  1.2*    CBG: No results for input(s):  "GLUCAP" in the last 168 hours.  No results found for this or any previous visit (from the past 240 hour(s)).   Radiology Studies: CT Angio Chest PE W/Cm &/Or Wo Cm  Result Date: 04/02/2023 CLINICAL DATA:  Pulmonary embolism suspected, high probability. Productive cough for 1 month, shortness of breath. History of COPD. EXAM: CT ANGIOGRAPHY CHEST WITH CONTRAST TECHNIQUE: Multidetector CT imaging of the chest was performed using the standard protocol during bolus administration of intravenous contrast. Multiplanar CT image reconstructions and MIPs were obtained to evaluate the vascular anatomy. RADIATION DOSE REDUCTION: This exam was performed according to the departmental dose-optimization program which includes automated exposure control, adjustment of the mA and/or kV according to patient size and/or use of iterative reconstruction technique. CONTRAST:  75mL OMNIPAQUE IOHEXOL 350 MG/ML SOLN COMPARISON:  None Available. FINDINGS: Cardiovascular: Heart is normal in size and there is a trace pericardial effusion. Multi-vessel coronary artery calcifications are noted. There is atherosclerotic calcification of the aorta without evidence of aneurysm. The pulmonary trunk is normal in caliber. No evidence of pulmonary embolism. Mediastinum/Nodes: No mediastinal or axillary lymphadenopathy. Enlarged lymph nodes are present the right hilum measuring up to 1.9 cm. Coarse calcifications are noted in the left lobe of the thyroid gland. The trachea and esophagus are within normal limits. Lungs/Pleura: Paraseptal and centrilobular emphysematous changes are present in the lungs. There is a lobular mass in the right upper lobe measuring 3.8 x 1.7 cm, axial image 42. Surrounding pulmonary nodules are noted in the right upper lobe measuring up to 8 mm. There is pleural thickening in the right upper lobe measuring 1.8 cm. There is narrowing of the right middle lobe bronchus with associated atelectasis and consolidation. Small  2-3 mm nodules are noted in the lungs bilaterally. No effusion or pneumothorax. Upper Abdomen: A subcentimeter hypodensity is noted in the left lobe of the liver which is too small to further characterize. No acute abnormality. Musculoskeletal: Degenerative changes are present in the thoracic spine. There are questionable erosions of the T5 rib on the right in the region of focal pleural thickening. Compression deformities are noted in the superior endplates at T10, Q46, and L1. Review of the MIP images confirms the above findings. IMPRESSION: 1. No evidence of pulmonary embolism. 2. Lobulated mass with surrounding satellite nodules in the right upper lobe measuring 3.9 x 6.7 cm. Consider one of the following in 3 months for both low-risk and high-risk individuals: (a) repeat chest CT, (b) follow-up PET-CT, or (c) tissue sampling. This recommendation follows the consensus statement: Guidelines for Management of Incidental Pulmonary Nodules Detected on CT Images: From the Fleischner Society 2017; Radiology 2017; 284:228-243. 3. Focal pleural thickening in the right upper lobe with questionable erosions of the adjacent T5 rib on the right, possible metastatic disease. 4. Scattered 2-3  mm nodules bilaterally. Attention on follow-up is recommended. 5. Atelectasis or infiltrate in the right middle lobe. 6. Emphysema. 7. Aortic atherosclerosis and coronary artery calcifications. Electronically Signed   By: Thornell Sartorius M.D.   On: 04/02/2023 20:33   DG Chest 2 View  Result Date: 04/02/2023 CLINICAL DATA:  Shortness of breath.  Swelling to the right face. EXAM: CHEST - 2 VIEW COMPARISON:  01/23/2021 FINDINGS: Heart size and pulmonary vascularity are normal. Lungs are clear. No pleural effusion or pneumothorax. Degenerative changes in the spine. Severe degenerative change in the left shoulder. Calcification of the aorta. IMPRESSION: No active cardiopulmonary disease. Electronically Signed   By: Burman Nieves M.D.    On: 04/02/2023 16:30    Scheduled Meds:  acidophilus  2 capsule Oral TID   amoxicillin-clavulanate  1 tablet Oral BID   aspirin EC  81 mg Oral Daily   docusate sodium  100 mg Oral BID   heparin  5,000 Units Subcutaneous Q8H   ipratropium-albuterol  3 mL Nebulization Q6H   pantoprazole  40 mg Oral Daily   PARoxetine  20 mg Oral q AM   predniSONE  50 mg Oral Q breakfast   verapamil  120 mg Oral Q12H   Continuous Infusions:  dextrose 5% lactated ringers Stopped (04/03/23 0825)     LOS: 1 day   Time spent: 49 mins   Laural Benes, MD How to contact the [facility] Attending or Consulting provider 7A - 7P or covering provider during after hours 7P -7A, for this patient?  Check the care team in [facility] and look for a) attending/consulting [facility] provider listed and b) the [facility] team listed Log into [email or web address] and use [place]'s universal password to access. If you do not have the password, please contact the hospital operator. Locate the [facility] provider you are looking for under [facility] and page to a number that you can be directly reached. If you still have difficulty reaching the provider, please page the [facility] (Director on Call) for the Hospitalists listed on amion for assistance.  04/03/2023, 12:01 PM

## 2023-04-03 NOTE — ED Notes (Signed)
Patient called out for soiled bed, needed changing. Gave patient a complete bath, changed lines, and put patient back on cardiac monitor. Patient is comfortable in bed at this time. Nurse notified.

## 2023-04-03 NOTE — Plan of Care (Signed)

## 2023-04-03 NOTE — Consult Note (Addendum)
NAME:  Kathleen Bennett, MRN:  [id number], DOB:  June 19, 1961, LOS: 1 ADMISSION DATE:  04/02/2023, CONSULTATION DATE:  04/03/2023 REFERRING MD:  Dr. Theodosia Quay, CHIEF COMPLAINT:  lung mass   History of Present Illness:   This is a 62 year old female presents to the hospital with shortness of breath.  Diagnosed with a COPD exacerbation CT imaging of the chest completed on presentation to the ER which reveals a large right upper lobe mass with some areas of pleural study concerning for advanced malignancy.  We talked today about moving forward with considerations of bronchoscopy and biopsy.  Patient has been put to sleep before.  She has poor dentition.  She is not on any blood thinners.  She does take a daily baby 81 mg aspirin.  Virtual video pulmonary consult visit.  Pertinent  Medical History   Past Medical History:  Diagnosis Date   Anxiety    COPD (chronic obstructive pulmonary disease) (HCC)    DDD (degenerative disc disease), lumbar    Diverticulitis    GERD (gastroesophageal reflux disease)    Heart murmur    High cholesterol    Hypertension    PONV (postoperative nausea and vomiting)     Significant Hospital Events: Including procedures, antibiotic start and stop dates in addition to other pertinent events     Interim History / Subjective:  Per HPI above  Objective   Blood pressure (!) 159/97, pulse 96, temperature 98.3 F (36.8 C), temperature source Oral, resp. rate 19, height 5\' 3"  (1.6 m), weight 53.5 kg, SpO2 97%.    FiO2 (%):  [40 %] 40 %   Intake/Output Summary (Last 24 hours) at 04/03/2023 1324 Last data filed at 04/03/2023 1127 Gross per 24 hour  Intake 900.14 ml  Output --  Net 900.14 ml   Filed Weights   04/02/23 1455  Weight: 53.5 kg    Examination: General: Elderly female resting comfortably in bed appears older than stated age HENT: NCAT tracking appropriately Exam was completed virtually.  Resolved Hospital Problem list     Assessment & Plan:    Right upper lobe lung mass Adenopathy Upper lobe predominant emphysema Longstanding history of tobacco use. Mild copd exacerbation  Acute on chronic hypercapenic and hypoxemic respiratory failure  Plan: Imaging is concerning for advanced malignancy. Will get them set up for an outpatient bronchoscopy. They also need a nuclear medicine PET scan for complete staging. Will also likely need a brain MRI. Once we get her tissue diagnosis we will get him set up to see Dr. Kirtland Bouchard at the cancer center in [facility]. Agree with inhalers and steroids + augmentin course    Acute exacerbation of chronic respiratory failure with hypoxia and hypercapnia and COPD. - patients blood gas with co2 retention  - The addition of an auto titrating feature for ventilation is required with hypercapnic patient to decrease PaCO2 more efficiently and rapidly due to the severity of their condition.  Patient continues to persist with hypercapnic respiratory failure on BiPAP used during this hospitalization. - Orders signed for home nocturnal NIV with [facility]    We appreciate consultation. Tentative bronchoscopy date will be on 04/09/2023 or 04/16/2023.   Labs   CBC: Recent Labs  Lab 04/02/23 1531 04/03/23 0644  WBC 14.4* 13.6*  NEUTROABS 11.7*  --   HGB 15.5* 14.4  HCT 45.4 42.9  MCV 97.4 98.2  PLT 294 248    Basic Metabolic Panel: Recent Labs  Lab 04/02/23 1531 04/03/23 0532  NA 134* 134*  K 3.2* 3.8  CL 90* 96*  CO2 28 24  GLUCOSE 94 126*  BUN 17 9  CREATININE 0.90 0.52  CALCIUM 10.3 9.9  MG  --  1.2*   GFR: Estimated Creatinine Clearance: 59.5 mL/min (by C-G formula based on SCr of 0.52 mg/dL). Recent Labs  Lab 04/02/23 1531 04/02/23 1719 04/03/23 0644  WBC 14.4*  --  13.6*  LATICACIDVEN 1.3 1.0  --     Liver Function Tests: Recent Labs  Lab 04/02/23 1531  AST 23  ALT 21  ALKPHOS 68  BILITOT 0.8  PROT 7.1  ALBUMIN 4.0   No results for input(s): "LIPASE", "AMYLASE" in the last  168 hours. No results for input(s): "AMMONIA" in the last 168 hours.  ABG    Component Value Date/Time   PHART 7.42 04/02/2023 2108   PCO2ART 48 04/02/2023 2108   PO2ART 66 (L) 04/02/2023 2108   HCO3 31.1 (H) 04/02/2023 2108   TCO2 22.6 01/15/2008 0455   O2SAT 94.9 04/02/2023 2108     Coagulation Profile: No results for input(s): "INR", "PROTIME" in the last 168 hours.  Cardiac Enzymes: No results for input(s): "CKTOTAL", "CKMB", "CKMBINDEX", "TROPONINI" in the last 168 hours.  HbA1C: No results found for: "HGBA1C"  CBG: No results for input(s): "GLUCAP" in the last 168 hours.  Review of Systems:   Review of Systems  Constitutional:  Negative for chills, fever, malaise/fatigue and weight loss.  HENT:  Negative for hearing loss, sore throat and tinnitus.   Eyes:  Negative for blurred vision and double vision.  Respiratory:  Positive for cough, sputum production and shortness of breath. Negative for hemoptysis, wheezing and stridor.   Cardiovascular:  Negative for chest pain, palpitations, orthopnea, leg swelling and PND.  Gastrointestinal:  Negative for abdominal pain, constipation, diarrhea, heartburn, nausea and vomiting.  Genitourinary:  Negative for dysuria, hematuria and urgency.  Musculoskeletal:  Negative for joint pain and myalgias.  Skin:  Negative for itching and rash.  Neurological:  Negative for dizziness, tingling, weakness and headaches.  Endo/Heme/Allergies:  Negative for environmental allergies. Does not bruise/bleed easily.  Psychiatric/Behavioral:  Negative for depression. The patient is not nervous/anxious and does not have insomnia.   All other systems reviewed and are negative.    Past Medical History:  She,  has a past medical history of Anxiety, COPD (chronic obstructive pulmonary disease) (HCC), DDD (degenerative disc disease), lumbar, Diverticulitis, GERD (gastroesophageal reflux disease), Heart murmur, High cholesterol, Hypertension, and PONV  (postoperative nausea and vomiting).   Surgical History:   Past Surgical History:  Procedure Laterality Date   ABDOMINAL HYSTERECTOMY     BIOPSY  10/12/2021   Procedure: BIOPSY;  Surgeon: Lanelle Bal, DO;  Location: [facility];  Service: Endoscopy;;   COLONOSCOPY WITH PROPOFOL N/A 10/12/2021   Procedure: COLONOSCOPY WITH PROPOFOL;  Surgeon: Lanelle Bal, DO;  Location: [facility];  Service: Endoscopy;  Laterality: N/A;  1:30 / ASA 3     Social History:   reports that she has been smoking cigarettes. She has a 60 pack-year smoking history. She has never used smokeless tobacco. She reports current alcohol use. She reports that she does not use drugs.   Family History:  Her family history is not on file.   Allergies Allergies  Allergen Reactions   Codeine Itching, Other (See Comments) and Rash    REACTION: Severe itching, feels like skin is crawling     Home Medications  Prior to Admission medications   Medication  Sig Start Date End Date Taking? Authorizing Provider  albuterol (PROVENTIL HFA;VENTOLIN HFA) 108 (90 BASE) MCG/ACT inhaler Inhale 2 puffs into the lungs every 6 (six) hours as needed for shortness of breath or wheezing.   Yes [provider]  albuterol (PROVENTIL) (2.5 MG/3ML) 0.083% nebulizer solution Take 2.5 mg by nebulization every 6 (six) hours as needed for wheezing or shortness of breath.   Yes [provider]  aspirin EC 81 MG tablet Take 81 mg by mouth daily. Swallow whole.   Yes [provider]  Aspirin-Acetaminophen-Caffeine (GOODYS EXTRA STRENGTH) [phone] MG PACK Take 1 packet by mouth 2 (two) times daily as needed (pain.).   Yes [provider]  baclofen (LIORESAL) 10 MG tablet Take 10 mg by mouth every 8 (eight) hours as needed. 03/26/23  Yes [provider]  Cholecalciferol (VITAMIN D-3) 125 MCG (5000 UT) TABS Take 5,000 Units by mouth in the morning.   Yes [provider]  furosemide  (LASIX) 40 MG tablet Take 20 mg by mouth daily as needed for fluid or edema. 03/18/23  Yes [provider]  lovastatin (MEVACOR) 20 MG tablet Take 20 mg by mouth every morning.   Yes [provider]  omeprazole (PRILOSEC) 20 MG capsule Take 20 mg by mouth daily before breakfast.   Yes [provider]  PARoxetine (PAXIL) 20 MG tablet Take 20 mg by mouth in the morning.   Yes [provider]  potassium chloride (KLOR-CON) 10 MEQ tablet Take 10 mEq by mouth every morning. 03/22/23  Yes [provider]  simethicone (MYLICON) 125 MG chewable tablet Chew 125 mg by mouth every 6 (six) hours as needed for flatulence.   Yes [provider]  SYMBICORT 80-4.5 MCG/ACT inhaler Inhale 2 puffs into the lungs in the morning and at bedtime. 05/16/15  Yes [provider]  verapamil (CALAN) 120 MG tablet Take 240 mg by mouth 2 (two) times daily.   Yes [provider]  vitamin B-12 (CYANOCOBALAMIN) 1000 MCG tablet Take 1,000 mcg by mouth in the morning.   Yes [provider]     Josephine Igo, DO [facility] 04/03/2023 1:24 PM

## 2023-04-04 DIAGNOSIS — J441 Chronic obstructive pulmonary disease with (acute) exacerbation: Secondary | ICD-10-CM | POA: Diagnosis not present

## 2023-04-04 DIAGNOSIS — J9601 Acute respiratory failure with hypoxia: Secondary | ICD-10-CM | POA: Diagnosis not present

## 2023-04-04 DIAGNOSIS — R918 Other nonspecific abnormal finding of lung field: Secondary | ICD-10-CM | POA: Diagnosis not present

## 2023-04-04 LAB — BASIC METABOLIC PANEL
Anion gap: 13 (ref 5–15)
BUN: 8 mg/dL (ref 8–23)
CO2: 28 mmol/L (ref 22–32)
Calcium: 10.5 mg/dL — ABNORMAL HIGH (ref 8.9–10.3)
Chloride: 95 mmol/L — ABNORMAL LOW (ref 98–111)
Creatinine, Ser: 0.53 mg/dL (ref 0.44–1.00)
GFR, Estimated: >60 mL/min (ref >60)
Glucose, Bld: 115 mg/dL — ABNORMAL HIGH (ref 70–99)
Potassium: 2.9 mmol/L — ABNORMAL LOW (ref 3.5–5.1)
Sodium: 136 mmol/L (ref 135–145)

## 2023-04-04 LAB — MAGNESIUM: Magnesium: 1.7 mg/dL (ref 1.7–2.4)

## 2023-04-04 MED ORDER — ADULT MULTIVITAMIN W/MINERALS CH
1.0000 | ORAL_TABLET | Freq: Every day | ORAL | Status: DC
  Administered 2023-04-04: 1 via ORAL
  Filled 2023-04-04: qty 1

## 2023-04-04 MED ORDER — AMOXICILLIN-POT CLAVULANATE 875-125 MG PO TABS: 1.0000 | ORAL_TABLET | Freq: Two times a day (BID) | ORAL | 0 refills | Status: AC

## 2023-04-04 MED ORDER — PREDNISONE 20 MG PO TABS: 40.0000 mg | ORAL_TABLET | Freq: Every day | ORAL | 0 refills | Status: DC

## 2023-04-04 MED ORDER — ADULT MULTIVITAMIN W/MINERALS CH: 1.0000 | ORAL_TABLET | Freq: Every day | ORAL | Status: DC

## 2023-04-04 MED ORDER — RISAQUAD PO CAPS: 2.0000 | ORAL_CAPSULE | Freq: Every day | ORAL | 0 refills | Status: DC

## 2023-04-04 MED ORDER — RISAQUAD PO CAPS: 2.0000 | ORAL_CAPSULE | Freq: Every day | ORAL | 0 refills | Status: AC

## 2023-04-04 MED ORDER — AMOXICILLIN-POT CLAVULANATE 875-125 MG PO TABS: 1.0000 | ORAL_TABLET | Freq: Two times a day (BID) | ORAL | 0 refills | Status: DC

## 2023-04-04 MED ORDER — PREDNISONE 20 MG PO TABS: 40.0000 mg | ORAL_TABLET | Freq: Every day | ORAL | 0 refills | Status: AC

## 2023-04-04 MED ORDER — MAGNESIUM SULFATE 2 GM/50ML IV SOLN
2.0000 g | Freq: Once | INTRAVENOUS | Status: AC
  Administered 2023-04-04: 2 g via INTRAVENOUS
  Filled 2023-04-04: qty 50

## 2023-04-04 MED ORDER — ENSURE ENLIVE PO LIQD
237.0000 mL | Freq: Three times a day (TID) | ORAL | Status: DC
  Administered 2023-04-04: 237 mL via ORAL

## 2023-04-04 NOTE — Plan of Care (Signed)

## 2023-04-04 NOTE — Discharge Summary (Signed)
Physician Discharge Summary  Kathleen Bennett ZOX:[id number] DOB: 02-26-1961 DOA: 04/02/2023  PCP: Juel Burrow, NP Pulmonary: Corinda Gubler Pulmonary: [facility] Oncology: Dr. Ellin Saba   Admit date: 04/02/2023 Discharge date: 04/04/2023  Admitted From:  Home  Disposition: Home   Recommendations for Outpatient Follow-up:  Follow up with PCP in 1 weeks Follow up with pulmonary clinic for bronchoscopy  Please go to PET scan as scheduled  Please establish care with [facility] Dr. Ellin Saba  Please obtain BMP in 1-2 weeks to follow up potassium   Home Health:  DME home oxygen 5L/min  Discharge Condition: STABLE   CODE STATUS: DNR DIET: resume regular diet as tolerated    Brief Hospitalization Summary: Please see all hospital notes, images, labs for full details of the hospitalization. ADMISSION PROVIDER HPI:  62 y.o. female with medical history significant for hypertension, COPD, and many dental issues who presented to the hospital because of mouth pain.  She has seen her dentist and an Transport planner and has been having ongoing work but she is not getting any better.  She says she cannot eat or drink anything because of the pain in her mouth.  While in the emergency department it was noted that her O2 sats on room air were 81%.  On 3 L O2 nasal cannula her O2 sats were 91%.  She did have mild expiratory wheezing.  Because her hypoxia was much greater than the level of her wheezing the patient did have a CT scan of her chest which shows multiple nodules and a suspicious for lung cancer.  The patient will be admitted for's hypoxia and further workup.   HOSPITAL COURSE BY PROBLEM LIST   Acute hypoxemic respiratory failure  - secondary to COPD EXACERBATION  - secondary to spiculated lung mass RUL - continue supplemental oxygen to goal >87% spO2 - continue supportive bronchodilators as ordered   Mild COPD exacerbation  - continue current measures  - PT WILL DC HOME ON OXYGEN 5L / MIN -  COMPLETE ORAL PREDNISONE 40 MG DAILY X 7 DAYS - COMPLETE ORAL AUGMENTIN 875 MG BID X 7 DAYS with probiotics - RESUMED HOME BRONCHODILATORS AND HOME NEBULIZER CONFIRMED   Abnormal weight loss - secondary to presumed pulmonary malignancy - supportive care as ordered    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 Ellin Saba consulted and recommended PET scan and follow up in his office [facility] after bronchoscopy completed with biopsy results     Severe Oral Infection / Severe Gingivitis / dental caries  - Pt strongly advised to follow up with dental care provider ASAP to have teeth extracted - for now, continue oral augmentin and pain medication for symptoms    DNR  present on admission  - continued DNR order while in hospital    Discharge Diagnoses:  Principal Problem:   Acute respiratory failure with hypoxemia [facility])   Discharge Instructions: Discharge Instructions     Ambulatory referral to Hematology / Oncology   Complete by: As directed       Allergies as of 04/04/2023       Reactions   Codeine Itching, Other (See Comments), Rash   REACTION: Severe itching, feels like skin is crawling        Medication List  TAKE these medications    acidophilus Caps capsule Take 2 capsules by mouth daily for 14 days.   albuterol 108 (90 Base) MCG/ACT inhaler Commonly known as: VENTOLIN HFA Inhale 2 puffs into the lungs every 6 (six) hours as needed for shortness of breath or wheezing.   albuterol (2.5 MG/3ML) 0.083% nebulizer solution Commonly known as: PROVENTIL Take 2.5 mg by nebulization every 6 (six) hours as needed for wheezing or shortness of breath.   amoxicillin-clavulanate 875-125 MG tablet Commonly known as: AUGMENTIN Take 1 tablet by  mouth 2 (two) times daily for 7 days.   aspirin EC 81 MG tablet Take 81 mg by mouth daily. Swallow whole.   baclofen 10 MG tablet Commonly known as: LIORESAL Take 10 mg by mouth every 8 (eight) hours as needed.   cyanocobalamin 1000 MCG tablet Commonly known as: VITAMIN B12 Take 1,000 mcg by mouth in the morning.   furosemide 40 MG tablet Commonly known as: LASIX Take 20 mg by mouth daily as needed for fluid or edema.   Goodys Extra Strength [id number] MG Pack Generic drug: Aspirin-Acetaminophen-Caffeine Take 1 packet by mouth 2 (two) times daily as needed (pain.).   lovastatin 20 MG tablet Commonly known as: MEVACOR Take 20 mg by mouth every morning.   multivitamin with minerals Tabs tablet Take 1 tablet by mouth daily. Start taking on: April 05, 2023   omeprazole 20 MG capsule Commonly known as: PRILOSEC Take 20 mg by mouth daily before breakfast.   PARoxetine 20 MG tablet Commonly known as: PAXIL Take 20 mg by mouth in the morning.   potassium chloride 10 MEQ tablet Commonly known as: KLOR-CON Take 10 mEq by mouth every morning.   predniSONE 20 MG tablet Commonly known as: DELTASONE Take 2 tablets (40 mg total) by mouth daily with breakfast for 7 days. Start taking on: April 05, 2023   simethicone 125 MG chewable tablet Commonly known as: MYLICON Chew 125 mg by mouth every 6 (six) hours as needed for flatulence.   Symbicort 80-4.5 MCG/ACT inhaler Generic drug: budesonide-formoterol Inhale 2 puffs into the lungs in the morning and at bedtime.   verapamil 120 MG tablet Commonly known as: CALAN Take 240 mg by mouth 2 (two) times daily.   Vitamin D-3 125 MCG (5000 UT) Tabs Take 5,000 Units by mouth in the morning.               Durable Medical Equipment  (From admission, onward)           Start     Ordered   04/04/23 0729  For home use only DME oxygen  Once       Question Answer Comment  Length of Need Lifetime   Mode or (Route) Nasal  cannula   Liters per Minute 5   Frequency Continuous (stationary and portable oxygen unit needed)   Oxygen conserving device Yes   Oxygen delivery system Gas      04/04/23 0728            Follow-up Information     Bevelyn Ngo, NP. Go on 04/26/2023.   Specialty: Pulmonary Disease Why: Hospital Follow Up Contact information: [address] [place] [address] [phone]         Hyler, Janine Limbo, NP. Schedule an appointment as soon as possible for a visit in 1 week(s).   Specialty: Nephrology Why: Hospital Follow Up Contact information: [address] [place] [address] [phone]  PET SCAN Follow up on 04/11/2023.   Why: AS SCHEDULED        BRONCHOSCOPY Follow up.   Why: AS SCHEDULED BY PULMONARY CLINIC               Allergies  Allergen Reactions   Codeine Itching, Other (See Comments) and Rash    REACTION: Severe itching, feels like skin is crawling   Allergies as of 04/04/2023       Reactions   Codeine Itching, Other (See Comments), Rash   REACTION: Severe itching, feels like skin is crawling        Medication List     TAKE these medications    acidophilus Caps capsule Take 2 capsules by mouth daily for 14 days.   albuterol 108 (90 Base) MCG/ACT inhaler Commonly known as: VENTOLIN HFA Inhale 2 puffs into the lungs every 6 (six) hours as needed for shortness of breath or wheezing.   albuterol (2.5 MG/3ML) 0.083% nebulizer solution Commonly known as: PROVENTIL Take 2.5 mg by nebulization every 6 (six) hours as needed for wheezing or shortness of breath.   amoxicillin-clavulanate 875-125 MG tablet Commonly known as: AUGMENTIN Take 1 tablet by mouth 2 (two) times daily for 7 days.   aspirin EC 81 MG tablet Take 81 mg by mouth daily. Swallow whole.   baclofen 10 MG tablet Commonly known as: LIORESAL Take 10 mg by mouth every 8 (eight) hours as needed.   cyanocobalamin 1000 MCG tablet Commonly known as:  VITAMIN B12 Take 1,000 mcg by mouth in the morning.   furosemide 40 MG tablet Commonly known as: LASIX Take 20 mg by mouth daily as needed for fluid or edema.   Goodys Extra Strength [id number] MG Pack Generic drug: Aspirin-Acetaminophen-Caffeine Take 1 packet by mouth 2 (two) times daily as needed (pain.).   lovastatin 20 MG tablet Commonly known as: MEVACOR Take 20 mg by mouth every morning.   multivitamin with minerals Tabs tablet Take 1 tablet by mouth daily. Start taking on: April 05, 2023   omeprazole 20 MG capsule Commonly known as: PRILOSEC Take 20 mg by mouth daily before breakfast.   PARoxetine 20 MG tablet Commonly known as: PAXIL Take 20 mg by mouth in the morning.   potassium chloride 10 MEQ tablet Commonly known as: KLOR-CON Take 10 mEq by mouth every morning.   predniSONE 20 MG tablet Commonly known as: DELTASONE Take 2 tablets (40 mg total) by mouth daily with breakfast for 7 days. Start taking on: April 05, 2023   simethicone 125 MG chewable tablet Commonly known as: MYLICON Chew 125 mg by mouth every 6 (six) hours as needed for flatulence.   Symbicort 80-4.5 MCG/ACT inhaler Generic drug: budesonide-formoterol Inhale 2 puffs into the lungs in the morning and at bedtime.   verapamil 120 MG tablet Commonly known as: CALAN Take 240 mg by mouth 2 (two) times daily.   Vitamin D-3 125 MCG (5000 UT) Tabs Take 5,000 Units by mouth in the morning.               Durable Medical Equipment  (From admission, onward)           Start     Ordered   04/04/23 0729  For home use only DME oxygen  Once       Question Answer Comment  Length of Need Lifetime   Mode or (Route) Nasal cannula   Liters per Minute 5   Frequency Continuous (stationary and portable oxygen unit needed)  Oxygen conserving device Yes   Oxygen delivery system Gas      04/04/23 0728            Procedures/Studies: CT Angio Chest PE W/Cm &/Or Wo Cm  Result Date:  04/02/2023 CLINICAL DATA:  Pulmonary embolism suspected, high probability. Productive cough for 1 month, shortness of breath. History of COPD. EXAM: CT ANGIOGRAPHY CHEST WITH CONTRAST TECHNIQUE: Multidetector CT imaging of the chest was performed using the standard protocol during bolus administration of intravenous contrast. Multiplanar CT image reconstructions and MIPs were obtained to evaluate the vascular anatomy. RADIATION DOSE REDUCTION: This exam was performed according to the departmental dose-optimization program which includes automated exposure control, adjustment of the mA and/or kV according to patient size and/or use of iterative reconstruction technique. CONTRAST:  75mL OMNIPAQUE IOHEXOL 350 MG/ML SOLN COMPARISON:  None Available. FINDINGS: Cardiovascular: Heart is normal in size and there is a trace pericardial effusion. Multi-vessel coronary artery calcifications are noted. There is atherosclerotic calcification of the aorta without evidence of aneurysm. The pulmonary trunk is normal in caliber. No evidence of pulmonary embolism. Mediastinum/Nodes: No mediastinal or axillary lymphadenopathy. Enlarged lymph nodes are present the right hilum measuring up to 1.9 cm. Coarse calcifications are noted in the left lobe of the thyroid gland. The trachea and esophagus are within normal limits. Lungs/Pleura: Paraseptal and centrilobular emphysematous changes are present in the lungs. There is a lobular mass in the right upper lobe measuring 3.8 x 1.7 cm, axial image 42. Surrounding pulmonary nodules are noted in the right upper lobe measuring up to 8 mm. There is pleural thickening in the right upper lobe measuring 1.8 cm. There is narrowing of the right middle lobe bronchus with associated atelectasis and consolidation. Small 2-3 mm nodules are noted in the lungs bilaterally. No effusion or pneumothorax. Upper Abdomen: A subcentimeter hypodensity is noted in the left lobe of the liver which is too small to  further characterize. No acute abnormality. Musculoskeletal: Degenerative changes are present in the thoracic spine. There are questionable erosions of the T5 rib on the right in the region of focal pleural thickening. Compression deformities are noted in the superior endplates at T10, X52, and L1. Review of the MIP images confirms the above findings. IMPRESSION: 1. No evidence of pulmonary embolism. 2. Lobulated mass with surrounding satellite nodules in the right upper lobe measuring 3.9 x 6.7 cm. Consider one of the following in 3 months for both low-risk and high-risk individuals: (a) repeat chest CT, (b) follow-up PET-CT, or (c) tissue sampling. This recommendation follows the consensus statement: Guidelines for Management of Incidental Pulmonary Nodules Detected on CT Images: From the Fleischner Society 2017; Radiology 2017; 284:228-243. 3. Focal pleural thickening in the right upper lobe with questionable erosions of the adjacent T5 rib on the right, possible metastatic disease. 4. Scattered 2-3 mm nodules bilaterally. Attention on follow-up is recommended. 5. Atelectasis or infiltrate in the right middle lobe. 6. Emphysema. 7. Aortic atherosclerosis and coronary artery calcifications. Electronically Signed   By: Thornell Sartorius M.D.   On: 04/02/2023 20:33   DG Chest 2 View  Result Date: 04/02/2023 CLINICAL DATA:  Shortness of breath.  Swelling to the right face. EXAM: CHEST - 2 VIEW COMPARISON:  01/23/2021 FINDINGS: Heart size and pulmonary vascularity are normal. Lungs are clear. No pleural effusion or pneumothorax. Degenerative changes in the spine. Severe degenerative change in the left shoulder. Calcification of the aorta. IMPRESSION: No active cardiopulmonary disease. Electronically Signed   By: Burman Nieves  M.D.   On: 04/02/2023 16:30     Subjective: Pt says she feels well to go home, agreeable to go home on oxygen, agreeable to follow up outpatient for PET scan, bronchoscopy as arranged by  pulmonary clinic.  No specific complaints.   Discharge Exam: Vitals:   04/04/23 0603 04/04/23 0717  BP:    Pulse:    Resp:    Temp:    SpO2: 93% 96%   Vitals:   04/03/23 2308 04/04/23 0405 04/04/23 0603 04/04/23 0717  BP: 127/74 (!) 159/76    Pulse: 87 81    Resp: 20 19    Temp: (!) 96.9 F (36.1 C) 98 F (36.7 C)    TempSrc:  Oral    SpO2: 96% (!) 85% 93% 96%  Weight:      Height:       General: Pt is alert, awake, not in acute distress Cardiovascular: normal S1/S2 +, no rubs, no gallops Respiratory: diffuse expiratory wheezing but no increased work of breathing Abdominal: Soft, NT, ND, bowel sounds + Extremities: no edema, no cyanosis   The results of significant diagnostics from this hospitalization (including imaging, microbiology, ancillary and laboratory) are listed below for reference.     Microbiology: No results found for this or any previous visit (from the past 240 hour(s)).   Labs: BNP (last 3 results) No results for input(s): "BNP" in the last 8760 hours. Basic Metabolic Panel: Recent Labs  Lab 04/02/23 1531 04/03/23 0532 04/04/23 0754  NA 134* 134* 136  K 3.2* 3.8 2.9*  CL 90* 96* 95*  CO2 28 24 28   GLUCOSE 94 126* 115*  BUN 17 9 8   CREATININE 0.90 0.52 0.53  CALCIUM 10.3 9.9 10.5*  MG  --  1.2* 1.7   Liver Function Tests: Recent Labs  Lab 04/02/23 1531  AST 23  ALT 21  ALKPHOS 68  BILITOT 0.8  PROT 7.1  ALBUMIN 4.0   No results for input(s): "LIPASE", "AMYLASE" in the last 168 hours. No results for input(s): "AMMONIA" in the last 168 hours. CBC: Recent Labs  Lab 04/02/23 1531 04/03/23 0644  WBC 14.4* 13.6*  NEUTROABS 11.7*  --   HGB 15.5* 14.4  HCT 45.4 42.9  MCV 97.4 98.2  PLT 294 248   Cardiac Enzymes: No results for input(s): "CKTOTAL", "CKMB", "CKMBINDEX", "TROPONINI" in the last 168 hours. BNP: Invalid input(s): "POCBNP" CBG: No results for input(s): "GLUCAP" in the last 168 hours. D-Dimer No results for  input(s): "DDIMER" in the last 72 hours. Hgb A1c No results for input(s): "HGBA1C" in the last 72 hours. Lipid Profile No results for input(s): "CHOL", "HDL", "LDLCALC", "TRIG", "CHOLHDL", "LDLDIRECT" in the last 72 hours. Thyroid function studies No results for input(s): "TSH", "T4TOTAL", "T3FREE", "THYROIDAB" in the last 72 hours.  Invalid input(s): "FREET3" Anemia work up No results for input(s): "VITAMINB12", "FOLATE", "FERRITIN", "TIBC", "IRON", "RETICCTPCT" in the last 72 hours. Urinalysis    Component Value Date/Time   COLORURINE YELLOW 04/02/2023 1924   APPEARANCEUR CLEAR 04/02/2023 1924   LABSPEC 1.013 04/02/2023 1924   PHURINE 6.0 04/02/2023 1924   GLUCOSEU NEGATIVE 04/02/2023 1924   HGBUR NEGATIVE 04/02/2023 1924   BILIRUBINUR NEGATIVE 04/02/2023 1924   KETONESUR 5 (A) 04/02/2023 1924   PROTEINUR NEGATIVE 04/02/2023 1924   UROBILINOGEN 4.0 (H) 11/21/2013 1350   NITRITE NEGATIVE 04/02/2023 1924   LEUKOCYTESUR NEGATIVE 04/02/2023 1924   Sepsis Labs Recent Labs  Lab 04/02/23 1531 04/03/23 0644  WBC 14.4* 13.6*  Microbiology No results found for this or any previous visit (from the past 240 hour(s)).  Time coordinating discharge: 51 mins   SIGNED:  Standley Dakins, MD  [facility] 04/04/2023, 12:24 PM How to contact the [facility] Attending or Consulting provider 7A - 7P or covering provider during after hours 7P -7A, for this patient?  Check the care team in [facility] and look for a) attending/consulting [facility] provider listed and b) the [facility] team listed Log into [email or web address] and use [facility] universal password to access. If you do not have the password, please contact the hospital operator. Locate the [facility] provider you are looking for under [facility] and page to a number that you can be directly reached. If you still have difficulty reaching the provider, please page the [facility] (Director on Call) for the Hospitalists listed on amion for assistance.

## 2023-04-04 NOTE — TOC Transition Note (Signed)
Transition of Care [facility]) - CM/SW Discharge Note   Patient Details  Name: Kathleen Bennett MRN: [id number] Date of Birth: Nov 09, 1960  Transition of [facility]) CM/SW Contact:  Annice Needy, LCSW Phone Number: 04/04/2023, 12:52 PM   Clinical Narrative:    Oxygen ordered via [employer]. Patient advised to call the number on the oxygen tank prior to discharge so that concentrator could be delivered to her home.  HHPT accepted by [facility].         Patient Goals and CMS Choice      Discharge Placement                         Discharge Plan and Services Additional resources added to the After Visit Summary for                  DME Arranged: Oxygen DME Agency: Patsy Lager Date DME Agency Contacted: 04/04/23 Time DME Agency Contacted: [phone] Representative spoke with at DME Agency: Ashly            Social Determinants of Health (SDOH) Interventions SDOH Screenings   Food Insecurity: Food Insecurity Present (04/03/2023)  Housing: Low Risk  (04/03/2023)  Transportation Needs: No Transportation Needs (04/03/2023)  Utilities: Not At Risk (04/03/2023)  Tobacco Use: High Risk (04/02/2023)     Readmission Risk Interventions     No data to display

## 2023-04-04 NOTE — Progress Notes (Signed)
Patient rested through the night, up to bathroom with assist during the night, patient remains on 5L O2 per nasal cannula, complaints of pain at bedtime, PRN tylenol effective. Patient lying in bed, call bell within reach.

## 2023-04-04 NOTE — Progress Notes (Signed)
SATURATION QUALIFICATIONS: (This note is used to comply with regulatory documentation for home oxygen)  Patient Saturations on Room Air at Rest = 88%  Patient Saturations on Room Air while Ambulating = 88%  Patient Saturations on 5 Liters of oxygen while Ambulating = 92%  Please briefly explain why patient needs home oxygen:

## 2023-04-04 NOTE — Discharge Instructions (Addendum)
PLEASE GO TO SCHEDULED PET SCAN AND BRONCHOSCOPY AS SCHEDULED BY PULMONARY OFFICE AS WORK UP FOR THE LUNG MASS   PLEASE ESTABLISH CARE WITH ONCOLOGIST DR. KATRAGADDA AFTER BRONCHOSCOPY COMPLETED   PLEASE GO BACK TO YOUR DENTIST AS SOON AS POSSIBLE TO HAVE  YOUR TEETH PULLED    IMPORTANT INFORMATION: PAY CLOSE ATTENTION   PHYSICIAN DISCHARGE INSTRUCTIONS  Follow with Primary care provider  Hyler, Janine Limbo, NP  and other consultants as instructed by your Hospitalist Physician  SEEK MEDICAL CARE OR RETURN TO EMERGENCY ROOM IF SYMPTOMS COME BACK, WORSEN OR NEW PROBLEM DEVELOPS   Please note: You were cared for by a hospitalist during your hospital stay. Every effort will be made to forward records to your primary care provider.  You can request that your primary care provider send for your hospital records if they have not received them.  Once you are discharged, your primary care physician will handle any further medical issues. Please note that NO REFILLS for any discharge medications will be authorized once you are discharged, as it is imperative that you return to your primary care physician (or establish a relationship with a primary care physician if you do not have one) for your post hospital discharge needs so that they can reassess your need for medications and monitor your lab values.  Please get a complete blood count and chemistry panel checked by your Primary MD at your next visit, and again as instructed by your Primary MD.  Get Medicines reviewed and adjusted: Please take all your medications with you for your next visit with your Primary MD  Laboratory/radiological data: Please request your Primary MD to go over all hospital tests and procedure/radiological results at the follow up, please ask your primary care provider to get all Hospital records sent to his/her office.  In some cases, they will be blood work, cultures and biopsy results pending at the time of your discharge.  Please request that your primary care provider follow up on these results.  If you are diabetic, please bring your blood sugar readings with you to your follow up appointment with primary care.    Please call and make your follow up appointments as soon as possible.    Also Note the following: If you experience worsening of your admission symptoms, develop shortness of breath, life threatening emergency, suicidal or homicidal thoughts you must seek medical attention immediately by calling 911 or calling your MD immediately  if symptoms less severe.  You must read complete instructions/literature along with all the possible adverse reactions/side effects for all the Medicines you take and that have been prescribed to you. Take any new Medicines after you have completely understood and accpet all the possible adverse reactions/side effects.   Do not drive when taking Pain medications or sleeping medications (Benzodiazepines)  Do not take more than prescribed Pain, Sleep and Anxiety Medications. It is not advisable to combine anxiety,sleep and pain medications without talking with your primary care practitioner  Special Instructions: If you have smoked or chewed Tobacco  in the last 2 yrs please stop smoking, stop any regular Alcohol  and or any Recreational drug use.  Wear Seat belts while driving.  Do not drive if taking any narcotic, mind altering or controlled substances or recreational drugs or alcohol.

## 2023-04-04 NOTE — Evaluation (Addendum)
Physical Therapy Evaluation Patient Details Name: Kathleen Bennett MRN: [id number] DOB: April 25, 1961 Today's Date: 04/04/2023  History of Present Illness  This is a 62 year old female presents to the hospital with shortness of breath.  Diagnosed with a COPD exacerbation CT imaging of the chest completed on presentation to the ER which reveals a large right upper lobe mass with some areas of pleural study concerning for advanced malignancy.  We talked today about moving forward with considerations of bronchoscopy and biopsy.  Patient has been put to sleep before.  She has poor dentition.  She is not on any blood thinners.  She does take a daily baby 81 mg aspirin.  Virtual video pulmonary consult visit.   Clinical Impression  Pt presented to therapy in bed receiving 5L of O2 via [place]. Pt states O2 is not used at baseline in home so O2 was removed and saturation was monitored throughout session. At rest on room air pt's SpO2 measured at 97%, 90% while ambulating with RW, and 89-90% seated post ambulation. Pt put back on 3L of O2 which increased saturation to 90%. Required stand by assist to transfer and ambulate for safety purposes. Patient discharged to care of nursing for ambulation daily as tolerated for length of stay.         If plan is discharge home, recommend the following: A little help with walking and/or transfers;A little help with bathing/dressing/bathroom;Assistance with cooking/housework;Assist for transportation;Help with stairs or ramp for entrance   Can travel by private vehicle        Equipment Recommendations None recommended by PT  Recommendations for Other Services       Functional Status Assessment Patient has had a recent decline in their functional status and demonstrates the ability to make significant improvements in function in a reasonable and predictable amount of time.     Precautions / Restrictions Precautions Precautions: Fall Restrictions Weight Bearing  Restrictions: No      Mobility  Bed Mobility Overal bed mobility: Needs Assistance Bed Mobility: Sidelying to Sit, Supine to Sit   Sidelying to sit: Supervision Supine to sit: Supervision     General bed mobility comments: Able to sit EOB independently, therapist supervision for saftey assurance    Transfers Overall transfer level: Needs assistance Equipment used: Rolling walker (2 wheels) Transfers: Sit to/from Stand, Bed to chair/wheelchair/BSC Sit to Stand: Supervision   Step pivot transfers: Supervision       General transfer comment: RW to assure steadiness, therapist supervision for saftey    Ambulation/Gait Ambulation/Gait assistance: Supervision Gait Distance (Feet): 100 Feet Assistive device: Rolling walker (2 wheels) Gait Pattern/deviations: Step-through pattern, Decreased step length - right, Decreased step length - left, Decreased stride length Gait velocity: slow but functional     General Gait Details: Slightly labored steps with RW  Stairs            Wheelchair Mobility     Tilt Bed    Modified Rankin (Stroke Patients Only)       Balance Overall balance assessment: Needs assistance Sitting-balance support: No upper extremity supported, Feet supported Sitting balance-Leahy Scale: Good Sitting balance - Comments: seated EOB   Standing balance support: During functional activity, Bilateral upper extremity supported Standing balance-Leahy Scale: Fair Standing balance comment: fair with use of RW                             Pertinent Vitals/Pain Pain Assessment Pain Assessment: Faces Faces  Pain Scale: Hurts little more Pain Location: R arm Pain Descriptors / Indicators: Discomfort, Aching, Grimacing Pain Intervention(s): Limited activity within patient's tolerance, Monitored during session    Home Living Family/patient expects to be discharged to:: Private residence Living Arrangements: Spouse/significant other;Other  (Comment) (Pt lives with ex husband)   Type of Home: Mobile home Home Access: Stairs to enter Entrance Stairs-Rails: Can reach both Entrance Stairs-Number of Steps: 5   Home Layout: One level Home Equipment: Agricultural consultant (2 wheels) Additional Comments: household ambulator with RW    Prior Function Prior Level of Function : Needs assist       Physical Assist : Mobility (physical);ADLs (physical) Mobility (physical): Gait;Stairs ADLs (physical): Bathing;Dressing Mobility Comments: No longer drives, uses RW to ambulate ADLs Comments: Pt states she is independent wtih most ADL's but ex husband is there to help if she were to need it     Extremity/Trunk Assessment                Communication   Communication Communication: No apparent difficulties Cueing Techniques: Verbal cues;Tactile cues  Cognition Arousal: Alert Behavior During Therapy: WFL for tasks assessed/performed Overall Cognitive Status: Within Functional Limits for tasks assessed                                          General Comments      Exercises     Assessment/Plan    PT Assessment All further PT needs can be met in the next venue of care  PT Problem List Decreased strength;Decreased activity tolerance;Decreased balance;Decreased mobility       PT Treatment Interventions DME instruction;Gait training;Stair training;Functional mobility training;Therapeutic activities;Therapeutic exercise;Balance training    PT Goals (Current goals can be found in the Care Plan section)  Acute Rehab PT Goals Patient Stated Goal: Return home with family to assist PT Goal Formulation: With patient Time For Goal Achievement: 04/18/23 Potential to Achieve Goals: Good    Frequency Min 3X/week     Co-evaluation               AM-PAC PT "6 Clicks" Mobility  Outcome Measure Help needed turning from your back to your side while in a flat bed without using bedrails?: None Help needed  moving from lying on your back to sitting on the side of a flat bed without using bedrails?: None Help needed moving to and from a bed to a chair (including a wheelchair)?: A Little Help needed standing up from a chair using your arms (e.g., wheelchair or bedside chair)?: A Little Help needed to walk in hospital room?: A Little Help needed climbing 3-5 steps with a railing? : A Little 6 Click Score: 20    End of Session   Activity Tolerance: Patient tolerated treatment well Patient left: in chair;with call bell/phone within reach Nurse Communication: Mobility status PT Visit Diagnosis: Unsteadiness on feet (R26.81);Other abnormalities of gait and mobility (R26.89);Muscle weakness (generalized) (M62.81);History of falling (Z91.81)    Time: 4098-1191 PT Time Calculation (min) (ACUTE ONLY): 27 min   Charges:   PT Evaluation $PT Eval Moderate Complexity: 1 Mod PT Treatments $Therapeutic Exercise: 23-37 mins PT General Charges $$ ACUTE PT VISIT: 1 Visit         SPT [place], DPT Program

## 2023-04-04 NOTE — Progress Notes (Signed)
Initial Nutrition Assessment  DOCUMENTATION CODES:   Not applicable  INTERVENTION:   -Ensure Enlive po TID, each supplement provides 350 kcal and 20 grams of protein -Magic cup TID with meals, each supplement provides 290 kcal and 9 grams of protein  -MVI with minerals daily -Continue with regular diet; could consider mechanically downgrading diet for ease of intake per pt preference  NUTRITION DIAGNOSIS:   Increased nutrient needs related to chronic illness (COPD) as evidenced by estimated needs.  GOAL:   Patient will meet greater than or equal to 90% of their needs  MONITOR:   PO intake, Supplement acceptance  REASON FOR ASSESSMENT:   Malnutrition Screening Tool    ASSESSMENT:   Pt with medical history significant for hypertension, COPD, and many dental issues who presented to the hospital because of mouth pain.  Pt admitted with lobulated mass on chest CT, hypoxemic respiratory failure, unintentional weight loss, and COPD exacerbation.   Reviewed I/O's: +3.1 L x 24 hours and +3.9 L since admission  Pt unavailable at time of visit. Attempted to speak with pt via call to hospital room phone, however, unable to reach. RD unable to obtain further nutrition-related history or complete nutrition-focused physical exam at this time.    Per H&P, pt has been followed outpatient by her dentist secondary to mouth pain; she is unable to eat or drink due to pain in her mouth. Per MD,  pt with severe oral infection, severe gingivitis, and dental caries. Plan to follow-up with dentist for extractions. Pt on oral augmentin and pain medications.   Per PCCM notes, imagining is concerning for advanced malignancy; plan for outpatient bronchoscopy, PET scan, and brain MRI. Plan outpatient oncology consult.   Pt currently on a regular diet. Noted meal completions 50%.   Reviewed wt hx; pt has experienced a 9.9% wt loss over the past 6 months. While this is not significant for time frame, it  is concerning given pt's multiple co-morbidities and suspicion for malignancy. Pt is at risk for malnutrition, however, unable to identify at this time. Pt would greatly benefit from addition of oral nutrition supplements.   Medications reviewed and include colace, prednisone, and dextrose 5% in lactated ringers infusion @ 125 ml/hr.   Labs reviewed: K: 2.9.    Diet Order:   Diet Order             Diet regular Room service appropriate? Yes; Fluid consistency: Thin  Diet effective now                   EDUCATION NEEDS:   No education needs have been identified at this time  Skin:  Skin Assessment: Reviewed RN Assessment  Last BM:  Unknown  Height:   Ht Readings from Last 1 Encounters:  04/02/23 5\' 3"  (1.6 m)    Weight:   Wt Readings from Last 1 Encounters:  04/02/23 53.5 kg    Ideal Body Weight:  52.3 kg  BMI:  Body mass index is 20.9 kg/m.  Estimated Nutritional Needs:   Kcal:  1700-1900  Protein:  90-105 grams  Fluid:  > 1.7 L    Levada Schilling, RD, LDN, CDCES Registered Dietitian II Certified Diabetes Care and Education Specialist Please refer to [facility] for RD and/or RD on-call/weekend/after hours pager

## 2023-04-11 ENCOUNTER — Encounter (HOSPITAL_COMMUNITY)
Admission: RE | Admit: 2023-04-11 | Discharge: 2023-04-11 | Disposition: A | Discharge status: Home / Self Care | Payer: Medicaid Other | Source: Ambulatory Visit | Attending: Pulmonary Disease | Admitting: Pulmonary Disease

## 2023-04-11 DIAGNOSIS — R918 Other nonspecific abnormal finding of lung field: Secondary | ICD-10-CM | POA: Insufficient documentation

## 2023-04-11 MED ORDER — FLUDEOXYGLUCOSE F - 18 (FDG) INJECTION
6.1800 | Freq: Once | INTRAVENOUS | Status: AC | PRN
  Administered 2023-04-11: 6.18 via INTRAVENOUS

## 2023-04-15 ENCOUNTER — Encounter (HOSPITAL_COMMUNITY): Payer: Self-pay | Admitting: Pulmonary Disease

## 2023-04-15 ENCOUNTER — Telehealth: Payer: Self-pay

## 2023-04-15 NOTE — Pre-Procedure Instructions (Signed)
Instructions Only  Anesthesia review: Y  Patient verbally denies any shortness of breath, fever, cough and chest pain during phone call   -------------  SDW INSTRUCTIONS given:  Your procedure is scheduled on Tuesday, August 27th.  Report to [facility] Main Entrance "A" at 0630 A.M., and check in at the Admitting office.  Call this number if you have problems the morning of surgery:  [phone]   Remember:  Do not eat or drink after midnight the night before your surgery    Take these medicines the morning of surgery with A SIP OF WATER  acidophilus (RISAQUAD)  baclofen  omeprazole (PRILOSEC)  PARoxetine (PAXIL)  SYMBICORT  verapamil (CALAN)  simethicone (MYLICON)-if needed VENTOLIN-if needed (Please bring on the day of surgery) albuterol nebulizer-if needed  As of today, STOP taking any Aspirin (unless otherwise instructed by your surgeon) Aleve, Naproxen, Ibuprofen, Motrin, Advil, Goody's, BC's, all herbal medications, fish oil, and all vitamins.                      Do not wear jewelry, make up, or nail polish            Do not wear lotions, powders, perfumes/colognes, or deodorant.            Do not shave 48 hours prior to surgery.  Men may shave face and neck.            Do not bring valuables to the hospital.            [facility] is not responsible for any belongings or valuables.  Do NOT Smoke (Tobacco/Vaping) 24 hours prior to your procedure If you use a CPAP at night, you may bring all equipment for your overnight stay.   Contacts, glasses, dentures or bridgework may not be worn into surgery.      For patients admitted to the hospital, discharge time will be determined by your treatment team.   Patients discharged the day of surgery will not be allowed to drive home, and someone needs to stay with them for 24 hours.    Special instructions:   [place]- Preparing For Surgery  Before surgery, you can play an important role. Because skin is not sterile,  your skin needs to be as free of germs as possible. You can reduce the number of germs on your skin by washing with CHG (chlorahexidine gluconate) Soap before surgery.  CHG is an antiseptic cleaner which kills germs and bonds with the skin to continue killing germs even after washing.    Oral Hygiene is also important to reduce your risk of infection.  Remember - BRUSH YOUR TEETH THE MORNING OF SURGERY WITH YOUR REGULAR TOOTHPASTE  Please do not use if you have an allergy to CHG or antibacterial soaps. If your skin becomes reddened/irritated stop using the CHG.  Do not shave (including legs and underarms) for at least 48 hours prior to first CHG shower. It is OK to shave your face.  Please follow these instructions carefully.   Shower the NIGHT BEFORE SURGERY and the MORNING OF SURGERY with DIAL Soap.   Pat yourself dry with a CLEAN TOWEL.  Wear CLEAN PAJAMAS to bed the night before surgery  Place CLEAN SHEETS on your bed the night of your first shower and DO NOT SLEEP WITH PETS.   Day of Surgery: Please shower morning of surgery  Wear Clean/Comfortable clothing the morning of surgery Do not apply any deodorants/lotions.  Remember to brush your teeth WITH YOUR REGULAR TOOTHPASTE.   Questions were answered. Patient verbalized understanding of instructions.

## 2023-04-15 NOTE — Anesthesia Preprocedure Evaluation (Signed)
Anesthesia Evaluation  Patient identified by MRN, date of birth, ID band Patient awake    Reviewed: Allergy & Precautions, NPO status , Patient's Chart, lab work & pertinent test results  History of Anesthesia Complications (+) PONV and history of anesthetic complications  Airway Mallampati: III  TM Distance: >3 FB Neck ROM: Full    Dental  (+) Dental Advisory Given, Poor Dentition, Missing   Pulmonary COPD (3L),  oxygen dependent, Current Smoker and Patient abstained from smoking., former smoker   Pulmonary exam normal breath sounds clear to auscultation       Cardiovascular hypertension, Pt. on medications Normal cardiovascular exam Rhythm:Regular Rate:Normal     Neuro/Psych  PSYCHIATRIC DISORDERS Anxiety     negative neurological ROS     GI/Hepatic Neg liver ROS,GERD  ,,  Endo/Other  negative endocrine ROS    Renal/GU negative Renal ROS     Musculoskeletal  (+) Arthritis ,    Abdominal   Peds  Hematology negative hematology ROS (+)   Anesthesia Other Findings Day of surgery medications reviewed with the patient.  Reproductive/Obstetrics                             Anesthesia Physical Anesthesia Plan  ASA: 4  Anesthesia Plan: General   Post-op Pain Management: Tylenol PO (pre-op)*   Induction: Intravenous  PONV Risk Score and Plan: 4 or greater and Dexamethasone and Ondansetron  Airway Management Planned: Oral ETT  Additional Equipment:   Intra-op Plan:   Post-operative Plan: Extubation in OR  Informed Consent: I have reviewed the patients History and Physical, chart, labs and discussed the procedure including the risks, benefits and alternatives for the proposed anesthesia with the patient or authorized representative who has indicated his/her understanding and acceptance.     Dental advisory given  Plan Discussed with: CRNA  Anesthesia Plan Comments: (PAT note  written 04/15/2023 by Shonna Chock, PA-C.  )       Anesthesia Quick Evaluation

## 2023-04-15 NOTE — Progress Notes (Signed)
Anesthesia Chart Review: SAME DAY WORK-UP  Case: [id number] Date/Time: 04/16/23 0900   Procedures:      ROBOTIC ASSISTED NAVIGATIONAL BRONCHOSCOPY (Bilateral)     VIDEO BRONCHOSCOPY WITH ENDOBRONCHIAL ULTRASOUND (Bilateral)   Anesthesia type: General   Diagnosis: Lung mass [R91.8]   Pre-op diagnosis: lung mass   [facility] 3 / [facility]   Surgeons: Josephine Igo, DO       DISCUSSION: Patient is a 62 year old female scheduled for the above procedure.   History includes smoking, post-operative N/V, COPD, HTN, GERD, hypercholesterolemia, murmur, diverticulitis (11/2013), hysterectomy.   Murmur is listed in her history. Several providers documented "no murmur" heard. No cardiac testing other than EKGs available for review. She is a same day work-up, so anesthesia team will have to further evaluate on the day of surgery.   [place] admission 04/02/23 - 04/04/23 for dyspnea and hypoxia (RA O2 sat 80's). She had been evaluated by an oral surgeon for dental problems and noted to have some swellin gin her left lower jaw with lymphadenopathy. She reported a productive cough for about one month. Also with some weight loss. She was placed on 3L O2. CTA Chest showed no PE but a lobulated mass in the RUL, focal pleural thickening RUL with questionable erosions adjacent to T5 right rib, scattered 2-3 mm nodules, RML atelectasis versus infiltrate, emphysema, aortic and coronary atherosclerosis. Findings concerning for pulmonary malignancy, so out-patient bronchoscopy planned. Treated as acute hypoxemic respiratory failure due to COPD exacerbation and RUL lung mass. Treated with 7 days prednisone, 7 days Augmentin, bronchodilators, home O2 up to 5L/[place]. Also advised ongoing dental follow-up for severe gingivitis, dental cares and need to teeth extractions.  She had and echo 04/02/23 on arrival to ED. Results reviewed--baseline wanderer may be affecting read. Would defer to surgeon and/or  anesthesia team if they preferred to repeat prior to surgery. CBC on 04/03/23 showing WBC 13.6, H/H 14.4/16.9, PLT 248, LFTs normal on 04/02/23. HIV non-reactive 04/03/23. 04/04/23 BMET showed K 2.9, glucose 115, Ca 10.5, Mg 1.7. Will tentatively enter order for ISTAT8 to recheck hypokalemia.      VS:  Wt Readings from Last 3 Encounters:  04/02/23 53.5 kg  10/10/21 59.4 kg  10/03/21 59.4 kg   Temp Readings from Last 3 Encounters:  04/04/23 36.7 C (Oral)  10/12/21 36.5 C (Oral)  10/10/21 36.6 C (Oral)   BP Readings from Last 3 Encounters:  04/04/23 (!) 159/76  10/12/21 129/79  10/10/21 125/64   Pulse Readings from Last 3 Encounters:  04/04/23 81  10/12/21 79  10/10/21 75     PROVIDERS: Hyler, Janine Limbo, NP is PCP   Richrd Sox, MD will be HEM-ONC. Contacted during 04/02/23 admission with recommendations of PET scan and out-patient follow-up once bronchoscopy results available. 04/11/23 PET scan results are still in process.  Audie Box, DO is pulmonologist   LABS: Most recent results in  [facility] include: Lab Results  Component Value Date   WBC 13.6 (H) 04/03/2023   HGB 14.4 04/03/2023   HCT 42.9 04/03/2023   PLT 248 04/03/2023   GLUCOSE 115 (H) 04/04/2023   ALT 21 04/02/2023   AST 23 04/02/2023   NA 136 04/04/2023   K 2.9 (L) 04/04/2023   CL 95 (L) 04/04/2023   CREATININE 0.53 04/04/2023   BUN 8 04/04/2023   CO2 28 04/04/2023   INR 0.9 11/02/2020     IMAGES: PET Scan 04/11/23: In process.  CTA Chest  04/02/23: IMPRESSION: 1. No evidence of pulmonary embolism. 2. Lobulated mass with surrounding satellite nodules in the right upper lobe measuring 3.9 x 6.7 cm. Consider one of the following in 3 months for both low-risk and high-risk individuals: (a) repeat chest CT, (b) follow-up PET-CT, or (c) tissue sampling. This recommendation follows the consensus statement: Guidelines for Management of Incidental Pulmonary Nodules Detected on CT Images: From the  Fleischner Society 2017; Radiology 2017; 284:228-243. 3. Focal pleural thickening in the right upper lobe with questionable erosions of the adjacent T5 rib on the right, possible metastatic disease. 4. Scattered 2-3 mm nodules bilaterally. Attention on follow-up is recommended. 5. Atelectasis or infiltrate in the right middle lobe. 6. Emphysema. 7. Aortic atherosclerosis and coronary artery calcifications.   EKG: 04/02/23: Sinus or ectopic atrial rhythm Short PR interval Posterior infarct, old Repol abnrm suggests ischemia, lateral leads Confirmed by Vanetta Mulders [phone]) on 04/02/2023 3:34:20 PM - Baseline wanderer may be affecting interpretation. Consider repeat tracing on the day of surgery if felt indicated.    CV: N/A  Past Medical History:  Diagnosis Date   Anxiety    COPD (chronic obstructive pulmonary disease) (HCC)    DDD (degenerative disc disease), lumbar    Diverticulitis    GERD (gastroesophageal reflux disease)    Heart murmur    High cholesterol    Hypertension    PONV (postoperative nausea and vomiting)     Past Surgical History:  Procedure Laterality Date   ABDOMINAL HYSTERECTOMY     BIOPSY  10/12/2021   Procedure: BIOPSY;  Surgeon: Lanelle Bal, DO;  Location: [facility] SU[facility];  Service: Endoscopy;;   COLONOSCOPY WITH PROPOFOL N/A 10/12/2021   Procedure: COLONOSCOPY WITH PROPOFOL;  Surgeon: Lanelle Bal, DO;  Location: [facility] SU[facility];  Service: Endoscopy;  Laterality: N/A;  1:30 / ASA 3    MEDICATIONS: No current facility-administered medications for this encounter.    acidophilus (RISAQUAD) CAPS capsule   albuterol (PROVENTIL HFA;VENTOLIN HFA) 108 (90 BASE) MCG/ACT inhaler   albuterol (PROVENTIL) (2.5 MG/3ML) 0.083% nebulizer solution   aspirin EC 81 MG tablet   Aspirin-Acetaminophen-Caffeine (GOODYS EXTRA STRENGTH) 500-325-65 MG PACK   baclofen (LIORESAL) 10 MG tablet   Cholecalciferol (VITAMIN D-3) 125 MCG (5000 UT) TABS   furosemide  (LASIX) 40 MG tablet   lovastatin (MEVACOR) 20 MG tablet   Multiple Vitamin (MULTIVITAMIN WITH MINERALS) TABS tablet   omeprazole (PRILOSEC) 20 MG capsule   PARoxetine (PAXIL) 20 MG tablet   potassium chloride (KLOR-CON) 10 MEQ tablet   simethicone (MYLICON) 125 MG chewable tablet   SYMBICORT 80-4.5 MCG/ACT inhaler   verapamil (CALAN) 120 MG tablet   vitamin B-12 (CYANOCOBALAMIN) 1000 MCG tablet   Defer perioperative ASA instructions to pulmonologst.    Shonna Chock, PA-C Surgical Short Stay/Anesthesiology [facility] Phone [phone] [facility] Phone [phone] 04/15/2023 10:22 AM

## 2023-04-15 NOTE — Telephone Encounter (Signed)
Called [facility] radiology to read pt's PET scan.

## 2023-04-16 ENCOUNTER — Other Ambulatory Visit: Payer: Self-pay

## 2023-04-16 ENCOUNTER — Encounter (HOSPITAL_COMMUNITY): Payer: Self-pay | Admitting: Pulmonary Disease

## 2023-04-16 ENCOUNTER — Ambulatory Visit (HOSPITAL_COMMUNITY): Payer: Medicaid Other

## 2023-04-16 ENCOUNTER — Ambulatory Visit (HOSPITAL_BASED_OUTPATIENT_CLINIC_OR_DEPARTMENT_OTHER): Payer: Medicaid Other | Admitting: Vascular Surgery

## 2023-04-16 ENCOUNTER — Ambulatory Visit (HOSPITAL_COMMUNITY): Payer: Medicaid Other | Admitting: Vascular Surgery

## 2023-04-16 ENCOUNTER — Ambulatory Visit (HOSPITAL_COMMUNITY)
Admission: RE | Admit: 2023-04-16 | Discharge: 2023-04-16 | Disposition: A | Discharge status: Home / Self Care | Payer: Medicaid Other | Attending: Pulmonary Disease | Admitting: Pulmonary Disease

## 2023-04-16 ENCOUNTER — Encounter (HOSPITAL_COMMUNITY)
Admission: RE | Disposition: A | Discharge status: Home / Self Care | Payer: Self-pay | Source: Home / Self Care | Attending: Pulmonary Disease

## 2023-04-16 DIAGNOSIS — J9621 Acute and chronic respiratory failure with hypoxia: Secondary | ICD-10-CM | POA: Insufficient documentation

## 2023-04-16 DIAGNOSIS — I2510 Atherosclerotic heart disease of native coronary artery without angina pectoris: Secondary | ICD-10-CM | POA: Insufficient documentation

## 2023-04-16 DIAGNOSIS — J9622 Acute and chronic respiratory failure with hypercapnia: Secondary | ICD-10-CM | POA: Insufficient documentation

## 2023-04-16 DIAGNOSIS — Z87891 Personal history of nicotine dependence: Secondary | ICD-10-CM

## 2023-04-16 DIAGNOSIS — Z7982 Long term (current) use of aspirin: Secondary | ICD-10-CM | POA: Insufficient documentation

## 2023-04-16 DIAGNOSIS — J440 Chronic obstructive pulmonary disease with acute lower respiratory infection: Secondary | ICD-10-CM

## 2023-04-16 DIAGNOSIS — I10 Essential (primary) hypertension: Secondary | ICD-10-CM

## 2023-04-16 DIAGNOSIS — F17210 Nicotine dependence, cigarettes, uncomplicated: Secondary | ICD-10-CM | POA: Insufficient documentation

## 2023-04-16 DIAGNOSIS — C969 Malignant neoplasm of lymphoid, hematopoietic and related tissue, unspecified: Secondary | ICD-10-CM | POA: Insufficient documentation

## 2023-04-16 DIAGNOSIS — K219 Gastro-esophageal reflux disease without esophagitis: Secondary | ICD-10-CM | POA: Diagnosis not present

## 2023-04-16 DIAGNOSIS — R918 Other nonspecific abnormal finding of lung field: Secondary | ICD-10-CM | POA: Insufficient documentation

## 2023-04-16 DIAGNOSIS — J439 Emphysema, unspecified: Secondary | ICD-10-CM | POA: Diagnosis not present

## 2023-04-16 DIAGNOSIS — Z7951 Long term (current) use of inhaled steroids: Secondary | ICD-10-CM | POA: Insufficient documentation

## 2023-04-16 DIAGNOSIS — I700 Atherosclerosis of aorta: Secondary | ICD-10-CM | POA: Insufficient documentation

## 2023-04-16 DIAGNOSIS — E876 Hypokalemia: Secondary | ICD-10-CM

## 2023-04-16 HISTORY — PX: VIDEO BRONCHOSCOPY WITH ENDOBRONCHIAL ULTRASOUND: SHX6177

## 2023-04-16 HISTORY — DX: Dependence on supplemental oxygen: Z99.81

## 2023-04-16 HISTORY — DX: Personal history of pneumonia (recurrent): Z87.01

## 2023-04-16 HISTORY — PX: BRONCHIAL NEEDLE ASPIRATION BIOPSY: SHX5106

## 2023-04-16 HISTORY — DX: Depression, unspecified: F32.A

## 2023-04-16 LAB — POCT I-STAT, CHEM 8
BUN: 24 mg/dL — ABNORMAL HIGH (ref 8–23)
BUN: 22 mg/dL (ref 8–23)
Calcium, Ion: 1.53 mmol/L (ref 1.15–1.40)
Calcium, Ion: 1.62 mmol/L (ref 1.15–1.40)
Chloride: 89 mmol/L — ABNORMAL LOW (ref 98–111)
Chloride: 93 mmol/L — ABNORMAL LOW (ref 98–111)
Creatinine, Ser: 1 mg/dL (ref 0.44–1.00)
Creatinine, Ser: 1 mg/dL (ref 0.44–1.00)
Glucose, Bld: 91 mg/dL (ref 70–99)
Glucose, Bld: 91 mg/dL (ref 70–99)
HCT: 48 % — ABNORMAL HIGH (ref 36.0–46.0)
HCT: 42 % (ref 36.0–46.0)
Hemoglobin: 16.3 g/dL — ABNORMAL HIGH (ref 12.0–15.0)
Hemoglobin: 14.3 g/dL (ref 12.0–15.0)
Potassium: 2.7 mmol/L — CL (ref 3.5–5.1)
Potassium: 5.4 mmol/L — ABNORMAL HIGH (ref 3.5–5.1)
Sodium: 133 mmol/L — ABNORMAL LOW (ref 135–145)
Sodium: 132 mmol/L — ABNORMAL LOW (ref 135–145)
TCO2: 36 mmol/L — ABNORMAL HIGH (ref 22–32)
TCO2: 37 mmol/L — ABNORMAL HIGH (ref 22–32)

## 2023-04-16 SURGERY — BRONCHOSCOPY, WITH EBUS
Anesthesia: General | Laterality: Bilateral

## 2023-04-16 MED ORDER — DEXAMETHASONE SODIUM PHOSPHATE 10 MG/ML IJ SOLN
INTRAMUSCULAR | Status: DC | PRN
  Administered 2023-04-16: 10 mg via INTRAVENOUS

## 2023-04-16 MED ORDER — PHENYLEPHRINE 80 MCG/ML (10ML) SYRINGE FOR IV PUSH (FOR BLOOD PRESSURE SUPPORT)
PREFILLED_SYRINGE | INTRAVENOUS | Status: DC | PRN
  Administered 2023-04-16: 160 ug via INTRAVENOUS

## 2023-04-16 MED ORDER — ACETAMINOPHEN 500 MG PO TABS
1000.0000 mg | ORAL_TABLET | Freq: Once | ORAL | Status: AC
  Administered 2023-04-16: 1000 mg via ORAL
  Filled 2023-04-16: qty 2

## 2023-04-16 MED ORDER — CHLORHEXIDINE GLUCONATE 0.12 % MT SOLN
15.0000 mL | Freq: Once | OROMUCOSAL | Status: AC
  Administered 2023-04-16: 15 mL via OROMUCOSAL
  Filled 2023-04-16: qty 15

## 2023-04-16 MED ORDER — ROCURONIUM BROMIDE 10 MG/ML (PF) SYRINGE
PREFILLED_SYRINGE | INTRAVENOUS | Status: DC | PRN
  Administered 2023-04-16: 40 mg via INTRAVENOUS

## 2023-04-16 MED ORDER — LIDOCAINE 2% (20 MG/ML) 5 ML SYRINGE
INTRAMUSCULAR | Status: DC | PRN
  Administered 2023-04-16: 60 mg via INTRAVENOUS

## 2023-04-16 MED ORDER — FENTANYL CITRATE (PF) 100 MCG/2ML IJ SOLN
INTRAMUSCULAR | Status: DC | PRN
  Administered 2023-04-16: 50 ug via INTRAVENOUS

## 2023-04-16 MED ORDER — PROPOFOL 500 MG/50ML IV EMUL
INTRAVENOUS | Status: DC | PRN
  Administered 2023-04-16: 75 ug/kg/min via INTRAVENOUS

## 2023-04-16 MED ORDER — FENTANYL CITRATE (PF) 250 MCG/5ML IJ SOLN: INTRAMUSCULAR | Status: DC | PRN

## 2023-04-16 MED ORDER — POTASSIUM CHLORIDE 10 MEQ/100ML IV SOLN
10.0000 meq | Freq: Once | INTRAVENOUS | Status: AC
  Administered 2023-04-16: 10 meq via INTRAVENOUS
  Filled 2023-04-16: qty 100

## 2023-04-16 MED ORDER — ONDANSETRON HCL 4 MG/2ML IJ SOLN
INTRAMUSCULAR | Status: DC | PRN
Start: 2023-04-16 — End: 2023-04-16
  Administered 2023-04-16: 4 mg via INTRAVENOUS

## 2023-04-16 MED ORDER — SUGAMMADEX SODIUM 200 MG/2ML IV SOLN
INTRAVENOUS | Status: DC | PRN
  Administered 2023-04-16: 100 mg via INTRAVENOUS

## 2023-04-16 MED ORDER — LACTATED RINGERS IV SOLN: INTRAVENOUS | Status: DC

## 2023-04-16 MED ORDER — PROPOFOL 10 MG/ML IV BOLUS
INTRAVENOUS | Status: DC | PRN
  Administered 2023-04-16: 90 mg via INTRAVENOUS

## 2023-04-16 MED ORDER — POTASSIUM CHLORIDE 20 MEQ PO PACK
40.0000 meq | PACK | Freq: Once | ORAL | Status: AC
  Administered 2023-04-16: 40 meq via ORAL
  Filled 2023-04-16 (×2): qty 2

## 2023-04-16 MED ORDER — PHENYLEPHRINE HCL-NACL 20-0.9 MG/250ML-% IV SOLN
INTRAVENOUS | Status: DC | PRN
  Administered 2023-04-16: 60 ug/min via INTRAVENOUS

## 2023-04-16 SURGICAL SUPPLY — 29 items
BRUSH CYTOL CELLEBRITY 1.5X140 (MISCELLANEOUS) IMPLANT
CANISTER SUCT 3000ML PPV (MISCELLANEOUS) ×2 IMPLANT
CONT SPEC 4OZ CLIKSEAL STRL BL (MISCELLANEOUS) ×2 IMPLANT
COVER BACK TABLE 60X90IN (DRAPES) ×2 IMPLANT
COVER DOME SNAP 22 D (MISCELLANEOUS) ×2 IMPLANT
FORCEPS BIOP RJ4 1.8 (CUTTING FORCEPS) IMPLANT
GAUZE SPONGE 4X4 12PLY STRL (GAUZE/BANDAGES/DRESSINGS) ×2 IMPLANT
GLOVE BIO SURGEON STRL SZ7.5 (GLOVE) ×2 IMPLANT
GOWN STRL REUS W/ TWL LRG LVL3 (GOWN DISPOSABLE) ×2 IMPLANT
GOWN STRL REUS W/TWL LRG LVL3 (GOWN DISPOSABLE) ×2
KIT CLEAN ENDO COMPLIANCE (KITS) ×4 IMPLANT
KIT TURNOVER KIT B (KITS) ×2 IMPLANT
MARKER SKIN DUAL TIP RULER LAB (MISCELLANEOUS) ×2 IMPLANT
NDL EBUS SONO TIP PENTAX (NEEDLE) ×2 IMPLANT
NEEDLE EBUS SONO TIP PENTAX (NEEDLE) ×2
NS IRRIG 1000ML POUR BTL (IV SOLUTION) ×2 IMPLANT
OIL SILICONE PENTAX (PARTS (SERVICE/REPAIRS)) ×2 IMPLANT
PAD ARMBOARD 7.5X6 YLW CONV (MISCELLANEOUS) ×4 IMPLANT
SOL ANTI FOG 6CC (MISCELLANEOUS) ×2 IMPLANT
SYR 20CC LL (SYRINGE) ×4 IMPLANT
SYR 20ML ECCENTRIC (SYRINGE) ×4 IMPLANT
SYR 50ML SLIP (SYRINGE) IMPLANT
SYR 5ML LUER SLIP (SYRINGE) ×2 IMPLANT
TOWEL OR 17X24 6PK STRL BLUE (TOWEL DISPOSABLE) ×2 IMPLANT
TRAP SPECIMEN MUCOUS 40CC (MISCELLANEOUS) IMPLANT
TUBE CONNECTING 20X1/4 (TUBING) ×4 IMPLANT
UNDERPAD 30X30 (UNDERPADS AND DIAPERS) ×2 IMPLANT
VALVE DISPOSABLE (MISCELLANEOUS) ×2 IMPLANT
WATER STERILE IRR 1000ML POUR (IV SOLUTION) ×2 IMPLANT

## 2023-04-16 NOTE — Progress Notes (Signed)
Reviewed prior to bronchoscopy. Will need to see [facility] ASAP  Thanks,  BLI  Josephine Igo, DO [facility] 04/16/2023 4:34 PM

## 2023-04-16 NOTE — Transfer of Care (Signed)
Immediate Anesthesia Transfer of Care Note  Patient: Kathleen Bennett  Procedure(s) Performed: VIDEO BRONCHOSCOPY WITH ENDOBRONCHIAL ULTRASOUND (Bilateral) BRONCHIAL NEEDLE ASPIRATION BIOPSIES  Patient Location: PACU  Anesthesia Type:General  Level of Consciousness: drowsy and patient cooperative  Airway & Oxygen Therapy: Patient Spontanous Breathing and Patient connected to nasal cannula oxygen  Post-op Assessment: Report given to RN, Post -op Vital signs reviewed and stable, and Patient moving all extremities X 4  Post vital signs: Reviewed and stable  Last Vitals:  Vitals Value Taken Time  BP 172/93 04/16/23 1007  Temp 36.3 C 04/16/23 1007  Pulse 87 04/16/23 1010  Resp 24 04/16/23 1010  SpO2 99 % 04/16/23 1010  Vitals shown include unfiled device data.  Last Pain:  Vitals:   04/16/23 0709  TempSrc:   PainSc: 0-No pain         Complications: No notable events documented.

## 2023-04-16 NOTE — Interval H&P Note (Signed)
History and Physical Interval Note:  04/16/2023 7:34 AM  Kathleen Bennett  has presented today for surgery, with the diagnosis of lung mass.  The various methods of treatment have been discussed with the patient and family. After consideration of risks, benefits and other options for treatment, the patient has consented to  Procedure(s): ROBOTIC ASSISTED NAVIGATIONAL BRONCHOSCOPY (Bilateral) VIDEO BRONCHOSCOPY WITH ENDOBRONCHIAL ULTRASOUND (Bilateral) as a surgical intervention.  The patient's history has been reviewed, patient examined, no change in status, stable for surgery.  I have reviewed the patient's chart and labs.  Questions were answered to the patient's satisfaction.     [name]

## 2023-04-16 NOTE — Op Note (Signed)
Video Bronchoscopy with Endobronchial Ultrasound Procedure Note  Date of Operation: 04/16/2023  Pre-op Diagnosis: adenopathy, bone mets, lung mass   Post-op Diagnosis: adenopathy, bone mets, lung mass   Surgeon: Josephine Igo, DO  Assistants: None   Anesthesia: General endotracheal anesthesia  Operation: Flexible video fiberoptic bronchoscopy with endobronchial ultrasound and biopsies.  Estimated Blood Loss: Minimal  Complications: None   Indications and History: Kathleen Bennett is a 62 y.o. female with adenopathy, bone mets, lung mass.  The risks, benefits, complications, treatment options and expected outcomes were discussed with the patient.  The possibilities of pneumothorax, pneumonia, reaction to medication, pulmonary aspiration, perforation of a viscus, bleeding, failure to diagnose a condition and creating a complication requiring transfusion or operation were discussed with the patient who freely signed the consent.    Description of Procedure: The patient was examined in the preoperative area and history and data from the preprocedure consultation were reviewed. It was deemed appropriate to proceed.  The patient was taken to [facility] endo, identified as Kathleen Bennett and the procedure verified as Flexible Video Fiberoptic Bronchoscopy.  A Time Out was held and the above information confirmed. After being taken to the operating room general anesthesia was initiated and the patient  was orally intubated. The video fiberoptic bronchoscope was introduced via the endotracheal tube and a general inspection was performed which showed normal airways. The standard scope was then withdrawn and the endobronchial ultrasound was used to identify and characterize the peritracheal, hilar and bronchial lymph nodes. Inspection showed enlarged right hilar adenopathy. Using real-time ultrasound guidance Wang needle biopsies were take from Station 10R nodes and were sent for cytology. The patient  tolerated the procedure well without apparent complications. There was no significant blood loss. The bronchoscope was withdrawn. Anesthesia was reversed and the patient was taken to the PACU for recovery.   Samples: 1. Wang needle biopsies from 10R node  Plans:  The patient will be discharged from the PACU to home when recovered from anesthesia. We will review the cytology, pathology results with the patient when they become available. Outpatient followup will be with Josephine Igo, DO  Josephine Igo, DO [facility] 04/16/2023 10:05 AM

## 2023-04-16 NOTE — Anesthesia Procedure Notes (Signed)
Procedure Name: Intubation Date/Time: 04/16/2023 9:09 AM  Performed by: Owens Loffler, RNPre-anesthesia Checklist: Patient identified, Emergency Drugs available, Suction available and Patient being monitored Patient Re-evaluated:Patient Re-evaluated prior to induction Oxygen Delivery Method: Circle system utilized Preoxygenation: Pre-oxygenation with 100% oxygen Induction Type: IV induction Ventilation: Mask ventilation without difficulty Laryngoscope Size: Mac and 3 Grade View: Grade I Tube type: Oral Tube size: 8.5 mm Number of attempts: 1 Airway Equipment and Method: Stylet and Oral airway Placement Confirmation: ETT inserted through vocal cords under direct vision, positive ETCO2 and breath sounds checked- equal and bilateral Secured at: 22 cm Tube secured with: Tape Dental Injury: Teeth and Oropharynx as per pre-operative assessment

## 2023-04-16 NOTE — Progress Notes (Signed)
CRITICAL RESULT PROVIDER NOTIFICATION  Test performed and critical result:  Istat 8 Potassium 2.7 Calcium ion 1.53  Date and time result received:  04/16/23 07:38  Provider name/title: Arrie Aran, MD  Date and time provider notified: 04/16/23 07:34  Date and time provider responded: 04/16/23 07:34  Provider response:No new orders

## 2023-04-16 NOTE — Discharge Instructions (Signed)
Flexible Bronchoscopy, Care After This sheet gives you information about how to care for yourself after your test. Your doctor may also give you more specific instructions. If you have problems or questions, contact your doctor. Follow these instructions at home: Eating and drinking Do not eat or drink anything (not even water) for 2 hours after your test, or until your numbing medicine (local anesthetic) wears off. When your numbness is gone and your cough and gag reflexes have come back, you may: Eat only soft foods. Slowly drink liquids. The day after the test, go back to your normal diet. Driving Do not drive for 24 hours if you were given a medicine to help you relax (sedative). Do not drive or use heavy machinery while taking prescription pain medicine. General instructions  Take over-the-counter and prescription medicines only as told by your doctor. Return to your normal activities as told. Ask what activities are safe for you. Do not use any products that have nicotine or tobacco in them. This includes cigarettes and e-cigarettes. If you need help quitting, ask your doctor. Keep all follow-up visits as told by your doctor. This is important. It is very important if you had a tissue sample (biopsy) taken. Get help right away if: You have shortness of breath that gets worse. You get light-headed. You feel like you are going to pass out (faint). You have chest pain. You cough up: More than a little blood. More blood than before. Summary Do not eat or drink anything (not even water) for 2 hours after your test, or until your numbing medicine wears off. Do not use cigarettes. Do not use e-cigarettes. Get help right away if you have chest pain.  This information is not intended to replace advice given to you by your health care provider. Make sure you discuss any questions you have with your health care provider. Document Released: 06/03/2009 Document Revised: 07/19/2017 Document  Reviewed: 08/24/2016 Elsevier Patient Education  2020 Elsevier Inc.  

## 2023-04-17 NOTE — Progress Notes (Signed)
[facility] [address], [place] [address]   Clinic Day:  04/17/2023  Referring physician: Juel Burrow, NP  Patient Care Team: Hyler, Janine Limbo, NP as PCP - General (Nephrology)   ASSESSMENT & PLAN:   Assessment: ***  Plan: ***  No orders of the defined types were placed in this encounter.     Alben Deeds Teague,acting as a Neurosurgeon for Doreatha Massed, MD.,have documented all relevant documentation on the behalf of Doreatha Massed, MD,as directed by  Doreatha Massed, MD while in the presence of Doreatha Massed, MD.   ***  [place] R Teague   8/28/20248:38 PM  CHIEF COMPLAINT/PURPOSE OF CONSULT:   Diagnosis: ***  Cancer Staging  No matching staging information was found for the patient.    Prior Therapy: ***  Current Therapy:  ***   HISTORY OF PRESENT ILLNESS:   Oncology History   No history exists.      Kathleen Bennett is a 62 y.o. female presenting to clinic today for evaluation of a lung mass at the request of Hyler, Janine Limbo, NP.  Patient was admitted to the ED on 04/02/23 for acute hypoxemic respiratory failure. She was put on 3 L of O2.  CT of the chest found: a lobulated mass with surrounding satellite nodules in the right upper lobe measuring 3.9 x 6.7 cm and focal pleural thickening in the right upper lobe with questionable erosions of the adjacent T5 rib on the right, possible for metastatic disease. She then had a bronchoscopy with endobronchial [place] on 04/16/23 with Dr. Tonia Brooms. Cytology results are pending. She had a PET on 04/11/23 that found: hypermetabolic irregular solid right upper lobe 6.0 cm lung mass with numerous surrounding satellite pulmonary nodules, compatible with primary bronchogenic malignancy; hypermetabolic solid peripheral right upper lobe subpleural 1.7 cm pulmonary nodule, compatible with intralobar pulmonary metastasis; hypermetabolic metastatic right hilar adenopathy; low level hypermetabolism within a  small 0.7 cm low right paratracheal lymph node, suspicious for ipsilateral mediastinal nodal metastasis; widespread hypermetabolic osseous metastases scattered throughout the axial skeleton; and tiny focus of hypermetabolism in the far inferior right liver without discrete correlate on the noncontrast CT images, indeterminate for early liver metastasis versus artifact.   Today, she states that she is doing well overall. Her appetite level is at ***%. Her energy level is at ***%.  PAST MEDICAL HISTORY:   Past Medical History: Past Medical History:  Diagnosis Date   Anxiety    COPD (chronic obstructive pulmonary disease) (HCC)    DDD (degenerative disc disease), lumbar    Depression    Diverticulitis    GERD (gastroesophageal reflux disease)    Heart murmur    High cholesterol    History of pneumonia as a child    Hypertension    Oxygen dependent    3L [place]   PONV (postoperative nausea and vomiting)     Surgical History: Past Surgical History:  Procedure Laterality Date   ABDOMINAL HYSTERECTOMY     BIOPSY  10/12/2021   Procedure: BIOPSY;  Surgeon: Lanelle Bal, DO;  Location: [facility];  Service: Endoscopy;;   COLONOSCOPY WITH PROPOFOL N/A 10/12/2021   Procedure: COLONOSCOPY WITH PROPOFOL;  Surgeon: Lanelle Bal, DO;  Location: [facility];  Service: Endoscopy;  Laterality: N/A;  1:30 / ASA 3    Social History: Social History   Socioeconomic History   Marital status: Married    Spouse name: Not on file   Number of children: Not  on file   Years of education: Not on file   Highest education level: Not on file  Occupational History   Not on file  Tobacco Use   Smoking status: Former    Current packs/day: 1.50    Average packs/day: 1.5 packs/day for 40.0 years (60.0 ttl pk-yrs)    Types: Cigarettes   Smokeless tobacco: Never  Vaping Use   Vaping status: Never Used  Substance and Sexual Activity   Alcohol use: Yes    Comment: beer daily x 6   Drug use: No    Sexual activity: Not on file  Other Topics Concern   Not on file  Social History Narrative   Not on file   Social Determinants of Health   Financial Resource Strain: Not on file  Food Insecurity: Food Insecurity Present (04/03/2023)   Hunger Vital Sign    Worried About Running Out of Food in the Last Year: Often true    Ran Out of Food in the Last Year: Never true  Transportation Needs: No Transportation Needs (04/03/2023)   PRAPARE - Administrator, Civil Service (Medical): No    Lack of Transportation (Non-Medical): No  Physical Activity: Not on file  Stress: Not on file  Social Connections: Not on file  Intimate Partner Violence: Not At Risk (04/03/2023)   Humiliation, Afraid, Rape, and Kick questionnaire    Fear of Current or Ex-Partner: No    Emotionally Abused: No    Physically Abused: No    Sexually Abused: No    Family History: No family history on file.  Current Medications:  Current Outpatient Medications:    acidophilus (RISAQUAD) CAPS capsule, Take 2 capsules by mouth daily for 14 days., Disp: 28 capsule, Rfl: 0   albuterol (PROVENTIL HFA;VENTOLIN HFA) 108 (90 BASE) MCG/ACT inhaler, Inhale 2 puffs into the lungs every 6 (six) hours as needed for shortness of breath or wheezing., Disp: , Rfl:    albuterol (PROVENTIL) (2.5 MG/3ML) 0.083% nebulizer solution, Take 2.5 mg by nebulization every 6 (six) hours as needed for wheezing or shortness of breath., Disp: , Rfl:    aspirin EC 81 MG tablet, Take 81 mg by mouth daily. Swallow whole., Disp: , Rfl:    Aspirin-Acetaminophen-Caffeine (GOODYS EXTRA STRENGTH) 500-325-65 MG PACK, Take 1 packet by mouth 2 (two) times daily as needed (pain.)., Disp: , Rfl:    baclofen (LIORESAL) 10 MG tablet, Take 10 mg by mouth every 8 (eight) hours as needed., Disp: , Rfl:    Cholecalciferol (VITAMIN D-3) 125 MCG (5000 UT) TABS, Take 5,000 Units by mouth in the morning., Disp: , Rfl:    furosemide (LASIX) 40 MG tablet, Take 20  mg by mouth daily as needed for fluid or edema., Disp: , Rfl:    lovastatin (MEVACOR) 20 MG tablet, Take 20 mg by mouth every morning., Disp: , Rfl:    Multiple Vitamin (MULTIVITAMIN WITH MINERALS) TABS tablet, Take 1 tablet by mouth daily., Disp: , Rfl:    omeprazole (PRILOSEC) 20 MG capsule, Take 20 mg by mouth daily before breakfast., Disp: , Rfl:    PARoxetine (PAXIL) 20 MG tablet, Take 20 mg by mouth in the morning., Disp: , Rfl:    potassium chloride (KLOR-CON) 10 MEQ tablet, Take 10 mEq by mouth every morning., Disp: , Rfl:    simethicone (MYLICON) 125 MG chewable tablet, Chew 125 mg by mouth every 6 (six) hours as needed for flatulence., Disp: , Rfl:    SYMBICORT 80-4.5 MCG/ACT  inhaler, Inhale 2 puffs into the lungs in the morning and at bedtime., Disp: , Rfl:    verapamil (CALAN) 120 MG tablet, Take 240 mg by mouth 2 (two) times daily., Disp: , Rfl:    vitamin B-12 (CYANOCOBALAMIN) 1000 MCG tablet, Take 1,000 mcg by mouth in the morning., Disp: , Rfl:    Allergies: Allergies  Allergen Reactions   Codeine Itching, Other (See Comments) and Rash    REACTION: Severe itching, feels like skin is crawling    REVIEW OF SYSTEMS:   Review of Systems  Constitutional:  Negative for chills, fatigue and fever.  HENT:   Negative for lump/mass, mouth sores, nosebleeds, sore throat and trouble swallowing.   Eyes:  Negative for eye problems.  Respiratory:  Negative for cough and shortness of breath.   Cardiovascular:  Negative for chest pain, leg swelling and palpitations.  Gastrointestinal:  Negative for abdominal pain, constipation, diarrhea, nausea and vomiting.  Genitourinary:  Negative for bladder incontinence, difficulty urinating, dysuria, frequency, hematuria and nocturia.   Musculoskeletal:  Negative for arthralgias, back pain, flank pain, myalgias and neck pain.  Skin:  Negative for itching and rash.  Neurological:  Negative for dizziness, headaches and numbness.  Hematological:  Does  not bruise/bleed easily.  Psychiatric/Behavioral:  Negative for depression, sleep disturbance and suicidal ideas. The patient is not nervous/anxious.   All other systems reviewed and are negative.    VITALS:   There were no vitals taken for this visit.  Wt Readings from Last 3 Encounters:  04/16/23 105 lb (47.6 kg)  04/02/23 118 lb (53.5 kg)  10/10/21 131 lb (59.4 kg)    There is no height or weight on file to calculate BMI.  Performance status (ECOG): {CHL ONC [id number]  PHYSICAL EXAM:   Physical Exam Vitals and nursing note reviewed. Exam conducted with a chaperone present.  Constitutional:      Appearance: Normal appearance.  Cardiovascular:     Rate and Rhythm: Normal rate and regular rhythm.     Pulses: Normal pulses.     Heart sounds: Normal heart sounds.  Pulmonary:     Effort: Pulmonary effort is normal.     Breath sounds: Normal breath sounds.  Abdominal:     Palpations: Abdomen is soft. There is no hepatomegaly, splenomegaly or mass.     Tenderness: There is no abdominal tenderness.  Musculoskeletal:     Right lower leg: No edema.     Left lower leg: No edema.  Lymphadenopathy:     Cervical: No cervical adenopathy.     Right cervical: No superficial, deep or posterior cervical adenopathy.    Left cervical: No superficial, deep or posterior cervical adenopathy.     Upper Body:     Right upper body: No supraclavicular or axillary adenopathy.     Left upper body: No supraclavicular or axillary adenopathy.  Neurological:     General: No focal deficit present.     Mental Status: She is alert and oriented to person, place, and time.  Psychiatric:        Mood and Affect: Mood normal.        Behavior: Behavior normal.     LABS:      Latest Ref Rng & Units 04/16/2023   10:36 AM 04/16/2023    7:38 AM 04/03/2023    6:44 AM  CBC  WBC 4.0 - 10.5 K/uL   13.6   Hemoglobin 12.0 - 15.0 g/dL 56.3  87.5  64.3   Hematocrit 36.0 -  46.0 % 42.0  48.0  42.9    Platelets 150 - 400 K/uL   248       Latest Ref Rng & Units 04/16/2023   10:36 AM 04/16/2023    7:38 AM 04/04/2023    7:54 AM  CMP  Glucose 70 - 99 mg/dL 91  91  161   BUN 8 - 23 mg/dL 22  24  8    Creatinine 0.44 - 1.00 mg/dL 0.96  0.45  4.09   Sodium 135 - 145 mmol/L 132  133  136   Potassium 3.5 - 5.1 mmol/L 5.4  2.7  2.9   Chloride 98 - 111 mmol/L 93  89  95   CO2 22 - 32 mmol/L   28   Calcium 8.9 - 10.3 mg/dL   81.1      No results found for: "CEA1", "CEA" / No results found for: "CEA1", "CEA" No results found for: "PSA1" No results found for: "BJY782" No results found for: "CAN125"  No results found for: "TOTALPROTELP", "ALBUMINELP", "A1GS", "A2GS", "BETS", "BETA2SER", "GAMS", "MSPIKE", "SPEI" No results found for: "TIBC", "FERRITIN", "IRONPCTSAT" No results found for: "LDH"   STUDIES:   NM PET Image Initial (PI) Skull Base To Thigh (F-18 FDG)  Result Date: 04/15/2023 CLINICAL DATA:  Initial treatment strategy for right upper lobe lung mass. EXAM: NUCLEAR MEDICINE PET SKULL BASE TO THIGH TECHNIQUE: 6.2 mCi F-18 FDG was injected intravenously. Full-ring PET imaging was performed from the skull base to thigh after the radiotracer. CT data was obtained and used for attenuation correction and anatomic localization. Fasting blood glucose: 143 mg/dl COMPARISON:  95/62/1308 chest CT angiogram FINDINGS: Mediastinal blood pool activity: SUV max 2.1 Liver activity: SUV max NA NECK: No hypermetabolic lymph nodes in the neck. Incidental CT findings: None. CHEST: Hypermetabolic irregular solid right upper lobe 6.0 x 3.2 cm lung mass with max SUV 10.1 (series 203/image 16) with numerous surrounding satellite pulmonary nodules up to 0.8 cm in the medial right upper lobe (series 203/image 14), not appreciably changed from 04/02/2023 chest CT angiogram study. Hypermetabolic solid peripheral right upper lobe subpleural 1.7 cm pulmonary nodule with max SUV 4.5 (series 203/image 22), stable from  recent CTA. No hypermetabolic left lung findings. Enlarged hypermetabolic 2.6 cm short axis diameter right hilar lymph node with max SUV 9.0. No enlarged or hypermetabolic left hilar nodes. Low right paratracheal small 0.7 cm node demonstrates low level hypermetabolism with max SUV 2.3 (series 203/image 21). No additional enlarged or hypermetabolic mediastinal nodes. No enlarged or hypermetabolic axillary nodes. Incidental CT findings: Moderate centrilobular emphysema. Coronary atherosclerosis. Atherosclerotic nonaneurysmal thoracic aorta. Similar small anterior pericardial effusion. ABDOMEN/PELVIS: Tiny focus of hypermetabolism in far inferior right liver with max SUV 3.3, without discrete correlate on the noncontrast CT images. No additional foci of liver hypermetabolism. No abnormal hypermetabolic activity within the pancreas, adrenal glands, or spleen. No hypermetabolic lymph nodes in the abdomen or pelvis. Incidental CT findings: Atherosclerotic nonaneurysmal abdominal aorta. Mild sigmoid diverticulosis. Apparent hysterectomy. SKELETON: Numerous hypermetabolic osseous lesions scattered throughout the bilateral pelvic girdle, spine, ribs and right mandible with a subtle moth-eaten appearance on the corresponding CT images. Representative lesions as follows: -right mandible neck lesion with max SUV 6.4 (series 202/image 16) -anterolateral right fourth rib lesion with max SUV 4.6 (series 202/image 51) -L4 vertebral lesion with max SUV 7.6 -medial left iliac bone lesion with max SUV 10.0 -left ischium lesion with max SUV 5.2 -left symphysis pubis lesion with max SUV 6.3 Incidental CT  findings: None. IMPRESSION: 1. Hypermetabolic irregular solid right upper lobe 6.0 cm lung mass with numerous surrounding satellite pulmonary nodules, compatible with primary bronchogenic malignancy. 2. Hypermetabolic solid peripheral right upper lobe subpleural 1.7 cm pulmonary nodule, compatible with intralobar pulmonary metastasis.  3. Hypermetabolic metastatic right hilar adenopathy. Low level hypermetabolism within a small 0.7 cm low right paratracheal lymph node, suspicious for ipsilateral mediastinal nodal metastasis. 4. Widespread hypermetabolic osseous metastases scattered throughout the axial skeleton as detailed. 5. Tiny focus of hypermetabolism in the far inferior right liver without discrete correlate on the noncontrast CT images, indeterminate for early liver metastasis versus artifact. MRI abdomen without and with IV contrast may be obtained for further characterization if clinically warranted. 6. Chronic findings include: Similar small anterior pericardial effusion. Coronary atherosclerosis. Mild sigmoid diverticulosis. Aortic Atherosclerosis (ICD10-I70.0) and Emphysema (ICD10-J43.9). Electronically Signed   By: Delbert Phenix M.D.   On: 04/15/2023 16:46   CT Angio Chest PE W/Cm &/Or Wo Cm  Result Date: 04/02/2023 CLINICAL DATA:  Pulmonary embolism suspected, high probability. Productive cough for 1 month, shortness of breath. History of COPD. EXAM: CT ANGIOGRAPHY CHEST WITH CONTRAST TECHNIQUE: Multidetector CT imaging of the chest was performed using the standard protocol during bolus administration of intravenous contrast. Multiplanar CT image reconstructions and MIPs were obtained to evaluate the vascular anatomy. RADIATION DOSE REDUCTION: This exam was performed according to the departmental dose-optimization program which includes automated exposure control, adjustment of the mA and/or kV according to patient size and/or use of iterative reconstruction technique. CONTRAST:  75mL OMNIPAQUE IOHEXOL 350 MG/ML SOLN COMPARISON:  None Available. FINDINGS: Cardiovascular: Heart is normal in size and there is a trace pericardial effusion. Multi-vessel coronary artery calcifications are noted. There is atherosclerotic calcification of the aorta without evidence of aneurysm. The pulmonary trunk is normal in caliber. No evidence of  pulmonary embolism. Mediastinum/Nodes: No mediastinal or axillary lymphadenopathy. Enlarged lymph nodes are present the right hilum measuring up to 1.9 cm. Coarse calcifications are noted in the left lobe of the thyroid gland. The trachea and esophagus are within normal limits. Lungs/Pleura: Paraseptal and centrilobular emphysematous changes are present in the lungs. There is a lobular mass in the right upper lobe measuring 3.8 x 1.7 cm, axial image 42. Surrounding pulmonary nodules are noted in the right upper lobe measuring up to 8 mm. There is pleural thickening in the right upper lobe measuring 1.8 cm. There is narrowing of the right middle lobe bronchus with associated atelectasis and consolidation. Small 2-3 mm nodules are noted in the lungs bilaterally. No effusion or pneumothorax. Upper Abdomen: A subcentimeter hypodensity is noted in the left lobe of the liver which is too small to further characterize. No acute abnormality. Musculoskeletal: Degenerative changes are present in the thoracic spine. There are questionable erosions of the T5 rib on the right in the region of focal pleural thickening. Compression deformities are noted in the superior endplates at T10, Z61, and L1. Review of the MIP images confirms the above findings. IMPRESSION: 1. No evidence of pulmonary embolism. 2. Lobulated mass with surrounding satellite nodules in the right upper lobe measuring 3.9 x 6.7 cm. Consider one of the following in 3 months for both low-risk and high-risk individuals: (a) repeat chest CT, (b) follow-up PET-CT, or (c) tissue sampling. This recommendation follows the consensus statement: Guidelines for Management of Incidental Pulmonary Nodules Detected on CT Images: From the Fleischner Society 2017; Radiology 2017; 284:228-243. 3. Focal pleural thickening in the right upper lobe with questionable erosions of  the adjacent T5 rib on the right, possible metastatic disease. 4. Scattered 2-3 mm nodules bilaterally.  Attention on follow-up is recommended. 5. Atelectasis or infiltrate in the right middle lobe. 6. Emphysema. 7. Aortic atherosclerosis and coronary artery calcifications. Electronically Signed   By: Thornell Sartorius M.D.   On: 04/02/2023 20:33   DG Chest 2 View  Result Date: 04/02/2023 CLINICAL DATA:  Shortness of breath.  Swelling to the right face. EXAM: CHEST - 2 VIEW COMPARISON:  01/23/2021 FINDINGS: Heart size and pulmonary vascularity are normal. Lungs are clear. No pleural effusion or pneumothorax. Degenerative changes in the spine. Severe degenerative change in the left shoulder. Calcification of the aorta. IMPRESSION: No active cardiopulmonary disease. Electronically Signed   By: Burman Nieves M.D.   On: 04/02/2023 16:30

## 2023-04-17 NOTE — Anesthesia Postprocedure Evaluation (Signed)
Anesthesia Post Note  Patient: Kathleen Bennett  Procedure(s) Performed: VIDEO BRONCHOSCOPY WITH ENDOBRONCHIAL ULTRASOUND (Bilateral) BRONCHIAL NEEDLE ASPIRATION BIOPSIES     Patient location during evaluation: PACU Anesthesia Type: General Level of consciousness: awake and alert Pain management: pain level controlled Vital Signs Assessment: post-procedure vital signs reviewed and stable Respiratory status: spontaneous breathing, nonlabored ventilation, respiratory function stable and patient connected to nasal cannula oxygen Cardiovascular status: blood pressure returned to baseline and stable Postop Assessment: no apparent nausea or vomiting Anesthetic complications: no   No notable events documented.  Last Vitals:  Vitals:   04/16/23 1015 04/16/23 1030  BP: (!) 153/90 (!) 145/93  Pulse: 92 95  Resp: (!) 24 18  Temp:  (!) 36.3 C  SpO2: 96% 92%    Last Pain:  Vitals:   04/16/23 1030  TempSrc:   PainSc: 0-No pain                 Collene Schlichter

## 2023-04-18 ENCOUNTER — Inpatient Hospital Stay: Payer: Medicaid Other | Attending: Hematology | Admitting: Hematology

## 2023-04-18 ENCOUNTER — Other Ambulatory Visit: Payer: Self-pay

## 2023-04-18 ENCOUNTER — Other Ambulatory Visit: Payer: Self-pay | Admitting: Radiology

## 2023-04-18 ENCOUNTER — Encounter: Payer: Self-pay | Admitting: Hematology

## 2023-04-18 ENCOUNTER — Encounter (HOSPITAL_COMMUNITY): Payer: Self-pay | Admitting: Pulmonary Disease

## 2023-04-18 VITALS — BP 107/80 | HR 84 | Temp 97.7°F | Resp 18 | Ht 63.0 in | Wt 105.4 lb

## 2023-04-18 DIAGNOSIS — Z7982 Long term (current) use of aspirin: Secondary | ICD-10-CM | POA: Insufficient documentation

## 2023-04-18 DIAGNOSIS — C3411 Malignant neoplasm of upper lobe, right bronchus or lung: Secondary | ICD-10-CM | POA: Diagnosis not present

## 2023-04-18 DIAGNOSIS — C787 Secondary malignant neoplasm of liver and intrahepatic bile duct: Secondary | ICD-10-CM

## 2023-04-18 DIAGNOSIS — C7951 Secondary malignant neoplasm of bone: Secondary | ICD-10-CM | POA: Diagnosis not present

## 2023-04-18 DIAGNOSIS — Z885 Allergy status to narcotic agent status: Secondary | ICD-10-CM | POA: Insufficient documentation

## 2023-04-18 DIAGNOSIS — Z87891 Personal history of nicotine dependence: Secondary | ICD-10-CM | POA: Diagnosis not present

## 2023-04-18 DIAGNOSIS — C3490 Malignant neoplasm of unspecified part of unspecified bronchus or lung: Secondary | ICD-10-CM

## 2023-04-18 MED ORDER — PROCHLORPERAZINE MALEATE 10 MG PO TABS: 10.0000 mg | ORAL_TABLET | Freq: Four times a day (QID) | ORAL | 3 refills | Status: DC | PRN

## 2023-04-18 MED ORDER — MEGESTROL ACETATE 400 MG/10ML PO SUSP: 400.0000 mg | Freq: Every day | ORAL | 3 refills | Status: DC

## 2023-04-18 MED ORDER — LACTULOSE 20 GM/30ML PO SOLN: 20.0000 g | Freq: Every day | ORAL | 2 refills | Status: DC

## 2023-04-18 MED ORDER — TRAMADOL HCL 50 MG PO TABS: 50.0000 mg | ORAL_TABLET | Freq: Four times a day (QID) | ORAL | 0 refills | Status: DC | PRN

## 2023-04-18 NOTE — Patient Instructions (Addendum)
[facility]  Discharge Instructions  You were seen and examined today by Dr. Ellin Saba. Dr. Ellin Saba is a medical oncologist, meaning that he specializes in the treatment of cancer diagnoses. Dr. Ellin Saba discussed your past medical history, family history of cancers, and the events that led to you being here today.  You were referred to Dr. Ellin Saba by the Emergency Department doctor due to an abnormal CT scan that resulted in a PET scan and biopsy.  The PET scan revealed cancer that started in the lung with spread of lung cancer to the lymph nodes, bones and probable liver.  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 This is a rapidly growing, aggressive type of cancer. This is treated with a combination of chemotherapy and immunotherapy. This is given three days in a row every 21 days here in the Cancer Center.  Dr. Ellin Saba has recommended a brain MRI.  This cancer will be treated to control the spread of the cancer and minimize symptoms that you may be having. For this reason, you will need a Port-A-Cath placed.  Follow-up as scheduled.  Thank you for choosing [facility] to provide your oncology and hematology care.   To afford each patient quality time with our provider, please arrive at least 15 minutes before your scheduled appointment time. You may need to reschedule your appointment if you arrive late (10 or more minutes). Arriving late affects you and other patients whose appointments are after yours.  Also, if you miss three or more appointments without notifying the office, you may be dismissed from the clinic at the provider's discretion.    Again, thank you for choosing [facility].  Our hope is that these requests will decrease the amount of time that you wait before being seen by our physicians.   If you have a lab appointment with the Cancer Center - please  note that after April 8th, all labs will be drawn in the cancer center.  You do not have to check in or register with the main entrance as you have in the past but will complete your check-in at the cancer center.            _____________________________________________________________  Should you have questions after your visit to [facility], please contact our office at [phone] and follow the prompts.  Our office hours are 8:00 a.m. to 4:30 p.m. Monday - Thursday and 8:00 a.m. to 2:30 p.m. Friday.  Please note that voicemails left after 4:00 p.m. may not be returned until the following business day.  We are closed weekends and all major holidays.  You do have access to a nurse 24-7, just call the main number to the clinic [phone] and do not press any options, hold on the line and a nurse will answer the phone.    For prescription refill requests, have your pharmacy contact our office and allow 72 hours.    Masks are no longer required in the cancer centers. If you would like for your care team to wear a mask while they are taking care of you, please let them know. You may have one support person who is at least 64 years old accompany you for your appointments.

## 2023-04-18 NOTE — Progress Notes (Signed)
START ON PATHWAY REGIMEN - Small Cell Lung     Cycles 1 through 4: A cycle is every 21 days:     Durvalumab      Carboplatin      Etoposide    Cycles 5 and beyond: A cycle is every 28 days:     Durvalumab   **Always confirm dose/schedule in your pharmacy ordering system**  Patient Characteristics: Newly Diagnosed, Preoperative or Nonsurgical Candidate (Clinical Staging), First Line, Extensive Stage Therapeutic Status: Newly Diagnosed, Preoperative or Nonsurgical Candidate (Clinical Staging) AJCC T Category: cT3 AJCC N Category: cN2 AJCC M Category: cM1c AJCC 8 Stage Grouping: IVB Stage Classification: Extensive  Intent of Therapy: Non-Curative / Palliative Intent, Discussed with Patient 

## 2023-04-19 ENCOUNTER — Other Ambulatory Visit: Payer: Self-pay

## 2023-04-19 ENCOUNTER — Ambulatory Visit (HOSPITAL_COMMUNITY)
Admission: RE | Admit: 2023-04-19 | Discharge: 2023-04-19 | Disposition: A | Discharge status: Home / Self Care | Payer: Medicaid Other | Source: Ambulatory Visit | Attending: Hematology | Admitting: Hematology

## 2023-04-19 DIAGNOSIS — Z87891 Personal history of nicotine dependence: Secondary | ICD-10-CM | POA: Diagnosis not present

## 2023-04-19 DIAGNOSIS — I10 Essential (primary) hypertension: Secondary | ICD-10-CM | POA: Insufficient documentation

## 2023-04-19 DIAGNOSIS — J449 Chronic obstructive pulmonary disease, unspecified: Secondary | ICD-10-CM | POA: Insufficient documentation

## 2023-04-19 DIAGNOSIS — C3411 Malignant neoplasm of upper lobe, right bronchus or lung: Secondary | ICD-10-CM | POA: Insufficient documentation

## 2023-04-19 HISTORY — PX: IR IMAGING GUIDED PORT INSERTION: IMG5740

## 2023-04-19 MED ORDER — LIDOCAINE-EPINEPHRINE 1 %-1:100000 IJ SOLN
INTRAMUSCULAR | Status: AC
  Filled 2023-04-19: qty 1

## 2023-04-19 MED ORDER — MIDAZOLAM HCL 2 MG/2ML IJ SOLN
INTRAMUSCULAR | Status: AC
  Filled 2023-04-19: qty 2

## 2023-04-19 MED ORDER — FENTANYL CITRATE (PF) 100 MCG/2ML IJ SOLN
INTRAMUSCULAR | Status: AC
  Filled 2023-04-19: qty 2

## 2023-04-19 MED ORDER — LIDOCAINE-EPINEPHRINE 1 %-1:100000 IJ SOLN
20.0000 mL | Freq: Once | INTRAMUSCULAR | Status: AC
  Administered 2023-04-19: 20 mL via INTRADERMAL

## 2023-04-19 MED ORDER — HEPARIN SOD (PORK) LOCK FLUSH 100 UNIT/ML IV SOLN
INTRAVENOUS | Status: AC
  Filled 2023-04-19: qty 5

## 2023-04-19 MED ORDER — FENTANYL CITRATE (PF) 100 MCG/2ML IJ SOLN
INTRAMUSCULAR | Status: AC | PRN
  Administered 2023-04-19: 25 ug via INTRAVENOUS

## 2023-04-19 MED ORDER — MIDAZOLAM HCL 2 MG/2ML IJ SOLN
INTRAMUSCULAR | Status: AC | PRN
Start: 2023-04-19 — End: 2023-04-19
  Administered 2023-04-19 (×2): 1 mg via INTRAVENOUS
  Administered 2023-04-19 (×2): .5 mg via INTRAVENOUS

## 2023-04-19 MED ORDER — SODIUM CHLORIDE 0.9 % IV SOLN: INTRAVENOUS | Status: DC

## 2023-04-19 MED ORDER — FENTANYL CITRATE (PF) 100 MCG/2ML IJ SOLN
INTRAMUSCULAR | Status: AC | PRN
  Administered 2023-04-19 (×3): 25 ug via INTRAVENOUS

## 2023-04-19 NOTE — Patient Instructions (Signed)
[place] will see the doctor regularly throughout treatment.  We will obtain blood work from you prior to every treatment and monitor your results to make sure it is safe to give your treatment. The doctor monitors your response to treatment by the way you are feeling, your blood work, and by obtaining scans periodically.  There will be wait times while you are here for treatment.  It will take about 30 minutes to 1 hour for your lab work to result.  Then there will be wait times while pharmacy mixes your medications.    Medications you will receive in the clinic prior to your chemotherapy medications:  Aloxi:  ALOXI is used in adults to help prevent nausea and vomiting that happens with certain chemotherapy drugs.  Aloxi is a long acting medication, and will remain in your system for about two days.   Emend:  This is an anti-nausea medication that is used with Aloxi to help prevent nausea and vomiting caused by chemotherapy.  Dexamethasone:  This is a steroid given prior to chemotherapy to help prevent allergic reactions; it may also help prevent and control nausea and diarrhea.     Carboplatin (Paraplatin, CBDCA)  About This Drug  Carboplatin is used to treat cancer. It is given in the vein (IV).  This drug will take 30 minutes to infuse.   Possible Side Effects   Bone marrow suppression. This is a decrease in the number of white blood cells, red blood cells, and platelets. This may raise your risk of infection, make you tired and weak (fatigue), and raise your risk of bleeding.   Nausea and vomiting (throwing up)   Weakness   Changes in your liver function   Changes in your kidney function   Electrolyte changes   Pain  Note: Each of the side effects above was reported in 20% or greater of patients treated with carboplatin. Not all possible side effects are included above.  Warnings and Precautions   Severe bone marrow  suppression   Allergic reactions, including anaphylaxis are rare but may happen in some patients. Signs of allergic reaction to this drug may be swelling of the face, feeling like your tongue or throat are swelling, trouble breathing, rash, itching, fever, chills, feeling dizzy, and/or feeling that your heart is beating in a fast or not normal way. If this happens, do not take another dose of this drug. You should get urgent medical treatment.   Severe nausea and vomiting   Effects on the nerves are called peripheral neuropathy. This risk is increased if you are over the age of 59 or if you have received other medicine with risk of peripheral neuropathy. You may feel numbness, tingling, or pain in your hands and feet. It may be hard for you to button your clothes, open jars, or walk as usual. The effect on the nerves may get worse with more doses of the drug. These effects get better in some people after the drug is stopped but it does not get better in all people.   Blurred vision, loss of vision or other changes in eyesight   Decreased hearing   - Skin and tissue irritation including redness, pain, warmth, or swelling at the IV site if the drug leaks out of the vein and into nearby tissue.   Severe changes in your kidney function, which can cause kidney failure   Severe changes in your liver function, which can cause liver failure  Note: Some of the side effects above are very rare. If you have concerns and/or questions, please discuss them with your medical team.  Important Information   This drug may be present in the saliva, tears, sweat, urine, stool, vomit, semen, and vaginal secretions. Talk to your doctor and/or your nurse about the necessary precautions to take during this time.  Treating Side Effects   Manage tiredness by pacing your activities for the day.   Be sure to include periods of rest between energy-draining activities.   To decrease the risk of infection, wash your  hands regularly.   Avoid close contact with people who have a cold, the flu, or other infections.   Take your temperature as your doctor or nurse tells you, and whenever you feel like you may have a fever.   To help decrease the risk of bleeding, use a soft toothbrush. Check with your nurse before using dental floss.   Be very careful when using knives or tools.   Use an electric shaver instead of a razor.   Drink plenty of fluids (a minimum of eight glasses per day is recommended).   If you throw up or have loose bowel movements, you should drink more fluids so that you do not become dehydrated (lack of water in the body from losing too much fluid).   To help with nausea and vomiting, eat small, frequent meals instead of three large meals a day. Choose foods and drinks that are at room temperature. Ask your nurse or doctor about other helpful tips and medicine that is available to help stop or lessen these symptoms.   If you have numbness and tingling in your hands and feet, be careful when cooking, walking, and handling sharp objects and hot liquids.   Keeping your pain under control is important to your well-being. Please tell your doctor or nurse if you are experiencing pain.  Food and Drug Interactions  There are no known interactions of carboplatin with food.   This drug may interact with other medicines. Tell your doctor and pharmacist about all the prescription and over-the-counter medicines and dietary supplements (vitamins, minerals, herbs and others) that you are taking at this time. Also, check with your doctor or pharmacist before starting any new prescription or over-the-counter medicines, or dietary supplements to make sure that there are no interactions.  When to Call the Doctor  Call your doctor or nurse if you have any of these symptoms and/or any new or unusual symptoms:   Fever of 100.4 F (38 C) or higher   Chills   Tiredness that interferes with your daily  activities   Feeling dizzy or lightheaded   Easy bleeding or bruising   Nausea that stops you from eating or drinking and/or is not relieved by prescribed medicines   Throwing up   Blurred vision or other changes in eyesight   Decrease in hearing or ringing in the ear   Signs of allergic reaction: swelling of the face, feeling like your tongue or throat are swelling, trouble breathing, rash, itching, fever, chills, feeling dizzy, and/or feeling that your heart is beating in a fast or not normal way. If this happens, call 911 for emergency care.   Signs of possible liver problems: dark urine, pale bowel movements, bad stomach pain, feeling very tired and weak, unusual itching, or yellowing of the eyes or skin   Decreased urine, or very dark urine   Numbness, tingling, or pain in your hands and feet  Pain that does not go away or is not relieved by prescribed medicine   While you are getting this drug, please tell your nurse right away if you have any pain, redness, or swelling at the site of the IV infusion, or if you have any new onset of symptoms, or if you just feel "different" from before when the infusion was started.   Reproduction Warnings   Pregnancy warning: This drug may have harmful effects on the unborn baby. Women of child bearing potential should use effective methods of birth control during your cancer treatment. Let your doctor know right away if you think you may be pregnant.   Breastfeeding warning: It is not known if this drug passes into breast milk. For this reason, women should not breastfeed during treatment because this drug could enter the breast milk and cause harm to a breastfeeding baby.   Fertility warning: Human fertility studies have not been done with this drug. Talk with your doctor or nurse if you plan to have children. Ask for information on sperm or egg banking.  Etoposide  About This Drug  Etoposide is used to treat cancer. It is given in  the vein (IV).  This drug will take 1 hour to infuse.    Possible Side Effects   Bone marrow suppression. This is a decrease in the number of white blood cells, red blood cells, and platelets. This may raise your risk of infection, make you tired and weak (fatigue), and raise your risk of bleeding.   Nausea and vomiting (throwing up)   Hair loss. Hair loss is often temporary, although with certain medicine, hair loss can sometimes be permanent. Hair loss may happen suddenly or gradually. If you lose hair, you may lose it from your head, face, armpits, pubic area, chest, and/or legs. You may also notice your hair getting thin.  Note: Each of the side effects above was reported in 20% or greater of patients treated with etoposide. Not all possible side effects are included above.  Warnings and Precautions   Severe bone marrow suppression, which may be life-threatening   This drug may raise your risk of getting a second cancer, such as leukemia   Low blood pressure with rapid infusion of the medication   Allergic reactions, including anaphylaxis are rare but may happen in some patients. Signs of allergic reaction to this drug may be swelling of the face, feeling like your tongue or throat are swelling, trouble breathing, rash, itching, fever, chills, feeling dizzy, and/or feeling that your heart is beating in a fast or not normal way. If this happens, do not take another dose of this drug. You should get urgent medical treatment.   These side effects may be more severe if you are receiving high doses of this medication included in pre-transplant chemotherapy.  Treating Side Effects   Manage tiredness by pacing your activities for the day.   Be sure to include periods of rest between energy-draining activities.   To decrease the risk of infection, wash your hands regularly.   Avoid close contact with people who have a cold, the flu, or other infections.   Take your temperature as your  doctor or nurse tells you, and whenever you feel like you may have a fever.   To help decrease bleeding, use a soft toothbrush. Check with your nurse before using dental floss.   Be very careful when using knives or tools.   Use an electric shaver instead of a razor.  Drink plenty of fluids (a minimum of eight glasses per day is recommended).   If you throw up or have loose bowel movements, you should drink more fluids so that you do not become dehydrated (lack of water in the body from losing too much fluid).   To help with nausea and vomiting, eat small, frequent meals instead of three large meals a day. Choose foods and drinks that are at room temperature. Ask your nurse or doctor about other helpful tips and medicine that is available to help or stop lessen these symptoms.   To help with hair loss, wash with a mild shampoo and avoid washing your hair every day.   Avoid rubbing your scalp, pat your hair or scalp dry.   Avoid coloring your hair.   Limit your use of hair spray, electric curlers, blow dryers, and curling irons.   If you are interested in getting a wig, talk to your nurse. You can also call the [facility] at [phone] to find out information about the "Look Good, Feel Better" program close to where you live. It is a free program where women getting chemotherapy can learn about wigs, turbans and scarves as well as makeup techniques and skin and nail care.  Food and Drug Interactions   There are no known interactions of etoposide with food.   This drug may interact with other medicines. Tell your doctor and pharmacist about all the medicines and dietary supplements (vitamins, minerals, herbs and others) that you are taking at this time. The safety and use of dietary supplements and alternative diets are often not known. Using these might affect your cancer or interfere with your treatment. Until more is known, you should not use dietary supplements or  alternative diets without your cancer doctor's help.   There are known interactions of etoposide with blood thinning medicine such as warfarin. Ask your doctor what precautions you should take.  When to Call the Doctor  Call your doctor or nurse if you have any of these symptoms and/or any new or unusual symptoms:   Fever of 100.4 F (38 C) or higher   Chills   Tiredness that interferes with your daily activities   Feeling dizzy or lightheaded   Feeling that your heart is beating in a fast or not normal way (palpitations)   Easy bleeding or bruising   Nausea that stops you from eating or drinking and/or is not relieved by prescribed medicines   Throwing up   Signs of allergic reaction: swelling of the face, feeling like your tongue or throat are swelling, trouble breathing, rash, itching, fever, chills, feeling dizzy, and/or feeling that your heart is beating in a fast or not normal way   If you think you may be pregnant or may have impregnated your partner  Reproduction Warnings   Pregnancy warning: This drug can have harmful effects on the unborn baby. Women of childbearing potential should use effective methods of birth control during your cancer treatment and for at least 6 months after treatment. Men with female partners of childbearing potential should use effective methods of birth control during your cancer treatment and for at least 4 months after your cancer treatment. Let your doctor know right away if you think you may be pregnant or may have impregnated your partner.   Breastfeeding warning: It is not known if this drug passes into breast milk. For this reason, women should talk to their doctor about the risks and benefits of breastfeeding during  treatment with this drug because this drug may enter the breast milk and cause harm to a breastfeeding baby.   Fertility warning: In men and women both, this drug may affect your ability to have children in the future. Talk  with your doctor or nurse if you plan to have children. Ask for information on sperm or egg banking.   SELF CARE ACTIVITIES WHILE RECEIVING CHEMOTHERAPY:  Hydration Increase your fluid intake 48 hours prior to treatment and drink at least 8 to 12 cups (64 ounces) of water/decaffeinated beverages per day after treatment. You can still have your cup of coffee or soda but these beverages do not count as part of your 8 to 12 cups that you need to drink daily. No alcohol intake.  Medications Continue taking your normal prescription medication as prescribed.  If you start any new herbal or new supplements please let us know first to make sure it is safe.  Mouth Care Have teeth cleaned professionally before starting treatment. Keep dentures and partial plates clean. Use soft toothbrush and do not use mouthwashes that contain alcohol. Biotene is a good mouthwash that is available at most pharmacies or may be ordered by calling [phone]. Use warm salt water gargles (1 teaspoon salt per 1 quart warm water) before and after meals and at bedtime. If you need dental work, please let the doctor know before you go for your appointment so that we can coordinate the best possible time for you in regards to your chemo regimen. You need to also let your dentist know that you are actively taking chemo. We may need to do labs prior to your dental appointment.  Skin Care Always use sunscreen that has not expired and with SPF (Sun Protection Factor) of 50 or higher. Wear hats to protect your head from the sun. Remember to use sunscreen on your hands, ears, face, & feet.  Use good moisturizing lotions such as udder cream, eucerin, or even Vaseline. Some chemotherapies can cause dry skin, color changes in your skin and nails.    Avoid long, hot showers or baths. Use gentle, fragrance-free soaps and laundry detergent. Use moisturizers, preferably creams or ointments rather than lotions because the thicker consistency  is better at preventing skin dehydration. Apply the cream or ointment within 15 minutes of showering. Reapply moisturizer at night, and moisturize your hands every time after you wash them.  Hair Loss (if your doctor says your hair will fall out)  If your doctor says that your hair is likely to fall out, decide before you begin chemo whether you want to wear a wig. You may want to shop before treatment to match your hair color. Hats, turbans, and scarves can also camouflage hair loss, although some people prefer to leave their heads uncovered. If you go bare-headed outdoors, be sure to use sunscreen on your scalp. Cut your hair short. It eases the inconvenience of shedding lots of hair, but it also can reduce the emotional impact of watching your hair fall out. Don't perm or color your hair during chemotherapy. Those chemical treatments are already damaging to hair and can enhance hair loss. Once your chemo treatments are done and your hair has grown back, it's OK to resume dyeing or perming hair.  With chemotherapy, hair loss is almost always temporary. But when it grows back, it may be a different color or texture. In older adults who still had hair color before chemotherapy, the new growth may be completely gray.  Often,  new hair is very fine and soft.  Infection Prevention Please wash your hands for at least 30 seconds using warm soapy water. Handwashing is the #1 way to prevent the spread of germs. Stay away from sick people or people who are getting over a cold. If you develop respiratory systems such as green/yellow mucus production or productive cough or persistent cough let us know and we will see if you need an antibiotic. It is a good idea to keep a pair of gloves on when going into grocery stores/[employer] to decrease your risk of coming into contact with germs on the carts, etc. Carry alcohol hand gel with you at all times and use it frequently if out in public. If your temperature reaches  100.5 or higher please call the clinic and let us know.  If it is after hours or on the weekend please go to the ER if your temperature is over 100.5.  Please have your own personal thermometer at home to use.    Sex and bodily fluids If you are going to have sex, a condom must be used to protect the person that isn't taking chemotherapy. Chemo can decrease your libido (sex drive). For a few days after chemotherapy, chemotherapy can be excreted through your bodily fluids.  When using the toilet please close the lid and flush the toilet twice.  Do this for a few day after you have had chemotherapy.   Effects of chemotherapy on your sex life Some changes are simple and won't last long. They won't affect your sex life permanently.  Sometimes you may feel: too tired not strong enough to be very active sick or sore  not in the mood anxious or low Your anxiety might not seem related to sex. For example, you may be worried about the cancer and how your treatment is going. Or you may be worried about money, or about how you family are coping with your illness.  These things can cause stress, which can affect your interest in sex. It's important to talk to your partner about how you feel.  Remember - the changes to your sex life don't usually last long. There's usually no medical reason to stop having sex during chemo. The drugs won't have any long term physical effects on your performance or enjoyment of sex. Cancer can't be passed on to your partner during sex  Contraception It's important to use reliable contraception during treatment. Avoid getting pregnant while you or your partner are having chemotherapy. This is because the drugs may harm the baby. Sometimes chemotherapy drugs can leave a man or woman infertile.  This means you would not be able to have children in the future. You might want to talk to someone about permanent infertility. It can be very difficult to learn that you may no longer be  able to have children. Some people find counselling helpful. There might be ways to preserve your fertility, although this is easier for men than for women. You may want to speak to a fertility expert. You can talk about sperm banking or harvesting your eggs. You can also ask about other fertility options, such as donor eggs. If you have or have had breast cancer, your doctor might advise you not to take the contraceptive pill. This is because the hormones in it might affect the cancer. It is not known for sure whether or not chemotherapy drugs can be passed on through semen or secretions from the vagina. Because of this some doctors  advise people to use a barrier method if you have sex during treatment. This applies to vaginal, anal or oral sex. Generally, doctors advise a barrier method only for the time you are actually having the treatment and for about a week after your treatment. Advice like this can be worrying, but this does not mean that you have to avoid being intimate with your partner. You can still have close contact with your partner and continue to enjoy sex.  Animals If you have cats or birds we just ask that you not change the litter or change the cage.  Please have someone else do this for you while you are on chemotherapy.   Food Safety During and After Cancer Treatment Food safety is important for people both during and after cancer treatment. Cancer and cancer treatments, such as chemotherapy, radiation therapy, and stem cell/bone marrow transplantation, often weaken the immune system. This makes it harder for your body to protect itself from foodborne illness, also called food poisoning. Foodborne illness is caused by eating food that contains harmful bacteria, parasites, or viruses.  Foods to avoid Some foods have a higher risk of becoming tainted with bacteria. These include: Unwashed fresh fruit and vegetables, especially leafy vegetables that can hide dirt and other  contaminants Raw sprouts, such as alfalfa sprouts Raw or undercooked beef, especially ground beef, or other raw or undercooked meat and poultry Fatty, fried, or spicy foods immediately before or after treatment.  These can sit heavy on your stomach and make you feel nauseous. Raw or undercooked shellfish, such as oysters. Sushi and sashimi, which often contain raw fish.  Unpasteurized beverages, such as unpasteurized fruit juices, raw milk, raw yogurt, or cider Undercooked eggs, such as soft boiled, over easy, and poached; raw, unpasteurized eggs; or foods made with raw egg, such as homemade raw cookie dough and homemade mayonnaise  Simple steps for food safety  Shop smart. Do not buy food stored or displayed in an unclean area. Do not buy bruised or damaged fruits or vegetables. Do not buy cans that have cracks, dents, or bulges. Pick up foods that can spoil at the end of your shopping trip and store them in a cooler on the way home.  Prepare and clean up foods carefully. Rinse all fresh fruits and vegetables under running water, and dry them with a clean towel or paper towel. Clean the top of cans before opening them. After preparing food, wash your hands for 20 seconds with hot water and soap. Pay special attention to areas between fingers and under nails. Clean your utensils and dishes with hot water and soap. Disinfect your kitchen and cutting boards using 1 teaspoon of liquid, unscented bleach mixed into 1 quart of water.    Dispose of old food. Eat canned and packaged food before its expiration date (the "use by" or "best before" date). Consume refrigerated leftovers within 3 to 4 days. After that time, throw out the food. Even if the food does not smell or look spoiled, it still may be unsafe. Some bacteria, such as Listeria, can grow even on foods stored in the refrigerator if they are kept for too long.  Take precautions when eating out. At restaurants, avoid buffets and salad  bars where food sits out for a long time and comes in contact with many people. Food can become contaminated when someone with a virus, often a norovirus, or another "bug" handles it. Put any leftover food in a "to-go" container yourself, rather than  having the server do it. And, refrigerate leftovers as soon as you get home. Choose restaurants that are clean and that are willing to prepare your food as you order it cooked.   AT HOME MEDICATIONS:                                                                                                                                                                Compazine/Prochlorperazine 10mg  tablet. Take 1 tablet every 6 hours as needed for nausea/vomiting. (This can make you sleepy)   EMLA cream. Apply a quarter size amount to port site 1 hour prior to chemo. Do not rub in. Cover with plastic wrap.    Diarrhea Sheet   If you are having loose stools/diarrhea, please purchase Imodium and begin taking as outlined:  At the first sign of poorly formed or loose stools you should begin taking Imodium (loperamide) 2 mg capsules.  Take two tablets (4mg ) followed by one tablet (2mg ) every 2 hours - DO NOT EXCEED 8 tablets in 24 hours.  If it is bedtime and you are having loose stools, take 2 tablets at bedtime, then 2 tablets every 4 hours until morning.   Always call the [facility] if you are having loose stools/diarrhea that you can't get under control.  Loose stools/diarrhea leads to dehydration (loss of water) in your body.  We have other options of trying to get the loose stools/diarrhea to stop but you must let us know!   Constipation Sheet  Colace - 100 mg capsules - take 2 capsules daily.  If this doesn't help then you can increase to 2 capsules twice daily.  Please call if the above does not work for you. Do not go more than 2 days without a bowel movement.  It is very important that you do not become constipated.  It will make you feel sick to  your stomach (nausea) and can cause abdominal pain and vomiting.  Nausea Sheet   Compazine/Prochlorperazine 10mg  tablet. Take 1 tablet every 6 hours as needed for nausea/vomiting (This can make you drowsy).  If you are having persistent nausea (nausea that does not stop) please call the [facility] and let us know the amount of nausea that you are experiencing.  If you begin to vomit, you need to call the [facility] and if it is the weekend and you have vomited more than one time and can't get it to stop-go to the Emergency Room.  Persistent nausea/vomiting can lead to dehydration (loss of fluid in your body) and will make you feel very weak and unwell. Ice chips, sips of clear liquids, foods that are at room temperature, crackers, and toast tend to be better tolerated.   SYMPTOMS TO REPORT AS SOON AS POSSIBLE AFTER TREATMENT:  FEVER GREATER THAN 100.4 F  CHILLS WITH OR WITHOUT FEVER  NAUSEA AND VOMITING THAT IS NOT CONTROLLED WITH YOUR NAUSEA MEDICATION  UNUSUAL SHORTNESS OF BREATH  UNUSUAL BRUISING OR BLEEDING  TENDERNESS IN MOUTH AND THROAT WITH OR WITHOUT   PRESENCE OF ULCERS  URINARY PROBLEMS  BOWEL PROBLEMS  UNUSUAL RASH      Wear comfortable clothing and clothing appropriate for easy access to any Portacath or PICC line. Let us know if there is anything that we can do to make your therapy better!    What to do if you need assistance after hours or on the weekends: CALL [phone].  HOLD on the line, do not hang up.  You will hear multiple messages but at the end you will be connected with a nurse triage line.  They will contact the doctor if necessary.  Most of the time they will be able to assist you.  Do not call the hospital operator.      I have been informed and understand all of the instructions given to me and have received a copy. I have been instructed to call the clinic [phone] or my family physician as soon as possible for continued medical  care, if indicated. I do not have any more questions at this time but understand that I may call the [facility] or the Patient Navigator at [phone] during office hours should I have questions or need assistance in obtaining follow-up care.

## 2023-04-19 NOTE — H&P (Signed)
Chief Complaint: Patient was seen in consultation today for port-a-catheter placement.   Referring Physician(s): Air cabin crew  Supervising Physician: Marliss Coots  Patient Status: [facility] - Out-pt  History of Present Illness: Kathleen Bennett is a 62 y.o. female with a medical history significant for HTN, COPD and recently diagnosed right lung cancer. She presented to the ED early August with complaints of mouth pain - she had been receiving ongoing dental work. While in the ED she was noted to be 81% on room air with mild wheezing. Due to this finding she had a CT chest which showed a right lung mass with multiple nodules. Subsequent work up including a bronchoscopy revealed extensive non-small cell lung cancer. Her oncology team is preparing her for chemotherapy and she will require durable venous access.   Interventional Radiology has been asked to evaluate this patient for an image-guided port-a-catheter placement to facilitate her treatment plans.   Past Medical History:  Diagnosis Date   Anxiety    COPD (chronic obstructive pulmonary disease) (HCC)    DDD (degenerative disc disease), lumbar    Depression    Diverticulitis    GERD (gastroesophageal reflux disease)    Heart murmur    High cholesterol    History of pneumonia as a child    Hypertension    Oxygen dependent    3L [place] Walk   PONV (postoperative nausea and vomiting)     Past Surgical History:  Procedure Laterality Date   ABDOMINAL HYSTERECTOMY     BIOPSY  10/12/2021   Procedure: BIOPSY;  Surgeon: Lanelle Bal, DO;  Location: [facility]O [facility];  Service: Endoscopy;;   BRONCHIAL NEEDLE ASPIRATION BIOPSY  04/16/2023   Procedure: BRONCHIAL NEEDLE ASPIRATION BIOPSIES;  Surgeon: Josephine Igo, DO;  Location: [facility] ENDOSCOPY;  Service: Pulmonary;;   COLONOSCOPY WITH PROPOFOL N/A 10/12/2021   Procedure: COLONOSCOPY WITH PROPOFOL;  Surgeon: Lanelle Bal, DO;  Location: [facility];  Service: Endoscopy;   Laterality: N/A;  1:30 / ASA 3   VIDEO BRONCHOSCOPY WITH ENDOBRONCHIAL ULTRASOUND Bilateral 04/16/2023   Procedure: VIDEO BRONCHOSCOPY WITH ENDOBRONCHIAL ULTRASOUND;  Surgeon: Josephine Igo, DO;  Location: [facility] ENDOSCOPY;  Service: Pulmonary;  Laterality: Bilateral;    Allergies: Codeine  Medications: Prior to Admission medications   Medication Sig Start Date End Date Taking? Authorizing Provider  albuterol (PROVENTIL HFA;VENTOLIN HFA) 108 (90 BASE) MCG/ACT inhaler Inhale 2 puffs into the lungs every 6 (six) hours as needed for shortness of breath or wheezing.    [provider]  albuterol (PROVENTIL) (2.5 MG/3ML) 0.083% nebulizer solution Take 2.5 mg by nebulization every 6 (six) hours as needed for wheezing or shortness of breath.    [provider]  aspirin EC 81 MG tablet Take 81 mg by mouth daily. Swallow whole.    [provider]  Aspirin-Acetaminophen-Caffeine (GOODYS EXTRA STRENGTH) [phone] MG PACK Take 1 packet by mouth 2 (two) times daily as needed (pain.).    [provider]  baclofen (LIORESAL) 10 MG tablet Take 10 mg by mouth every 8 (eight) hours as needed. 03/26/23   [provider]  Cholecalciferol (VITAMIN D-3) 125 MCG (5000 UT) TABS Take 5,000 Units by mouth in the morning.    [provider]  furosemide (LASIX) 40 MG tablet Take 20 mg by mouth daily as needed for fluid or edema. 03/18/23   [provider]  Lactulose 20 GM/30ML SOLN Take 30 mLs (20 g total) by mouth daily. 04/18/23   Doreatha Massed, MD  lidocaine (XYLOCAINE) 2 % solution SMARTSIG:By Mouth 04/16/23   [provider]  lovastatin (MEVACOR) 20 MG tablet Take 20 mg by mouth every morning.    [provider]  megestrol (MEGACE) 400 MG/10ML suspension Place 10 mLs (400 mg total) into feeding tube daily. 04/18/23   Doreatha Massed, MD  Multiple Vitamin (MULTIVITAMIN WITH MINERALS) TABS tablet Take 1 tablet by mouth daily. 04/05/23    Johnson, Clanford L, MD  omeprazole (PRILOSEC) 20 MG capsule Take 20 mg by mouth daily before breakfast.    [provider]  PARoxetine (PAXIL) 20 MG tablet Take 20 mg by mouth in the morning.    [provider]  potassium chloride (KLOR-CON) 10 MEQ tablet Take 10 mEq by mouth every morning. 03/22/23   [provider]  prochlorperazine (COMPAZINE) 10 MG tablet Take 1 tablet (10 mg total) by mouth every 6 (six) hours as needed for nausea or vomiting. 04/18/23   Doreatha Massed, MD  simethicone (MYLICON) 125 MG chewable tablet Chew 125 mg by mouth every 6 (six) hours as needed for flatulence.    [provider]  SYMBICORT 80-4.5 MCG/ACT inhaler Inhale 2 puffs into the lungs in the morning and at bedtime. 05/16/15   [provider]  traMADol (ULTRAM) 50 MG tablet Take 1 tablet (50 mg total) by mouth every 6 (six) hours as needed. 04/18/23   Doreatha Massed, MD  verapamil (CALAN) 120 MG tablet Take 240 mg by mouth 2 (two) times daily.    [provider]  vitamin B-12 (CYANOCOBALAMIN) 1000 MCG tablet Take 1,000 mcg by mouth in the morning.    [provider]  zolpidem (AMBIEN) 5 MG tablet Take 5 mg by mouth at bedtime. 04/10/23   [provider]     No family history on file.  Social History   Socioeconomic History   Marital status: Married    Spouse name: Not on file   Number of children: Not on file   Years of education: Not on file   Highest education level: Not on file  Occupational History   Not on file  Tobacco Use   Smoking status: Former    Current packs/day: 1.50    Average packs/day: 1.5 packs/day for 40.0 years (60.0 ttl pk-yrs)    Types: Cigarettes   Smokeless tobacco: Never  Vaping Use   Vaping status: Never Used  Substance and Sexual Activity   Alcohol use: Yes    Comment: beer daily x 6   Drug use: No   Sexual activity: Not on file  Other Topics Concern   Not on file  Social History  Narrative   Not on file   Social Determinants of Health   Financial Resource Strain: Not on file  Food Insecurity: Food Insecurity Present (04/03/2023)   Hunger Vital Sign    Worried About Running Out of Food in the Last Year: Often true    Ran Out of Food in the Last Year: Never true  Transportation Needs: No Transportation Needs (04/03/2023)   PRAPARE - Administrator, Civil Service (Medical): No    Lack of Transportation (Non-Medical): No  Physical Activity: Not on file  Stress: Not on file  Social Connections: Not on file    Review of Systems: A 12 point ROS discussed and pertinent positives are indicated in the HPI above.  All other systems are negative.  Review of Systems  Constitutional:  Positive for appetite change and fatigue.  Respiratory:  Positive  for shortness of breath. Negative for cough.   Gastrointestinal:  Positive for nausea and vomiting.  Neurological:  Positive for dizziness. Negative for headaches.    Vital Signs: BP (!) 156/94   Pulse (!) 101   Temp 97.7 F (36.5 C) (Temporal)   Resp 18   Ht 5\' 4"  (1.626 m)   Wt 105 lb (47.6 kg)   SpO2 97%   BMI 18.02 kg/m   Physical Exam Constitutional:      General: She is not in acute distress.    Appearance: She is ill-appearing.  HENT:     Mouth/Throat:     Mouth: Mucous membranes are moist.     Pharynx: Oropharynx is clear.  Pulmonary:     Effort: Pulmonary effort is normal.  Abdominal:     Palpations: Abdomen is soft.     Tenderness: There is no abdominal tenderness.  Musculoskeletal:     Right lower leg: No edema.     Left lower leg: No edema.  Skin:    General: Skin is warm and dry.  Neurological:     Mental Status: She is alert and oriented to person, place, and time.     Imaging: NM PET Image Initial (PI) Skull Base To Thigh (F-18 FDG)  Result Date: 04/15/2023 CLINICAL DATA:  Initial treatment strategy for right upper lobe lung mass. EXAM: NUCLEAR MEDICINE PET SKULL BASE TO  THIGH TECHNIQUE: 6.2 mCi F-18 FDG was injected intravenously. Full-ring PET imaging was performed from the skull base to thigh after the radiotracer. CT data was obtained and used for attenuation correction and anatomic localization. Fasting blood glucose: 143 mg/dl COMPARISON:  09/81/1914 chest CT angiogram FINDINGS: Mediastinal blood pool activity: SUV max 2.1 Liver activity: SUV max NA NECK: No hypermetabolic lymph nodes in the neck. Incidental CT findings: None. CHEST: Hypermetabolic irregular solid right upper lobe 6.0 x 3.2 cm lung mass with max SUV 10.1 (series 203/image 16) with numerous surrounding satellite pulmonary nodules up to 0.8 cm in the medial right upper lobe (series 203/image 14), not appreciably changed from 04/02/2023 chest CT angiogram study. Hypermetabolic solid peripheral right upper lobe subpleural 1.7 cm pulmonary nodule with max SUV 4.5 (series 203/image 22), stable from recent CTA. No hypermetabolic left lung findings. Enlarged hypermetabolic 2.6 cm short axis diameter right hilar lymph node with max SUV 9.0. No enlarged or hypermetabolic left hilar nodes. Low right paratracheal small 0.7 cm node demonstrates low level hypermetabolism with max SUV 2.3 (series 203/image 21). No additional enlarged or hypermetabolic mediastinal nodes. No enlarged or hypermetabolic axillary nodes. Incidental CT findings: Moderate centrilobular emphysema. Coronary atherosclerosis. Atherosclerotic nonaneurysmal thoracic aorta. Similar small anterior pericardial effusion. ABDOMEN/PELVIS: Tiny focus of hypermetabolism in far inferior right liver with max SUV 3.3, without discrete correlate on the noncontrast CT images. No additional foci of liver hypermetabolism. No abnormal hypermetabolic activity within the pancreas, adrenal glands, or spleen. No hypermetabolic lymph nodes in the abdomen or pelvis. Incidental CT findings: Atherosclerotic nonaneurysmal abdominal aorta. Mild sigmoid diverticulosis. Apparent  hysterectomy. SKELETON: Numerous hypermetabolic osseous lesions scattered throughout the bilateral pelvic girdle, spine, ribs and right mandible with a subtle moth-eaten appearance on the corresponding CT images. Representative lesions as follows: -right mandible neck lesion with max SUV 6.4 (series 202/image 16) -anterolateral right fourth rib lesion with max SUV 4.6 (series 202/image 51) -L4 vertebral lesion with max SUV 7.6 -medial left iliac bone lesion with max SUV 10.0 -left ischium lesion with max SUV 5.2 -left symphysis pubis lesion with max  SUV 6.3 Incidental CT findings: None. IMPRESSION: 1. Hypermetabolic irregular solid right upper lobe 6.0 cm lung mass with numerous surrounding satellite pulmonary nodules, compatible with primary bronchogenic malignancy. 2. Hypermetabolic solid peripheral right upper lobe subpleural 1.7 cm pulmonary nodule, compatible with intralobar pulmonary metastasis. 3. Hypermetabolic metastatic right hilar adenopathy. Low level hypermetabolism within a small 0.7 cm low right paratracheal lymph node, suspicious for ipsilateral mediastinal nodal metastasis. 4. Widespread hypermetabolic osseous metastases scattered throughout the axial skeleton as detailed. 5. Tiny focus of hypermetabolism in the far inferior right liver without discrete correlate on the noncontrast CT images, indeterminate for early liver metastasis versus artifact. MRI abdomen without and with IV contrast may be obtained for further characterization if clinically warranted. 6. Chronic findings include: Similar small anterior pericardial effusion. Coronary atherosclerosis. Mild sigmoid diverticulosis. Aortic Atherosclerosis (ICD10-I70.0) and Emphysema (ICD10-J43.9). Electronically Signed   By: Delbert Phenix M.D.   On: 04/15/2023 16:46   CT Angio Chest PE W/Cm &/Or Wo Cm  Result Date: 04/02/2023 CLINICAL DATA:  Pulmonary embolism suspected, high probability. Productive cough for 1 month, shortness of breath.  History of COPD. EXAM: CT ANGIOGRAPHY CHEST WITH CONTRAST TECHNIQUE: Multidetector CT imaging of the chest was performed using the standard protocol during bolus administration of intravenous contrast. Multiplanar CT image reconstructions and MIPs were obtained to evaluate the vascular anatomy. RADIATION DOSE REDUCTION: This exam was performed according to the departmental dose-optimization program which includes automated exposure control, adjustment of the mA and/or kV according to patient size and/or use of iterative reconstruction technique. CONTRAST:  75mL OMNIPAQUE IOHEXOL 350 MG/ML SOLN COMPARISON:  None Available. FINDINGS: Cardiovascular: Heart is normal in size and there is a trace pericardial effusion. Multi-vessel coronary artery calcifications are noted. There is atherosclerotic calcification of the aorta without evidence of aneurysm. The pulmonary trunk is normal in caliber. No evidence of pulmonary embolism. Mediastinum/Nodes: No mediastinal or axillary lymphadenopathy. Enlarged lymph nodes are present the right hilum measuring up to 1.9 cm. Coarse calcifications are noted in the left lobe of the thyroid gland. The trachea and esophagus are within normal limits. Lungs/Pleura: Paraseptal and centrilobular emphysematous changes are present in the lungs. There is a lobular mass in the right upper lobe measuring 3.8 x 1.7 cm, axial image 42. Surrounding pulmonary nodules are noted in the right upper lobe measuring up to 8 mm. There is pleural thickening in the right upper lobe measuring 1.8 cm. There is narrowing of the right middle lobe bronchus with associated atelectasis and consolidation. Small 2-3 mm nodules are noted in the lungs bilaterally. No effusion or pneumothorax. Upper Abdomen: A subcentimeter hypodensity is noted in the left lobe of the liver which is too small to further characterize. No acute abnormality. Musculoskeletal: Degenerative changes are present in the thoracic spine. There are  questionable erosions of the T5 rib on the right in the region of focal pleural thickening. Compression deformities are noted in the superior endplates at T10, Z61, and L1. Review of the MIP images confirms the above findings. IMPRESSION: 1. No evidence of pulmonary embolism. 2. Lobulated mass with surrounding satellite nodules in the right upper lobe measuring 3.9 x 6.7 cm. Consider one of the following in 3 months for both low-risk and high-risk individuals: (a) repeat chest CT, (b) follow-up PET-CT, or (c) tissue sampling. This recommendation follows the consensus statement: Guidelines for Management of Incidental Pulmonary Nodules Detected on CT Images: From the Fleischner Society 2017; Radiology 2017; 284:228-243. 3. Focal pleural thickening in the right upper lobe  with questionable erosions of the adjacent T5 rib on the right, possible metastatic disease. 4. Scattered 2-3 mm nodules bilaterally. Attention on follow-up is recommended. 5. Atelectasis or infiltrate in the right middle lobe. 6. Emphysema. 7. Aortic atherosclerosis and coronary artery calcifications. Electronically Signed   By: Thornell Sartorius M.D.   On: 04/02/2023 20:33   DG Chest 2 View  Result Date: 04/02/2023 CLINICAL DATA:  Shortness of breath.  Swelling to the right face. EXAM: CHEST - 2 VIEW COMPARISON:  01/23/2021 FINDINGS: Heart size and pulmonary vascularity are normal. Lungs are clear. No pleural effusion or pneumothorax. Degenerative changes in the spine. Severe degenerative change in the left shoulder. Calcification of the aorta. IMPRESSION: No active cardiopulmonary disease. Electronically Signed   By: Burman Nieves M.D.   On: 04/02/2023 16:30    Labs:  CBC: Recent Labs    04/02/23 1531 04/03/23 0644 04/16/23 0738 04/16/23 1036  WBC 14.4* 13.6*  --   --   HGB 15.5* 14.4 16.3* 14.3  HCT 45.4 42.9 48.0* 42.0  PLT 294 248  --   --     COAGS: No results for input(s): "INR", "APTT" in the last 8760  hours.  BMP: Recent Labs    04/02/23 1531 04/03/23 0532 04/04/23 0754 04/16/23 0738 04/16/23 1036  NA 134* 134* 136 133* 132*  K 3.2* 3.8 2.9* 2.7* 5.4*  CL 90* 96* 95* 89* 93*  CO2 28 24 28   --   --   GLUCOSE 94 126* 115* 91 91  BUN 17 9 8  24* 22  CALCIUM 10.3 9.9 10.5*  --   --   CREATININE 0.90 0.52 0.53 1.00 1.00  GFRNONAA >60 >60 >60  --   --     LIVER FUNCTION TESTS: Recent Labs    04/02/23 1531  BILITOT 0.8  AST 23  ALT 21  ALKPHOS 68  PROT 7.1  ALBUMIN 4.0    TUMOR MARKERS: No results for input(s): "AFPTM", "CEA", "CA199", "CHROMGRNA" in the last 8760 hours.  Assessment and Plan:  Small cell lung cancer: Recardo Evangelist. Aird, 62 year old female, presents today to the [facility] Interventional Radiology department for an image-guided port-a-catheter placement.   Risks and benefits of image-guided port-a-catheter placement were discussed with the patient including, but not limited to bleeding, infection, pneumothorax, or fibrin sheath development and need for additional procedures.  All of the patient's questions were answered, patient is agreeable to proceed. She has been NPO. She is a full code.   Consent signed and in chart.  Thank you for this interesting consult.  I greatly enjoyed meeting TELITHA STANDIFORD and look forward to participating in their care.  A copy of this report was sent to the requesting provider on this date.  Electronically Signed: Alwyn Ren, AGACNP-BC [phone] 04/19/2023, 10:41 AM   I spent a total of  30 Minutes   in face to face in clinical consultation, greater than 50% of which was counseling/coordinating care for port-a-catheter placement.

## 2023-04-19 NOTE — Procedures (Signed)
Interventional Radiology Procedure Note ° °Procedure: Single Lumen Power Port Placement   ° °Access:  Right internal jugular vein ° °Findings: Catheter tip positioned at cavoatrial junction. Port is ready for immediate use.  ° °Complications: None ° °EBL: < 10 mL ° °Recommendations:  °- Ok to shower in 24 hours °- Do not submerge for 7 days °- Routine line care  ° ° °Dylan Suttle, MD ° ° ° °

## 2023-04-20 ENCOUNTER — Ambulatory Visit
Admission: RE | Admit: 2023-04-20 | Discharge: 2023-04-20 | Disposition: A | Discharge status: Home / Self Care | Payer: Medicaid Other | Source: Ambulatory Visit | Attending: Hematology | Admitting: Hematology

## 2023-04-20 DIAGNOSIS — C3490 Malignant neoplasm of unspecified part of unspecified bronchus or lung: Secondary | ICD-10-CM | POA: Diagnosis not present

## 2023-04-20 MED ORDER — GADOBUTROL 1 MMOL/ML IV SOLN
4.0000 mL | Freq: Once | INTRAVENOUS | Status: AC | PRN
  Administered 2023-04-20: 4 mL via INTRAVENOUS

## 2023-04-21 ENCOUNTER — Encounter (HOSPITAL_COMMUNITY): Payer: Self-pay

## 2023-04-21 ENCOUNTER — Other Ambulatory Visit: Payer: Self-pay

## 2023-04-21 ENCOUNTER — Emergency Department (HOSPITAL_COMMUNITY): Payer: Medicaid Other

## 2023-04-21 ENCOUNTER — Emergency Department (HOSPITAL_COMMUNITY)
Admission: EM | Admit: 2023-04-21 | Discharge: 2023-04-21 | Disposition: A | Discharge status: Home / Self Care | Payer: Medicaid Other | Attending: Emergency Medicine | Admitting: Emergency Medicine

## 2023-04-21 DIAGNOSIS — Z7982 Long term (current) use of aspirin: Secondary | ICD-10-CM | POA: Insufficient documentation

## 2023-04-21 DIAGNOSIS — R072 Precordial pain: Secondary | ICD-10-CM | POA: Insufficient documentation

## 2023-04-21 DIAGNOSIS — G8918 Other acute postprocedural pain: Secondary | ICD-10-CM | POA: Insufficient documentation

## 2023-04-21 DIAGNOSIS — L7632 Postprocedural hematoma of skin and subcutaneous tissue following other procedure: Secondary | ICD-10-CM | POA: Diagnosis not present

## 2023-04-21 LAB — CBC WITH DIFFERENTIAL/PLATELET
Abs Immature Granulocytes: 0.05 10*3/uL (ref 0.00–0.07)
Basophils Absolute: 0 10*3/uL (ref 0.0–0.1)
Basophils Relative: 0 %
Eosinophils Absolute: 0 10*3/uL (ref 0.0–0.5)
Eosinophils Relative: 0 %
HCT: 40.8 % (ref 36.0–46.0)
Hemoglobin: 13.9 g/dL (ref 12.0–15.0)
Immature Granulocytes: 1 %
Lymphocytes Relative: 12 %
Lymphs Abs: 1.3 10*3/uL (ref 0.7–4.0)
MCH: 32.9 pg (ref 26.0–34.0)
MCHC: 34.1 g/dL (ref 30.0–36.0)
MCV: 96.5 fL (ref 80.0–100.0)
Monocytes Absolute: 1 10*3/uL (ref 0.1–1.0)
Monocytes Relative: 9 %
Neutro Abs: 8.6 10*3/uL — ABNORMAL HIGH (ref 1.7–7.7)
Neutrophils Relative %: 78 %
Platelets: 163 10*3/uL (ref 150–400)
RBC: 4.23 MIL/uL (ref 3.87–5.11)
RDW: 15.8 % — ABNORMAL HIGH (ref 11.5–15.5)
WBC: 11 10*3/uL — ABNORMAL HIGH (ref 4.0–10.5)
nRBC: 0 % (ref 0.0–0.2)

## 2023-04-21 LAB — BASIC METABOLIC PANEL
Anion gap: 10 (ref 5–15)
BUN: 15 mg/dL (ref 8–23)
CO2: 31 mmol/L (ref 22–32)
Calcium: 11.8 mg/dL — ABNORMAL HIGH (ref 8.9–10.3)
Chloride: 91 mmol/L — ABNORMAL LOW (ref 98–111)
Creatinine, Ser: 0.64 mg/dL (ref 0.44–1.00)
GFR, Estimated: >60 mL/min (ref >60)
Glucose, Bld: 79 mg/dL (ref 70–99)
Potassium: 3.4 mmol/L — ABNORMAL LOW (ref 3.5–5.1)
Sodium: 132 mmol/L — ABNORMAL LOW (ref 135–145)

## 2023-04-21 NOTE — Progress Notes (Signed)
Pharmacist Chemotherapy Monitoring - Initial Assessment    Anticipated start date: 04/23/23   The following has been reviewed per standard work regarding the patient's treatment regimen: The patient's diagnosis, treatment plan and drug doses, and organ/hematologic function Lab orders and baseline tests specific to treatment regimen  The treatment plan start date, drug sequencing, and pre-medications Prior authorization status  Patient's documented medication list, including drug-drug interaction screen and prescriptions for anti-emetics and supportive care specific to the treatment regimen The drug concentrations, fluid compatibility, administration routes, and timing of the medications to be used The patient's access for treatment and lifetime cumulative dose history, if applicable  The patient's medication allergies and previous infusion related reactions, if applicable   Changes made to treatment plan:  N/A  Follow up needed:  Pending authorization for treatment    Stephens Shire, [facility], 04/21/2023  12:42 PM

## 2023-04-21 NOTE — ED Triage Notes (Signed)
Pt had a [facility] days ago [facility] and woke this morning with blood all over.  Pt dressing is soaked in blood and is bleeding through her shirt.  Dr. Aileen Pilot is aware.

## 2023-04-21 NOTE — ED Notes (Signed)
Pt port site assessed by RN and MD. RN cleaned and dressed site for pt to go home. Pt advised to see primary care or surgeon the following week to assess for healing and infection.

## 2023-04-21 NOTE — ED Provider Notes (Signed)
[place] EMERGENCY DEPARTMENT AT [facility] Provider Note   CSN: [id number] Arrival date & time: 04/21/23  8469     History {Add pertinent medical, surgical, social history, OB history to HPI:1} Chief Complaint  Patient presents with   Post-op Problem    Kathleen Bennett is a 62 y.o. female.  Patient has lung cancer and had a port placed in her chest for chemotherapy.  She is complaining of bleeding   Chest Pain      Home Medications Prior to Admission medications   Medication Sig Start Date End Date Taking? Authorizing Provider  albuterol (PROVENTIL HFA;VENTOLIN HFA) 108 (90 BASE) MCG/ACT inhaler Inhale 2 puffs into the lungs every 6 (six) hours as needed for shortness of breath or wheezing.    [provider]  albuterol (PROVENTIL) (2.5 MG/3ML) 0.083% nebulizer solution Take 2.5 mg by nebulization every 6 (six) hours as needed for wheezing or shortness of breath.    [provider]  aspirin EC 81 MG tablet Take 81 mg by mouth daily. Swallow whole.    [provider]  Aspirin-Acetaminophen-Caffeine (GOODYS EXTRA STRENGTH) [phone] MG PACK Take 1 packet by mouth 2 (two) times daily as needed (pain.).    [provider]  baclofen (LIORESAL) 10 MG tablet Take 10 mg by mouth every 8 (eight) hours as needed. 03/26/23   [provider]  CARBOPLATIN IV Inject into the vein every 21 ( twenty-one) days. 04/23/23   [provider]  Cholecalciferol (VITAMIN D-3) 125 MCG (5000 UT) TABS Take 5,000 Units by mouth in the morning.    [provider]  Durvalumab (IMFINZI IV) Inject into the vein every 21 ( twenty-one) days. 04/23/23   [provider]  ETOPOSIDE IV Inject into the vein every 21 ( twenty-one) days. 04/23/23   [provider]  furosemide (LASIX) 40 MG tablet Take 20 mg by mouth daily as needed for fluid or edema. 03/18/23   [provider]  Lactulose 20 GM/30ML SOLN Take 30 mLs (20 g  total) by mouth daily. 04/18/23   Doreatha Massed, MD  lidocaine (XYLOCAINE) 2 % solution SMARTSIG:By Mouth 04/16/23   [provider]  lovastatin (MEVACOR) 20 MG tablet Take 20 mg by mouth every morning.    [provider]  megestrol (MEGACE) 400 MG/10ML suspension Place 10 mLs (400 mg total) into feeding tube daily. 04/18/23   Doreatha Massed, MD  Multiple Vitamin (MULTIVITAMIN WITH MINERALS) TABS tablet Take 1 tablet by mouth daily. 04/05/23   Johnson, Clanford L, MD  omeprazole (PRILOSEC) 20 MG capsule Take 20 mg by mouth daily before breakfast.    [provider]  PARoxetine (PAXIL) 20 MG tablet Take 20 mg by mouth in the morning.    [provider]  potassium chloride (KLOR-CON) 10 MEQ tablet Take 10 mEq by mouth every morning. 03/22/23   [provider]  prochlorperazine (COMPAZINE) 10 MG tablet Take 1 tablet (10 mg total) by mouth every 6 (six) hours as needed for nausea or vomiting. 04/18/23   Doreatha Massed, MD  simethicone (MYLICON) 125 MG chewable tablet Chew 125 mg by mouth every 6 (six) hours as needed for flatulence.    [provider]  SYMBICORT 80-4.5 MCG/ACT inhaler Inhale 2 puffs into the lungs in the morning and at bedtime. 05/16/15   [provider]  traMADol (ULTRAM) 50 MG tablet Take 1 tablet (50 mg total) by mouth every 6 (six) hours as needed. 04/18/23   Ellin Saba,  Vern Claude, MD  verapamil (CALAN) 120 MG tablet Take 240 mg by mouth 2 (two) times daily.    [provider]  vitamin B-12 (CYANOCOBALAMIN) 1000 MCG tablet Take 1,000 mcg by mouth in the morning.    [provider]  zolpidem (AMBIEN) 5 MG tablet Take 5 mg by mouth at bedtime. 04/10/23   [provider]      Allergies    Codeine    Review of Systems   Review of Systems  Cardiovascular:  Positive for chest pain.    Physical Exam Updated Vital Signs BP (!) 142/108 (BP Location: Right Arm)   Pulse (!) 119   Resp  20   Ht 5\' 4"  (1.626 m)   Wt 47.6 kg   SpO2 99%   BMI 18.01 kg/m  Physical Exam  ED Results / Procedures / Treatments   Labs (all labs ordered are listed, but only abnormal results are displayed) Labs Reviewed  CBC WITH DIFFERENTIAL/PLATELET - Abnormal; Notable for the following components:      Result Value   WBC 11.0 (*)    RDW 15.8 (*)    Neutro Abs 8.6 (*)    All other components within normal limits  BASIC METABOLIC PANEL - Abnormal; Notable for the following components:   Sodium 132 (*)    Potassium 3.4 (*)    Chloride 91 (*)    Calcium 11.8 (*)    All other components within normal limits    EKG None  Radiology DG Chest Port 1 View  Result Date: 04/21/2023 CLINICAL DATA:  Shortness of breath. Status post porta catheter placement. There is now blood at the porta catheter insertion site. EXAM: PORTABLE CHEST 1 VIEW COMPARISON:  PET-CT 04/11/2023. FINDINGS: There is a right chest wall port a catheter. The tip is in the projection of the cavoatrial junction. No pneumothorax identified. Heart size and mediastinal contours are stable. No pleural fluid or interstitial edema. The previously noted pleural base nodule overlying the anterolateral right upper lobe is again seen and appears unchanged. The right upper lobe anteromedial perihilar lung mass is suboptimally visualized by portable technique. Chronic deformity of the left humeral head is again noted. IMPRESSION: 1. Right chest wall port a catheter tip is in the projection of the cavoatrial junction. No pneumothorax identified. 2. Stable appearance of right upper lobe pleural base nodule. 3. Right upper lobe anteromedial perihilar lung mass is suboptimally visualized by portable technique. Electronically Signed   By: Signa Kell M.D.   On: 04/21/2023 11:53   MR Brain W Wo Contrast  Result Date: 04/20/2023 CLINICAL DATA:  Small cell lung cancer (SCLC), staging new blurry vision EXAM: MRI HEAD WITHOUT AND WITH CONTRAST  TECHNIQUE: Multiplanar, multiecho pulse sequences of the brain and surrounding structures were obtained without and with intravenous contrast. CONTRAST:  4mL GADAVIST GADOBUTROL 1 MMOL/ML IV SOLN COMPARISON:  None Available. FINDINGS: Brain: Intracranial and extracranial extension of a calvarial metastases is detailed below. No intraparenchymal metastasis identified. Patchy T2/FLAIR hyperintensities in the white matter are nonspecific but compatible with chronic microvascular ischemic change. No evidence of acute infarct, acute hemorrhage, midline shift or hydrocephalus. Cerebral atrophy. Vascular: Major arterial flow voids are maintained at the skull base. Skull and upper cervical spine: Many areas of abnormal marrow signal and enhancement associated with the calvarium, skull base, imaged jaws, and visualized cervical spine, compatible with osseous metastasis. This includes a right parietal calvarial metastasis that has a soft tissue extracranial and intracranial component. Intracranial enhancement of  abuts the adjacent right parietal lobe. Milder dural thickening enhancement along bilateral frontal convexities, left temporal, and left parietal convexity. Sinuses/Orbits: Clear sinuses. No obvious acute orbital abnormality. Other: No sizable mastoid effusions. IMPRESSION: 1. Numerous bony metastasis throughout the calvarium, skull base, face/jaws, and visualized upper cervical spine. 2. A right parietal bony lesion has a soft tissue component with both extracranial/scalp and intracranial extension of tumor. Tumor abuts the right parietal lobe without brain edema. Multiple additional areas of milder probable dural involvement along both cerebral convexities. 3. Extraosseous extension of tumor involving the jaws (bulky on the right) and likely the sphenoid bones. An MRI of the face and neck with contrast could further characterize if clinically warranted. 4. No intraparenchymal metastatic disease identified.  Electronically Signed   By: Feliberto Harts M.D.   On: 04/20/2023 19:41    Procedures Procedures  {Document cardiac monitor, telemetry assessment procedure when appropriate:1}  Medications Ordered in ED Medications - No data to display  ED Course/ Medical Decision Making/ A&P   {Patient with hemorrhage at the insertion point of the port.  The bleeding has been controlled. Click here for ABCD2, HEART and other calculatorsREFRESH Note before signing :1}                              Medical Decision Making Amount and/or Complexity of Data Reviewed Labs: ordered. Radiology: ordered.   Hematoma from port placement and chest  {Document critical care time when appropriate:1} {Document review of labs and clinical decision tools ie heart score, Chads2Vasc2 etc:1}  {Document your independent review of radiology images, and any outside records:1} {Document your discussion with family members, caretakers, and with consultants:1} {Document social determinants of health affecting pt's care:1} {Document your decision making why or why not admission, treatments were needed:1} Final Clinical Impression(s) / ED Diagnoses Final diagnoses:  Post-op pain    Rx / DC Orders ED Discharge Orders     None

## 2023-04-21 NOTE — Discharge Instructions (Signed)
Keep your appointment for follow-up.  If you have any more problems you should return to [facility]

## 2023-04-23 ENCOUNTER — Inpatient Hospital Stay: Payer: Medicaid Other | Attending: Hematology

## 2023-04-23 ENCOUNTER — Inpatient Hospital Stay: Payer: Medicaid Other

## 2023-04-23 VITALS — BP 129/73 | HR 80 | Temp 97.7°F | Resp 20

## 2023-04-23 DIAGNOSIS — C3411 Malignant neoplasm of upper lobe, right bronchus or lung: Secondary | ICD-10-CM

## 2023-04-23 DIAGNOSIS — Z87891 Personal history of nicotine dependence: Secondary | ICD-10-CM | POA: Insufficient documentation

## 2023-04-23 DIAGNOSIS — Z5112 Encounter for antineoplastic immunotherapy: Secondary | ICD-10-CM | POA: Insufficient documentation

## 2023-04-23 DIAGNOSIS — C7951 Secondary malignant neoplasm of bone: Secondary | ICD-10-CM | POA: Insufficient documentation

## 2023-04-23 DIAGNOSIS — C787 Secondary malignant neoplasm of liver and intrahepatic bile duct: Secondary | ICD-10-CM | POA: Diagnosis not present

## 2023-04-23 DIAGNOSIS — Z5111 Encounter for antineoplastic chemotherapy: Secondary | ICD-10-CM | POA: Insufficient documentation

## 2023-04-23 DIAGNOSIS — Z5189 Encounter for other specified aftercare: Secondary | ICD-10-CM | POA: Diagnosis not present

## 2023-04-23 LAB — COMPREHENSIVE METABOLIC PANEL
ALT: 21 U/L (ref 0–44)
AST: 28 U/L (ref 15–41)
Albumin: 3.5 g/dL (ref 3.5–5.0)
Alkaline Phosphatase: 74 U/L (ref 38–126)
Anion gap: 11 (ref 5–15)
BUN: 12 mg/dL (ref 8–23)
CO2: 31 mmol/L (ref 22–32)
Calcium: 12 mg/dL — ABNORMAL HIGH (ref 8.9–10.3)
Chloride: 88 mmol/L — ABNORMAL LOW (ref 98–111)
Creatinine, Ser: 0.72 mg/dL (ref 0.44–1.00)
GFR, Estimated: >60 mL/min (ref >60)
Glucose, Bld: 95 mg/dL (ref 70–99)
Potassium: 3.3 mmol/L — ABNORMAL LOW (ref 3.5–5.1)
Sodium: 130 mmol/L — ABNORMAL LOW (ref 135–145)
Total Bilirubin: 1.1 mg/dL (ref 0.3–1.2)
Total Protein: 6.2 g/dL — ABNORMAL LOW (ref 6.5–8.1)

## 2023-04-23 LAB — CBC WITH DIFFERENTIAL/PLATELET
Abs Immature Granulocytes: 0.11 10*3/uL — ABNORMAL HIGH (ref 0.00–0.07)
Basophils Absolute: 0 10*3/uL (ref 0.0–0.1)
Basophils Relative: 0 %
Eosinophils Absolute: 0.1 10*3/uL (ref 0.0–0.5)
Eosinophils Relative: 1 %
HCT: 40.5 % (ref 36.0–46.0)
Hemoglobin: 13.6 g/dL (ref 12.0–15.0)
Immature Granulocytes: 1 %
Lymphocytes Relative: 10 %
Lymphs Abs: 1.2 10*3/uL (ref 0.7–4.0)
MCH: 32.5 pg (ref 26.0–34.0)
MCHC: 33.6 g/dL (ref 30.0–36.0)
MCV: 96.7 fL (ref 80.0–100.0)
Monocytes Absolute: 1 10*3/uL (ref 0.1–1.0)
Monocytes Relative: 8 %
Neutro Abs: 9.4 10*3/uL — ABNORMAL HIGH (ref 1.7–7.7)
Neutrophils Relative %: 80 %
Platelets: 190 10*3/uL (ref 150–400)
RBC: 4.19 MIL/uL (ref 3.87–5.11)
RDW: 15.7 % — ABNORMAL HIGH (ref 11.5–15.5)
WBC: 11.8 10*3/uL — ABNORMAL HIGH (ref 4.0–10.5)
nRBC: 0 % (ref 0.0–0.2)

## 2023-04-23 LAB — TSH: TSH: 2.565 u[IU]/mL (ref 0.350–4.500)

## 2023-04-23 LAB — MAGNESIUM: Magnesium: 1.1 mg/dL — ABNORMAL LOW (ref 1.7–2.4)

## 2023-04-23 MED ORDER — SODIUM CHLORIDE 0.9 % IV SOLN: INTRAVENOUS | Status: DC

## 2023-04-23 MED ORDER — SODIUM CHLORIDE 0.9 % IV SOLN
10.0000 mg | Freq: Once | INTRAVENOUS | Status: AC
  Administered 2023-04-23: 10 mg via INTRAVENOUS
  Filled 2023-04-23: qty 10

## 2023-04-23 MED ORDER — MAGNESIUM SULFATE 4 GM/100ML IV SOLN
4.0000 g | Freq: Once | INTRAVENOUS | Status: AC
  Administered 2023-04-23: 4 g via INTRAVENOUS
  Filled 2023-04-23: qty 100

## 2023-04-23 MED ORDER — SODIUM CHLORIDE 0.9% FLUSH
10.0000 mL | INTRAVENOUS | Status: DC | PRN
  Administered 2023-04-23: 10 mL

## 2023-04-23 MED ORDER — HEPARIN SOD (PORK) LOCK FLUSH 100 UNIT/ML IV SOLN
500.0000 [IU] | Freq: Once | INTRAVENOUS | Status: AC | PRN
  Administered 2023-04-23: 500 [IU]

## 2023-04-23 MED ORDER — SODIUM CHLORIDE 0.9 % IV SOLN
340.0000 mg | Freq: Once | INTRAVENOUS | Status: AC
  Administered 2023-04-23: 340 mg via INTRAVENOUS
  Filled 2023-04-23: qty 34

## 2023-04-23 MED ORDER — LIDOCAINE-PRILOCAINE 2.5-2.5 % EX CREA: TOPICAL_CREAM | CUTANEOUS | 3 refills | Status: DC

## 2023-04-23 MED ORDER — SODIUM CHLORIDE 0.9 % IV SOLN
1500.0000 mg | Freq: Once | INTRAVENOUS | Status: AC
  Administered 2023-04-23: 1500 mg via INTRAVENOUS
  Filled 2023-04-23: qty 30

## 2023-04-23 MED ORDER — SODIUM CHLORIDE 0.9 % IV SOLN: Freq: Once | INTRAVENOUS | Status: AC

## 2023-04-23 MED ORDER — SODIUM CHLORIDE 0.9 % IV SOLN
150.0000 mg | Freq: Once | INTRAVENOUS | Status: AC
  Administered 2023-04-23: 150 mg via INTRAVENOUS
  Filled 2023-04-23: qty 150

## 2023-04-23 MED ORDER — ZOLEDRONIC ACID 4 MG/100ML IV SOLN
4.0000 mg | Freq: Once | INTRAVENOUS | Status: AC
  Administered 2023-04-23: 4 mg via INTRAVENOUS
  Filled 2023-04-23: qty 100

## 2023-04-23 MED ORDER — SODIUM CHLORIDE 0.9 % IV SOLN
80.0000 mg/m2 | Freq: Once | INTRAVENOUS | Status: AC
  Administered 2023-04-23: 116 mg via INTRAVENOUS
  Filled 2023-04-23: qty 5.8

## 2023-04-23 MED ORDER — SODIUM CHLORIDE 0.9% FLUSH
10.0000 mL | INTRAVENOUS | Status: AC
  Administered 2023-04-23: 10 mL

## 2023-04-23 MED ORDER — PALONOSETRON HCL INJECTION 0.25 MG/5ML
0.2500 mg | Freq: Once | INTRAVENOUS | Status: AC
  Administered 2023-04-23: 0.25 mg via INTRAVENOUS
  Filled 2023-04-23: qty 5

## 2023-04-23 MED ORDER — POTASSIUM CHLORIDE CRYS ER 20 MEQ PO TBCR
40.0000 meq | EXTENDED_RELEASE_TABLET | Freq: Once | ORAL | Status: AC
  Administered 2023-04-23: 40 meq via ORAL
  Filled 2023-04-23: qty 2

## 2023-04-23 NOTE — Patient Instructions (Signed)
[facility]  Discharge Instructions: Thank you for choosing [facility] to provide your oncology and hematology care.  If you have a lab appointment with the Cancer Center - please note that after April 8th, 2024, all labs will be drawn in the cancer center.  You do not have to check in or register with the main entrance as you have in the past but will complete your check-in in the cancer center.  Wear comfortable clothing and clothing appropriate for easy access to any Portacath or PICC line.   We strive to give you quality time with your provider. You may need to reschedule your appointment if you arrive late (15 or more minutes).  Arriving late affects you and other patients whose appointments are after yours.  Also, if you miss three or more appointments without notifying the office, you may be dismissed from the clinic at the provider's discretion.      For prescription refill requests, have your pharmacy contact our office and allow 72 hours for refills to be completed.    Today you received the following chemotherapy and/or immunotherapy agents Etoposide/Carboplatin/Imfinzi/Zometa/Magnesium sulfate/Potassium PO      To help prevent nausea and vomiting after your treatment, we encourage you to take your nausea medication as directed.  BELOW ARE SYMPTOMS THAT SHOULD BE REPORTED IMMEDIATELY: *FEVER GREATER THAN 100.4 F (38 C) OR HIGHER *CHILLS OR SWEATING *NAUSEA AND VOMITING THAT IS NOT CONTROLLED WITH YOUR NAUSEA MEDICATION *UNUSUAL SHORTNESS OF BREATH *UNUSUAL BRUISING OR BLEEDING *URINARY PROBLEMS (pain or burning when urinating, or frequent urination) *BOWEL PROBLEMS (unusual diarrhea, constipation, pain near the anus) TENDERNESS IN MOUTH AND THROAT WITH OR WITHOUT PRESENCE OF ULCERS (sore throat, sores in mouth, or a toothache) UNUSUAL RASH, SWELLING OR PAIN  UNUSUAL VAGINAL DISCHARGE OR ITCHING   Items with * indicate a potential emergency  and should be followed up as soon as possible or go to the Emergency Department if any problems should occur.  Please show the CHEMOTHERAPY ALERT CARD or IMMUNOTHERAPY ALERT CARD at check-in to the Emergency Department and triage nurse.  Should you have questions after your visit or need to cancel or reschedule your appointment, please contact [facility] CENTER AT [facility] [phone]  and follow the prompts.  Office hours are 8:00 a.m. to 4:30 p.m. Monday - Friday. Please note that voicemails left after 4:00 p.m. may not be returned until the following business day.  We are closed weekends and major holidays. You have access to a nurse at all times for urgent questions. Please call the main number to the clinic [phone] and follow the prompts.  For any non-urgent questions, you may also contact your provider using MyChart. We now offer e-Visits for anyone 48 and older to request care online for non-urgent symptoms. For details visit [email or web address].   Also download the MyChart app! Go to the app store, search "MyChart", open the app, select [place], and log in with your MyChart username and password.

## 2023-04-23 NOTE — Progress Notes (Signed)
Patient presents today for D1C1 Imfinzi/Carboplatin/Etoposide infusion per providers order.  Labs and vital signs within parameters for treatment.  PO Potassium IV Magnesium/Zometa per providers order.  Treatment given today per MD orders.  Stable during infusion without adverse affects.  Vital signs stable.  No complaints at this time.  Discharge from clinic ambulatory in stable condition.  Alert and oriented X 3.  Follow up with [facility] as scheduled.

## 2023-04-23 NOTE — Progress Notes (Signed)
Chemotherapy and immunotherapy education packet given and discussed with pt and family in detail. Discussed diagnosis and staging, tx regimen, and intent of tx. Reviewed chemotherapy and immunotherapy medications and side effects, as well as pre-medications. Instructed on how to manage side effects at home, and when to call the clinic. Importance of fever/chills discussed with pt and family. Discussed precautions to implement at home after receiving tx, as well as self care strategies. Phone numbers provided for clinic during regular working hours, also how to reach the clinic after hours and on weekends. Pt and family provided the opportunity to ask questions - all questions answered to pt's and family satisfaction.

## 2023-04-24 ENCOUNTER — Inpatient Hospital Stay: Payer: Medicaid Other

## 2023-04-24 VITALS — BP 158/84 | HR 80 | Temp 97.3°F | Resp 19

## 2023-04-24 DIAGNOSIS — C3411 Malignant neoplasm of upper lobe, right bronchus or lung: Secondary | ICD-10-CM

## 2023-04-24 DIAGNOSIS — Z5112 Encounter for antineoplastic immunotherapy: Secondary | ICD-10-CM | POA: Diagnosis not present

## 2023-04-24 LAB — T4: T4, Total: 7.3 ug/dL (ref 4.5–12.0)

## 2023-04-24 MED ORDER — SODIUM CHLORIDE 0.9 % IV SOLN
10.0000 mg | Freq: Once | INTRAVENOUS | Status: AC
  Administered 2023-04-24: 10 mg via INTRAVENOUS
  Filled 2023-04-24: qty 10

## 2023-04-24 MED ORDER — SODIUM CHLORIDE 0.9 % IV SOLN: Freq: Once | INTRAVENOUS | Status: AC

## 2023-04-24 MED ORDER — HEPARIN SOD (PORK) LOCK FLUSH 100 UNIT/ML IV SOLN
500.0000 [IU] | Freq: Once | INTRAVENOUS | Status: AC | PRN
  Administered 2023-04-24: 500 [IU]

## 2023-04-24 MED ORDER — SODIUM CHLORIDE 0.9 % IV SOLN
80.0000 mg/m2 | Freq: Once | INTRAVENOUS | Status: AC
  Administered 2023-04-24: 116 mg via INTRAVENOUS
  Filled 2023-04-24: qty 5.8

## 2023-04-24 MED ORDER — SODIUM CHLORIDE 0.9% FLUSH
10.0000 mL | INTRAVENOUS | Status: DC | PRN
  Administered 2023-04-24: 10 mL

## 2023-04-24 NOTE — Progress Notes (Signed)
Patient presents today for D2 Etoposide infusion.  Patient is in satisfactory condition with no new complaints voiced.  Vital signs are stable.  Labs reviewed from 04/23/2023 and all labs are within treatment parameters.  We will proceed with treatment per MD orders.    Treatment given today per MD orders. Tolerated infusion without adverse affects. Vital signs stable. No complaints at this time. Discharged from clinic via wheelchair in stable condition. Alert and oriented x 3. F/U with [facility] as scheduled.

## 2023-04-24 NOTE — Patient Instructions (Signed)
[facility]  Discharge Instructions: Thank you for choosing [facility] to provide your oncology and hematology care.  If you have a lab appointment with the Cancer Center - please note that after April 8th, 2024, all labs will be drawn in the cancer center.  You do not have to check in or register with the main entrance as you have in the past but will complete your check-in in the cancer center.  Wear comfortable clothing and clothing appropriate for easy access to any Portacath or PICC line.   We strive to give you quality time with your provider. You may need to reschedule your appointment if you arrive late (15 or more minutes).  Arriving late affects you and other patients whose appointments are after yours.  Also, if you miss three or more appointments without notifying the office, you may be dismissed from the clinic at the provider's discretion.      For prescription refill requests, have your pharmacy contact our office and allow 72 hours for refills to be completed.    Today you received the following chemotherapy and/or immunotherapy agents D2 VP16   To help prevent nausea and vomiting after your treatment, we encourage you to take your nausea medication as directed.  Etoposide Injection What is this medication? ETOPOSIDE (e toe POE side) treats some types of cancer. It works by slowing down the growth of cancer cells. This medicine may be used for other purposes; ask your health care provider or pharmacist if you have questions. COMMON BRAND NAME(S): Etopophos, Toposar, VePesid What should I tell my care team before I take this medication? They need to know if you have any of these conditions: Infection Kidney disease Liver disease Low blood counts, such as low white cell, platelet, red cell counts An unusual or allergic reaction to etoposide, other medications, foods, dyes, or preservatives If you or your partner are pregnant or trying to  get pregnant Breastfeeding How should I use this medication? This medication is injected into a vein. It is given by your care team in a hospital or clinic setting. Talk to your care team about the use of this medication in children. Special care may be needed. Overdosage: If you think you have taken too much of this medicine contact a poison control center or emergency room at once. NOTE: This medicine is only for you. Do not share this medicine with others. What if I miss a dose? Keep appointments for follow-up doses. It is important not to miss your dose. Call your care team if you are unable to keep an appointment. What may interact with this medication? Warfarin This list may not describe all possible interactions. Give your health care provider a list of all the medicines, herbs, non-prescription drugs, or dietary supplements you use. Also tell them if you smoke, drink alcohol, or use illegal drugs. Some items may interact with your medicine. What should I watch for while using this medication? Your condition will be monitored carefully while you are receiving this medication. This medication may make you feel generally unwell. This is not uncommon as chemotherapy can affect healthy cells as well as cancer cells. Report any side effects. Continue your course of treatment even though you feel ill unless your care team tells you to stop. This medication can cause serious side effects. To reduce the risk, your care team may give you other medications to take before receiving this one. Be sure to follow the directions from  your care team. This medication may increase your risk of getting an infection. Call your care team for advice if you get a fever, chills, sore throat, or other symptoms of a cold or flu. Do not treat yourself. Try to avoid being around people who are sick. This medication may increase your risk to bruise or bleed. Call your care team if you notice any unusual bleeding. Talk to  your care team about your risk of cancer. You may be more at risk for certain types of cancers if you take this medication. Talk to your care team if you may be pregnant. Serious birth defects can occur if you take this medication during pregnancy and for 6 months after the last dose. You will need a negative pregnancy test before starting this medication. Contraception is recommended while taking this medication and for 6 months after the last dose. Your care team can help you find the option that works for you. If your partner can get pregnant, use a condom during sex while taking this medication and for 4 months after the last dose. Do not breastfeed while taking this medication. This medication may cause infertility. Talk to your care team if you are concerned about your fertility. What side effects may I notice from receiving this medication? Side effects that you should report to your care team as soon as possible: Allergic reactions--skin rash, itching, hives, swelling of the face, lips, tongue, or throat Infection--fever, chills, cough, sore throat, wounds that don't heal, pain or trouble when passing urine, general feeling of discomfort or being unwell Low red blood cell level--unusual weakness or fatigue, dizziness, headache, trouble breathing Unusual bruising or bleeding Side effects that usually do not require medical attention (report to your care team if they continue or are bothersome): Diarrhea Fatigue Hair loss Loss of appetite Nausea Vomiting This list may not describe all possible side effects. Call your doctor for medical advice about side effects. You may report side effects to FDA at 1-800-FDA-1088. Where should I keep my medication? This medication is given in a hospital or clinic. It will not be stored at home. NOTE: This sheet is a summary. It may not cover all possible information. If you have questions about this medicine, talk to your doctor, pharmacist, or health  care provider.  2024 Elsevier/Gold Standard (2021-12-28 00:00:00)   BELOW ARE SYMPTOMS THAT SHOULD BE REPORTED IMMEDIATELY: *FEVER GREATER THAN 100.4 F (38 C) OR HIGHER *CHILLS OR SWEATING *NAUSEA AND VOMITING THAT IS NOT CONTROLLED WITH YOUR NAUSEA MEDICATION *UNUSUAL SHORTNESS OF BREATH *UNUSUAL BRUISING OR BLEEDING *URINARY PROBLEMS (pain or burning when urinating, or frequent urination) *BOWEL PROBLEMS (unusual diarrhea, constipation, pain near the anus) TENDERNESS IN MOUTH AND THROAT WITH OR WITHOUT PRESENCE OF ULCERS (sore throat, sores in mouth, or a toothache) UNUSUAL RASH, SWELLING OR PAIN  UNUSUAL VAGINAL DISCHARGE OR ITCHING   Items with * indicate a potential emergency and should be followed up as soon as possible or go to the Emergency Department if any problems should occur.  Please show the CHEMOTHERAPY ALERT CARD or IMMUNOTHERAPY ALERT CARD at check-in to the Emergency Department and triage nurse.  Should you have questions after your visit or need to cancel or reschedule your appointment, please contact [facility] CENTER AT [facility] [phone]  and follow the prompts.  Office hours are 8:00 a.m. to 4:30 p.m. Monday - Friday. Please note that voicemails left after 4:00 p.m. may not be returned until the following business day.  We are  closed weekends and major holidays. You have access to a nurse at all times for urgent questions. Please call the main number to the clinic [phone] and follow the prompts.  For any non-urgent questions, you may also contact your provider using MyChart. We now offer e-Visits for anyone 16 and older to request care online for non-urgent symptoms. For details visit [email or web address].   Also download the MyChart app! Go to the app store, search "MyChart", open the app, select [place], and log in with your MyChart username and password.

## 2023-04-25 ENCOUNTER — Inpatient Hospital Stay: Payer: Medicaid Other

## 2023-04-25 ENCOUNTER — Inpatient Hospital Stay: Payer: Medicaid Other | Admitting: Dietician

## 2023-04-25 ENCOUNTER — Inpatient Hospital Stay: Payer: Medicaid Other | Admitting: Licensed Clinical Social Worker

## 2023-04-25 ENCOUNTER — Encounter: Payer: Self-pay | Admitting: Hematology

## 2023-04-25 ENCOUNTER — Other Ambulatory Visit: Payer: Self-pay

## 2023-04-25 VITALS — BP 139/78 | HR 75 | Temp 96.7°F | Resp 20

## 2023-04-25 DIAGNOSIS — C3411 Malignant neoplasm of upper lobe, right bronchus or lung: Secondary | ICD-10-CM

## 2023-04-25 DIAGNOSIS — Z5112 Encounter for antineoplastic immunotherapy: Secondary | ICD-10-CM | POA: Diagnosis not present

## 2023-04-25 LAB — CYTOLOGY - NON PAP

## 2023-04-25 MED ORDER — SODIUM CHLORIDE 0.9 % IV SOLN
80.0000 mg/m2 | Freq: Once | INTRAVENOUS | Status: AC
  Administered 2023-04-25: 116 mg via INTRAVENOUS
  Filled 2023-04-25: qty 5.8

## 2023-04-25 MED ORDER — SODIUM CHLORIDE 0.9 % IV SOLN
10.0000 mg | Freq: Once | INTRAVENOUS | Status: AC
  Administered 2023-04-25: 10 mg via INTRAVENOUS
  Filled 2023-04-25: qty 10

## 2023-04-25 MED ORDER — SODIUM CHLORIDE 0.9 % IV SOLN: Freq: Once | INTRAVENOUS | Status: AC

## 2023-04-25 MED ORDER — SODIUM CHLORIDE 0.9% FLUSH
10.0000 mL | INTRAVENOUS | Status: DC | PRN
  Administered 2023-04-25: 10 mL

## 2023-04-25 MED ORDER — HEPARIN SOD (PORK) LOCK FLUSH 100 UNIT/ML IV SOLN
500.0000 [IU] | Freq: Once | INTRAVENOUS | Status: AC | PRN
  Administered 2023-04-25: 500 [IU]

## 2023-04-25 NOTE — Progress Notes (Signed)
[facility] Clinical Social Work  Initial Assessment   Kathleen Bennett is a 62 y.o. year old female accompanied by patient and daughter. Clinical Social Work was referred by medical provider for assessment of psychosocial needs.   SDOH (Social Determinants of Health) assessments performed: Yes   SDOH Screenings   Food Insecurity: Food Insecurity Present (04/03/2023)  Housing: Low Risk  (04/03/2023)  Transportation Needs: No Transportation Needs (04/03/2023)  Utilities: Not At Risk (04/03/2023)  Depression (PHQ2-9): Low Risk  (04/18/2023)  Tobacco Use: Medium Risk (04/21/2023)     Distress Screen completed: No     No data to display            Family/Social Information:  Housing Arrangement: patient lives with her husband.    Pt was discharged from the hospital in August with home oxygen through [employer].  Pt recently acquired a walker which she uses for ambulation.  Pt requires assistance w/ most ADLs.   Family members/support persons in your life? Pt's daughter, Kathleen Bennett, resides about 10 miles from pt and provides support as she is able. Transportation concerns: no  Employment: Disabled pt worked at a [employer] safety [employer] prior to 2017 when she stopped due to disability.  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 yes Concerns about diagnosis and/or treatment: Overwhelmed by information and Quality of life Patient reported stressors: Finances, Adjusting to my illness, and Physical issues Hopes and/or priorities: pt's priority is to continue treatment w/ the hope of positive results Patient enjoys  not addressed Current coping skills/ strengths: Motivation for treatment/growth  and Supportive  family/friends     SUMMARY: Current SDOH Barriers:  Financial constraints related to fixed income and Limited social support  Clinical Social Work Clinical Goal(s):  Explore community resource options for unmet needs related to:  Financial Strain   Interventions: Discussed common feeling and emotions when being diagnosed with cancer, and the importance of support during treatment Informed patient of the support team roles and support services at [facility] Provided CSW contact information and encouraged patient to call with any questions or concerns Referred patient to Marijean Niemann, provided w/ contact information for the [employer] to acquire additional DME, signed acknowledgement for Adela Ports and will bring in proof of income at a later visit.   Follow Up Plan: Patient will contact CSW with any support or resource needs Patient verbalizes understanding of plan: Yes    Rachel Moulds, LCSW Clinical Social Worker [facility]  Patient is participating in a Managed Medicaid Plan:  Yes

## 2023-04-25 NOTE — Progress Notes (Signed)
Nutrition Assessment  Reason for Assessment: Provider referral (wt loss)   ASSESSMENT: 62 year old female with extensive stage small cell lung cancer. Pt receiving durvalumab/carboplatin + etoposide q21d. Patient is under the care of Dr. Ellin Saba.   Past medical history includes COPD with O2 dependency, GERD, HTN, HLD  Met with patient and daughter in infusion. She reports ongoing constipation. Pt unsure of last BM. She is currently taking lactulose once daily. Pt has a poor appetite. Daughter says pt is a picky eater. Pt eating 2 small meals most days. Recalls oatmeal or yogurt for breakfast. Likes chicken pot pie, fish sandwiches for dinner. She will snack on fruit, cheese, bacon. Pt drinks Boost, but not everyday.   Nutrition Focused Physical Exam: deferred    Medications: lasix, lidocaine, megace, MVI, prilosec, paxil, klor-con, compazine, lactulose   Labs: Na 130, K 3.3, Mg 1.1   Anthropometrics: Weights have decreased 11% from 118 lb on 8/13 - severe for time frame  Height: 5'4" Weight: 105 lb 6.4 oz  UBW: 131 lb (Feb 2023) BMI: 18.09   NUTRITION DIAGNOSIS: Inadequate oral intake related to cancer, increased work of breathing, COPD with O2 dependency, as evidenced by 11% wt loss in 3 weeks - severe for time frame   INTERVENTION:  Educated on small frequent meals vs 2 larger portions Discussed soft moist easy to chew/swallow for ease of intake - soft moist high protein foods list provided Continue drinking Boost Plus/equivalent - recommend increasing 2/day (suggested drinking half ONS QID to reduce early satiety) samples of Ensure + coupons provided Reviewed instructions for starting lactulose per MD - pt will start 30 ml q3h until BM followed by 30 ml lactulose 1-2x/day    MONITORING, EVALUATION, GOAL: wt trends, intake   Next Visit: Thursday October 17 during infusion

## 2023-04-25 NOTE — Progress Notes (Signed)
Patient presents today for D3 Etoposide infusion. Patient is in satisfactory condition with no new complaints voiced.  Vital signs are stable.    Treatment given today per MD orders. Tolerated infusion without adverse affects. Vital signs stable. No complaints at this time. Discharged from clinic ambulatory in stable condition. Alert and oriented x 3. F/U with [facility] as scheduled.

## 2023-04-25 NOTE — Patient Instructions (Signed)
[facility]  Discharge Instructions: Thank you for choosing  Cancer Center to provide your oncology and hematology care.  If you have a lab appointment with the Cancer Center - please note that after April 8th, 2024, all labs will be drawn in the cancer center.  You do not have to check in or register with the main entrance as you have in the past but will complete your check-in in the cancer center.  Wear comfortable clothing and clothing appropriate for easy access to any Portacath or PICC line.   We strive to give you quality time with your provider. You may need to reschedule your appointment if you arrive late (15 or more minutes).  Arriving late affects you and other patients whose appointments are after yours.  Also, if you miss three or more appointments without notifying the office, you may be dismissed from the clinic at the provider's discretion.      For prescription refill requests, have your pharmacy contact our office and allow 72 hours for refills to be completed.    Today you received the following chemotherapy and/or immunotherapy agents Etoposide   To help prevent nausea and vomiting after your treatment, we encourage you to take your nausea medication as directed.  Etoposide Injection What is this medication? ETOPOSIDE (e toe POE side) treats some types of cancer. It works by slowing down the growth of cancer cells. This medicine may be used for other purposes; ask your health care provider or pharmacist if you have questions. COMMON BRAND NAME(S): Etopophos, Toposar, VePesid What should I tell my care team before I take this medication? They need to know if you have any of these conditions: Infection Kidney disease Liver disease Low blood counts, such as low white cell, platelet, red cell counts An unusual or allergic reaction to etoposide, other medications, foods, dyes, or preservatives If you or your partner are pregnant or trying to  get pregnant Breastfeeding How should I use this medication? This medication is injected into a vein. It is given by your care team in a hospital or clinic setting. Talk to your care team about the use of this medication in children. Special care may be needed. Overdosage: If you think you have taken too much of this medicine contact a poison control center or emergency room at once. NOTE: This medicine is only for you. Do not share this medicine with others. What if I miss a dose? Keep appointments for follow-up doses. It is important not to miss your dose. Call your care team if you are unable to keep an appointment. What may interact with this medication? Warfarin This list may not describe all possible interactions. Give your health care provider a list of all the medicines, herbs, non-prescription drugs, or dietary supplements you use. Also tell them if you smoke, drink alcohol, or use illegal drugs. Some items may interact with your medicine. What should I watch for while using this medication? Your condition will be monitored carefully while you are receiving this medication. This medication may make you feel generally unwell. This is not uncommon as chemotherapy can affect healthy cells as well as cancer cells. Report any side effects. Continue your course of treatment even though you feel ill unless your care team tells you to stop. This medication can cause serious side effects. To reduce the risk, your care team may give you other medications to take before receiving this one. Be sure to follow the directions from your  care team. This medication may increase your risk of getting an infection. Call your care team for advice if you get a fever, chills, sore throat, or other symptoms of a cold or flu. Do not treat yourself. Try to avoid being around people who are sick. This medication may increase your risk to bruise or bleed. Call your care team if you notice any unusual bleeding. Talk to  your care team about your risk of cancer. You may be more at risk for certain types of cancers if you take this medication. Talk to your care team if you may be pregnant. Serious birth defects can occur if you take this medication during pregnancy and for 6 months after the last dose. You will need a negative pregnancy test before starting this medication. Contraception is recommended while taking this medication and for 6 months after the last dose. Your care team can help you find the option that works for you. If your partner can get pregnant, use a condom during sex while taking this medication and for 4 months after the last dose. Do not breastfeed while taking this medication. This medication may cause infertility. Talk to your care team if you are concerned about your fertility. What side effects may I notice from receiving this medication? Side effects that you should report to your care team as soon as possible: Allergic reactions--skin rash, itching, hives, swelling of the face, lips, tongue, or throat Infection--fever, chills, cough, sore throat, wounds that don't heal, pain or trouble when passing urine, general feeling of discomfort or being unwell Low red blood cell level--unusual weakness or fatigue, dizziness, headache, trouble breathing Unusual bruising or bleeding Side effects that usually do not require medical attention (report to your care team if they continue or are bothersome): Diarrhea Fatigue Hair loss Loss of appetite Nausea Vomiting This list may not describe all possible side effects. Call your doctor for medical advice about side effects. You may report side effects to FDA at 1-800-FDA-1088. Where should I keep my medication? This medication is given in a hospital or clinic. It will not be stored at home. NOTE: This sheet is a summary. It may not cover all possible information. If you have questions about this medicine, talk to your doctor, pharmacist, or health  care provider.  2024 Elsevier/Gold Standard (2021-12-28 00:00:00)   BELOW ARE SYMPTOMS THAT SHOULD BE REPORTED IMMEDIATELY: *FEVER GREATER THAN 100.4 F (38 C) OR HIGHER *CHILLS OR SWEATING *NAUSEA AND VOMITING THAT IS NOT CONTROLLED WITH YOUR NAUSEA MEDICATION *UNUSUAL SHORTNESS OF BREATH *UNUSUAL BRUISING OR BLEEDING *URINARY PROBLEMS (pain or burning when urinating, or frequent urination) *BOWEL PROBLEMS (unusual diarrhea, constipation, pain near the anus) TENDERNESS IN MOUTH AND THROAT WITH OR WITHOUT PRESENCE OF ULCERS (sore throat, sores in mouth, or a toothache) UNUSUAL RASH, SWELLING OR PAIN  UNUSUAL VAGINAL DISCHARGE OR ITCHING   Items with * indicate a potential emergency and should be followed up as soon as possible or go to the Emergency Department if any problems should occur.  Please show the CHEMOTHERAPY ALERT CARD or IMMUNOTHERAPY ALERT CARD at check-in to the Emergency Department and triage nurse.  Should you have questions after your visit or need to cancel or reschedule your appointment, please contact [facility] [phone]  and follow the prompts.  Office hours are 8:00 a.m. to 4:30 p.m. Monday - Friday. Please note that voicemails left after 4:00 p.m. may not be returned until the following business day.  We are closed  weekends and major holidays. You have access to a nurse at all times for urgent questions. Please call the main number to the clinic [phone] and follow the prompts.  For any non-urgent questions, you may also contact your provider using MyChart. We now offer e-Visits for anyone 49 and older to request care online for non-urgent symptoms. For details visit [email or web address].   Also download the MyChart app! Go to the app store, search "MyChart", open the app, select [place], and log in with your MyChart username and password.

## 2023-04-26 ENCOUNTER — Inpatient Hospital Stay: Payer: Medicaid Other | Admitting: Acute Care

## 2023-04-26 ENCOUNTER — Other Ambulatory Visit: Payer: Self-pay

## 2023-04-26 ENCOUNTER — Inpatient Hospital Stay: Payer: Medicaid Other

## 2023-04-26 VITALS — BP 136/83 | HR 86 | Temp 97.1°F | Resp 18

## 2023-04-26 DIAGNOSIS — C3490 Malignant neoplasm of unspecified part of unspecified bronchus or lung: Secondary | ICD-10-CM

## 2023-04-26 DIAGNOSIS — C3411 Malignant neoplasm of upper lobe, right bronchus or lung: Secondary | ICD-10-CM

## 2023-04-26 DIAGNOSIS — Z5112 Encounter for antineoplastic immunotherapy: Secondary | ICD-10-CM | POA: Diagnosis not present

## 2023-04-26 MED ORDER — PEGFILGRASTIM-CBQV 6 MG/0.6ML ~~LOC~~ SOSY
6.0000 mg | PREFILLED_SYRINGE | Freq: Once | SUBCUTANEOUS | Status: AC
  Administered 2023-04-26: 6 mg via SUBCUTANEOUS
  Filled 2023-04-26: qty 0.6

## 2023-04-26 NOTE — Progress Notes (Signed)
Patient presents today for Udenyca injection per providers order.  Vital signs WNL.  Patient has no new complaints at this time.  Stable during administration without incident; injection site WNL; see MAR for injection details.  Patient tolerated procedure well and without incident.  No questions or complaints noted at this time.

## 2023-04-26 NOTE — Patient Instructions (Signed)
[facility] [place]  Discharge Instructions: Thank you for choosing [facility] to provide your oncology and hematology care.  If you have a lab appointment with the Cancer Center - please note that after April 8th, 2024, all labs will be drawn in the cancer center.  You do not have to check in or register with the main entrance as you have in the past but will complete your check-in in the cancer center.  Wear comfortable clothing and clothing appropriate for easy access to any Portacath or PICC line.   We strive to give you quality time with your provider. You may need to reschedule your appointment if you arrive late (15 or more minutes).  Arriving late affects you and other patients whose appointments are after yours.  Also, if you miss three or more appointments without notifying the office, you may be dismissed from the clinic at the provider's discretion.      For prescription refill requests, have your pharmacy contact our office and allow 72 hours for refills to be completed.    Today you received the following chemotherapy and/or immunotherapy agents Udenyca      To help prevent nausea and vomiting after your treatment, we encourage you to take your nausea medication as directed.  BELOW ARE SYMPTOMS THAT SHOULD BE REPORTED IMMEDIATELY: *FEVER GREATER THAN 100.4 F (38 C) OR HIGHER *CHILLS OR SWEATING *NAUSEA AND VOMITING THAT IS NOT CONTROLLED WITH YOUR NAUSEA MEDICATION *UNUSUAL SHORTNESS OF BREATH *UNUSUAL BRUISING OR BLEEDING *URINARY PROBLEMS (pain or burning when urinating, or frequent urination) *BOWEL PROBLEMS (unusual diarrhea, constipation, pain near the anus) TENDERNESS IN MOUTH AND THROAT WITH OR WITHOUT PRESENCE OF ULCERS (sore throat, sores in mouth, or a toothache) UNUSUAL RASH, SWELLING OR PAIN  UNUSUAL VAGINAL DISCHARGE OR ITCHING   Items with * indicate a potential emergency and should be followed up as soon as possible or go to the  Emergency Department if any problems should occur.  Please show the CHEMOTHERAPY ALERT CARD or IMMUNOTHERAPY ALERT CARD at check-in to the Emergency Department and triage nurse.  Should you have questions after your visit or need to cancel or reschedule your appointment, please contact [facility] [address] [phone]  and follow the prompts.  Office hours are 8:00 a.m. to 4:30 p.m. Monday - Friday. Please note that voicemails left after 4:00 p.m. may not be returned until the following business day.  We are closed weekends and major holidays. You have access to a nurse at all times for urgent questions. Please call the main number to the clinic [phone] and follow the prompts.  For any non-urgent questions, you may also contact your provider using MyChart. We now offer e-Visits for anyone 18 and older to request care online for non-urgent symptoms. For details visit [email or web address].   Also download the MyChart app! Go to the app store, search "MyChart", open the app, select [place], and log in with your MyChart username and password.   

## 2023-04-30 ENCOUNTER — Telehealth: Payer: Self-pay | Admitting: Hematology

## 2023-04-30 ENCOUNTER — Encounter: Payer: Self-pay | Admitting: Hematology

## 2023-04-30 ENCOUNTER — Encounter (HOSPITAL_COMMUNITY): Payer: Self-pay

## 2023-04-30 NOTE — Telephone Encounter (Signed)
Pt has been approved and enrolled into the [employer]. Spoke with pts daughter and informed her of the guidelines

## 2023-05-01 NOTE — Progress Notes (Signed)
Reviewed   Thanks,  BLI  Josephine Igo, DO [facility] 05/01/2023 5:24 PM

## 2023-05-01 NOTE — Progress Notes (Incomplete)
[facility] [address] [place] [address]   Clinic Day:  05/01/23   Referring physician: Juel Burrow, NP  Patient Care Team: Hyler, Janine Limbo, NP as PCP - General (Nephrology) Doreatha Massed, MD as Medical Oncologist (Medical Oncology) Therese Sarah, RN as Oncology Nurse Navigator (Medical Oncology)   ASSESSMENT & PLAN:   Assessment:  1.  Extensive stage small cell lung cancer: - PET scan (04/11/2023): Right upper lobe mass 6 x 3.2 cm SUV 10.1.  Right upper lobe subpleural 1.7 cm nodule SUV 4.5.  2.6 cm right hilar lymph node SUV 9.0.  Low right paratracheal node 0.7 cm SUV 2.3.  Numerous hypermetabolic bone lesions in the bilateral pelvic girdle, spine and ribs and right mandible. - Bronchoscopy and 10R lymph node FNA: Small cell carcinoma.  Positive for synaptophysin, chromogranin and TTF-1.  Ki-67> 60%. - Weight loss 30 pounds / 2 months.  2. Social/Family History: -She lives at home with her husband and her daughter lives close by, about 10 minutes away. She is not ADLS independent. 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 No tobacco use since 04/02/23 and used to smoke 2 ppd since age 62.  -Brother had bone cancer. Maternal uncle had brain cancer. Another maternal uncle had cancer, type unknown. Maternal grandmother had uterine cancer. Mother had uterine cancer. Daughter has uterine cancer  Plan:  1.  Extensive stage small cell lung cancer: - I discussed scan and biopsy results in detail. - We talked about prognosis and treatment options with combination chemoimmunotherapy.  We also discussed best supportive care in the form of hospice.  Patient prefers active therapy. - Recommend brain MRI with and without contrast to complete staging. - Recommend port placement. - We discussed chemotherapy regimen with carboplatin, etoposide and durvalumab.  We discussed side effects in detail.  2.  Low  back pain: - We will start her on tramadol 50 mg every 6 hours as needed.  3.  Severe constipation: - We will start her on lactulose.  She will take 30 mL every 3 hours until BM followed by lactulose 30 mL 1-2 times daily as maintenance.  4.  Nausea/vomiting: - Will start her on Compazine 10 mg every 6 hours as needed.  5.  Weight loss/loss of appetite: - She lost 30 pounds in the last 2 months.  Reports decreased appetite. - Will start her on Megace 400 mg twice daily.  No orders of the defined types were placed in this encounter.     Kathleen Bennett,acting as a Neurosurgeon for Doreatha Massed, MD.,have documented all relevant documentation on the behalf of Doreatha Massed, MD,as directed by  Doreatha Massed, MD while in the presence of Doreatha Massed, MD.  ***   Kathleen Bennett   9/11/20248:01 PM  CHIEF COMPLAINT/PURPOSE OF CONSULT:   Diagnosis: Extensive stage small cell lung cancer   Cancer Staging  Small cell lung cancer, right upper lobe [facility]) Staging form: Lung, AJCC 8th Edition - Clinical stage from 04/18/2023: Stage IVB (cT3, cN2, cM1c) - Signed by Doreatha Massed, MD on 04/18/2023    Prior Therapy: None  Current Therapy: Chemoimmunotherapy   HISTORY OF PRESENT ILLNESS:   Oncology History  Small cell lung cancer, right upper lobe (HCC)  04/18/2023 Initial Diagnosis   Small cell lung cancer, right upper lobe (HCC)   04/18/2023 Cancer Staging   Staging form: Lung, AJCC 8th Edition - Clinical stage  from 04/18/2023: Stage IVB (cT3, cN2, cM1c) - Signed by Doreatha Massed, MD on 04/18/2023 Histopathologic type: Small cell carcinoma, NOS Stage prefix: Initial diagnosis   04/23/2023 -  Chemotherapy   Patient is on Treatment Plan : LUNG SMALL CELL EXTENSIVE STAGE Durvalumab + Carboplatin D1 + Etoposide D1-3 q21d x 4 Cycles / Durvalumab q28d         Kathleen Bennett is a 62 y.o. female presenting to clinic today for evaluation of a lung mass at the  request of Hyler, Janine Limbo, NP.  Patient was admitted to the ED on 04/02/23 for acute hypoxemic respiratory failure. She was put on 3 L of O2.  CT of the chest found: a lobulated mass with surrounding satellite nodules in the right upper lobe measuring 3.9 x 6.7 cm and focal pleural thickening in the right upper lobe with questionable erosions of the adjacent T5 rib on the right, possible for metastatic disease. She then had a bronchoscopy with endobronchial [place] on 04/16/23 with Dr. Tonia Brooms. Cytology results are pending. She had a PET on 04/11/23 that found: hypermetabolic irregular solid right upper lobe 6.0 cm lung mass with numerous surrounding satellite pulmonary nodules, compatible with primary bronchogenic malignancy; hypermetabolic solid peripheral right upper lobe subpleural 1.7 cm pulmonary nodule, compatible with intralobar pulmonary metastasis; hypermetabolic metastatic right hilar adenopathy; low level hypermetabolism within a small 0.7 cm low right paratracheal lymph node, suspicious for ipsilateral mediastinal nodal metastasis; widespread hypermetabolic osseous metastases scattered throughout the axial skeleton; and tiny focus of hypermetabolism in the far inferior right liver without discrete correlate on the noncontrast CT images, indeterminate for early liver metastasis versus artifact.   Today, she states that she is doing well overall. Her appetite level is at 30-40%. Her energy level is at 30%. She is accompanied by her daughter.   She notes since 12/31/22 her top lip and chin are numb. She also reports toothpaste removed the skin out of her mouth and she has to get all of her teeth removed. She then went to the ED. She is currently on 2.5 L of O2 and has been since 04/02/23. Her daughter notes 31 pound weight loss over the 2 months with decreased appetite. She reports blurry vision changes and does not drive. She reports she needs assistance going to bathroom, taking baths, cooking meals, and  standing up and sitting down. She is able to clothe herself without assistance. She can walk short distances by herself, and got a walker yesterday, but cannot go down the stairs without assistance due to feeling unbalanced.   She c/o lower back pain, abdominal pain, and right arm pain since hospitalization with a 7/10 severity. She has trouble sleeping due to the pain. Her PCP  has not prescribed her pain medication, nor is she currently taking any medication for the pain. Her daughter notes she had an incident many years ago that crushed her left shoulder and received pain medication at that time. She occasionally has nausea and vomited yesterday. She drinks 2 regular Boosts a day and has a diet of soft foods. She would like to receive medication that increases medication.   She has constipation and is taking a stool softener that does not improve it. She cannot remember the last time she had a BM. She has been passing gas.   She has osteoarthrosis, but denies autoimmune diseases, including rheumatoid arthritis and lupus. She wishes to proceed with treatment and would like a referral to the pulmonologist here, so it is closer to  home.    INTERVAL HISTORY:   Kathleen Bennett is a 62 y.o. female presenting to the clinic today for follow-up of Extensive stage small cell lung cancer. She was last seen by me on 04/18/23 in consultation.  Since her last visit, she underwent MRI of the brain on 04/20/23 that found: numerous bony metastasis throughout the calvarium, skull base, face/jaws, and visualized upper cervical spine; a right parietal bony lesion has a soft tissue component with both extracranial/scalp and intracranial extension of tumor; tumor abuts the right parietal lobe without brain edema; multiple additional areas of milder probable dural involvement along both cerebral convexities; extraosseous extension of tumor involving the jaws (bulky on the right) and likely the sphenoid bones; and no  intraparenchymal metastatic disease identified.  She presented to the ED on 04/21/23 for post-op pain and hematoma from port placement on 04/19/23.   Today, she states that she is doing well overall. Her appetite level is at ***%. Her energy level is at ***%.   PAST MEDICAL HISTORY:   Past Medical History: Past Medical History:  Diagnosis Date   Anxiety    COPD (chronic obstructive pulmonary disease) (HCC)    DDD (degenerative disc disease), lumbar    Depression    Diverticulitis    GERD (gastroesophageal reflux disease)    Heart murmur    High cholesterol    History of pneumonia as a child    Hypertension    Oxygen dependent    3L    PONV (postoperative nausea and vomiting)     Surgical History: Past Surgical History:  Procedure Laterality Date   ABDOMINAL HYSTERECTOMY     BIOPSY  10/12/2021   Procedure: BIOPSY;  Surgeon: Lanelle Bal, DO;  Location: [facility];  Service: Endoscopy;;   BRONCHIAL NEEDLE ASPIRATION BIOPSY  04/16/2023   Procedure: BRONCHIAL NEEDLE ASPIRATION BIOPSIES;  Surgeon: Josephine Igo, DO;  Location: [facility];  Service: Pulmonary;;   COLONOSCOPY WITH PROPOFOL N/A 10/12/2021   Procedure: COLONOSCOPY WITH PROPOFOL;  Surgeon: Lanelle Bal, DO;  Location: [facility];  Service: Endoscopy;  Laterality: N/A;  1:30 / ASA 3   IR IMAGING GUIDED PORT INSERTION  04/19/2023   VIDEO BRONCHOSCOPY WITH ENDOBRONCHIAL ULTRASOUND Bilateral 04/16/2023   Procedure: VIDEO BRONCHOSCOPY WITH ENDOBRONCHIAL ULTRASOUND;  Surgeon: Josephine Igo, DO;  Location: [facility];  Service: Pulmonary;  Laterality: Bilateral;    Social History: Social History   Socioeconomic History   Marital status: Married    Spouse name: Not on file   Number of children: Not on file   Years of education: Not on file   Highest education level: Not on file  Occupational History   Not on file  Tobacco Use   Smoking status: Former    Current packs/day: 1.50    Average  packs/day: 1.5 packs/day for 40.0 years (60.0 ttl pk-yrs)    Types: Cigarettes   Smokeless tobacco: Never  Vaping Use   Vaping status: Never Used  Substance and Sexual Activity   Alcohol use: Yes    Comment: beer daily x 6   Drug use: No   Sexual activity: Not on file  Other Topics Concern   Not on file  Social History Narrative   Not on file   Social Determinants of Health   Financial Resource Strain: Not on file  Food Insecurity: Food Insecurity Present (04/03/2023)   Hunger Vital Sign    Worried About Running Out of Food in the Last Year:  Often true    Ran Out of Food in the Last Year: Never true  Transportation Needs: No Transportation Needs (04/03/2023)   PRAPARE - Administrator, Civil Service (Medical): No    Lack of Transportation (Non-Medical): No  Physical Activity: Not on file  Stress: Not on file  Social Connections: Not on file  Intimate Partner Violence: Not At Risk (04/03/2023)   Humiliation, Afraid, Rape, and Kick questionnaire    Fear of Current or Ex-Partner: No    Emotionally Abused: No    Physically Abused: No    Sexually Abused: No    Family History: No family history on file.  Current Medications:  Current Outpatient Medications:    albuterol (PROVENTIL HFA;VENTOLIN HFA) 108 (90 BASE) MCG/ACT inhaler, Inhale 2 puffs into the lungs every 6 (six) hours as needed for shortness of breath or wheezing., Disp: , Rfl:    albuterol (PROVENTIL) (2.5 MG/3ML) 0.083% nebulizer solution, Take 2.5 mg by nebulization every 6 (six) hours as needed for wheezing or shortness of breath., Disp: , Rfl:    aspirin EC 81 MG tablet, Take 81 mg by mouth daily. Swallow whole., Disp: , Rfl:    Aspirin-Acetaminophen-Caffeine (GOODYS EXTRA STRENGTH) 500-325-65 MG PACK, Take 1 packet by mouth 2 (two) times daily as needed (pain.)., Disp: , Rfl:    baclofen (LIORESAL) 10 MG tablet, Take 10 mg by mouth every 8 (eight) hours as needed., Disp: , Rfl:    CARBOPLATIN IV,  Inject into the vein every 21 ( twenty-one) days., Disp: , Rfl:    Cholecalciferol (VITAMIN D-3) 125 MCG (5000 UT) TABS, Take 5,000 Units by mouth in the morning., Disp: , Rfl:    Durvalumab (IMFINZI IV), Inject into the vein every 21 ( twenty-one) days., Disp: , Rfl:    ETOPOSIDE IV, Inject into the vein every 21 ( twenty-one) days., Disp: , Rfl:    furosemide (LASIX) 40 MG tablet, Take 20 mg by mouth daily as needed for fluid or edema., Disp: , Rfl:    Lactulose 20 GM/30ML SOLN, Take 30 mLs (20 g total) by mouth daily., Disp: 450 mL, Rfl: 2   lidocaine (XYLOCAINE) 2 % solution, SMARTSIG:By Mouth, Disp: , Rfl:    lidocaine-prilocaine (EMLA) cream, Apply to affected area once, Disp: 30 g, Rfl: 3   lovastatin (MEVACOR) 20 MG tablet, Take 20 mg by mouth every morning., Disp: , Rfl:    megestrol (MEGACE) 400 MG/10ML suspension, Place 10 mLs (400 mg total) into feeding tube daily., Disp: 240 mL, Rfl: 3   Multiple Vitamin (MULTIVITAMIN WITH MINERALS) TABS tablet, Take 1 tablet by mouth daily., Disp: , Rfl:    omeprazole (PRILOSEC) 20 MG capsule, Take 20 mg by mouth daily before breakfast., Disp: , Rfl:    PARoxetine (PAXIL) 20 MG tablet, Take 20 mg by mouth in the morning., Disp: , Rfl:    potassium chloride (KLOR-CON) 10 MEQ tablet, Take 10 mEq by mouth every morning., Disp: , Rfl:    prochlorperazine (COMPAZINE) 10 MG tablet, Take 1 tablet (10 mg total) by mouth every 6 (six) hours as needed for nausea or vomiting., Disp: 60 tablet, Rfl: 3   simethicone (MYLICON) 125 MG chewable tablet, Chew 125 mg by mouth every 6 (six) hours as needed for flatulence., Disp: , Rfl:    SYMBICORT 80-4.5 MCG/ACT inhaler, Inhale 2 puffs into the lungs in the morning and at bedtime., Disp: , Rfl:    traMADol (ULTRAM) 50 MG tablet, Take 1 tablet (50  mg total) by mouth every 6 (six) hours as needed., Disp: 60 tablet, Rfl: 0   verapamil (CALAN) 120 MG tablet, Take 240 mg by mouth 2 (two) times daily., Disp: , Rfl:     vitamin B-12 (CYANOCOBALAMIN) 1000 MCG tablet, Take 1,000 mcg by mouth in the morning., Disp: , Rfl:    zolpidem (AMBIEN) 5 MG tablet, Take 5 mg by mouth at bedtime., Disp: , Rfl:    Allergies: Allergies  Allergen Reactions   Codeine Itching, Other (See Comments) and Rash    REACTION: Severe itching, feels like skin is crawling    REVIEW OF SYSTEMS:   Review of Systems  Constitutional:  Negative for chills, fatigue and fever.  HENT:   Negative for lump/mass, mouth sores, nosebleeds, sore throat and trouble swallowing.   Eyes:  Negative for eye problems.  Respiratory:  Negative for cough and shortness of breath.   Cardiovascular:  Negative for chest pain, leg swelling and palpitations.  Gastrointestinal:  Negative for abdominal pain, constipation, diarrhea, nausea and vomiting.  Genitourinary:  Negative for bladder incontinence, difficulty urinating, dysuria, frequency, hematuria and nocturia.   Musculoskeletal:  Negative for arthralgias, back pain, flank pain, myalgias and neck pain.  Skin:  Negative for itching and rash.  Neurological:  Negative for dizziness, headaches and numbness.  Hematological:  Does not bruise/bleed easily.  Psychiatric/Behavioral:  Negative for depression, sleep disturbance and suicidal ideas. The patient is not nervous/anxious.   All other systems reviewed and are negative.    VITALS:   There were no vitals taken for this visit.  Wt Readings from Last 3 Encounters:  04/23/23 105 lb 6.4 oz (47.8 kg)  04/21/23 104 lb 15 oz (47.6 kg)  04/19/23 105 lb (47.6 kg)    There is no height or weight on file to calculate BMI.  Performance status (ECOG): 2 - Symptomatic, <50% confined to bed  PHYSICAL EXAM:   Physical Exam Vitals and nursing note reviewed. Exam conducted with a chaperone present.  Constitutional:      Appearance: Normal appearance.  Cardiovascular:     Rate and Rhythm: Normal rate and regular rhythm.     Pulses: Normal pulses.     Heart  sounds: Normal heart sounds.  Pulmonary:     Effort: Pulmonary effort is normal.     Breath sounds: Normal breath sounds.  Abdominal:     Palpations: Abdomen is soft. There is no hepatomegaly, splenomegaly or mass.     Tenderness: There is no abdominal tenderness.  Musculoskeletal:     Right lower leg: No edema.     Left lower leg: No edema.  Lymphadenopathy:     Cervical: No cervical adenopathy.     Right cervical: No superficial, deep or posterior cervical adenopathy.    Left cervical: No superficial, deep or posterior cervical adenopathy.     Upper Body:     Right upper body: No supraclavicular or axillary adenopathy.     Left upper body: No supraclavicular or axillary adenopathy.  Neurological:     General: No focal deficit present.     Mental Status: She is alert and oriented to person, place, and time.  Psychiatric:        Mood and Affect: Mood normal.        Behavior: Behavior normal.     LABS:      Latest Ref Rng & Units 04/23/2023    8:50 AM 04/21/2023   11:28 AM 04/16/2023   10:36 AM  CBC  WBC 4.0 - 10.5 K/uL 11.8  11.0    Hemoglobin 12.0 - 15.0 g/dL 62.9  52.8  41.3   Hematocrit 36.0 - 46.0 % 40.5  40.8  42.0   Platelets 150 - 400 K/uL 190  163        Latest Ref Rng & Units 04/23/2023    8:50 AM 04/21/2023   11:28 AM 04/16/2023   10:36 AM  CMP  Glucose 70 - 99 mg/dL 95  79  91   BUN 8 - 23 mg/dL 12  15  22    Creatinine 0.44 - 1.00 mg/dL 2.44  0.10  2.72   Sodium 135 - 145 mmol/L 130  132  132   Potassium 3.5 - 5.1 mmol/L 3.3  3.4  5.4   Chloride 98 - 111 mmol/L 88  91  93   CO2 22 - 32 mmol/L 31  31    Calcium 8.9 - 10.3 mg/dL 53.6  64.4    Total Protein 6.5 - 8.1 g/dL 6.2     Total Bilirubin 0.3 - 1.2 mg/dL 1.1     Alkaline Phos 38 - 126 U/L 74     AST 15 - 41 U/L 28     ALT 0 - 44 U/L 21        No results found for: "CEA1", "CEA" / No results found for: "CEA1", "CEA" No results found for: "PSA1" No results found for: "IHK742" No results found for:  "CAN125"  No results found for: "TOTALPROTELP", "ALBUMINELP", "A1GS", "A2GS", "BETS", "BETA2SER", "GAMS", "MSPIKE", "SPEI" No results found for: "TIBC", "FERRITIN", "IRONPCTSAT" No results found for: "LDH"   STUDIES:   DG Chest Port 1 View  Result Date: 04/21/2023 CLINICAL DATA:  Shortness of breath. Status post porta catheter placement. There is now blood at the porta catheter insertion site. EXAM: PORTABLE CHEST 1 VIEW COMPARISON:  PET-CT 04/11/2023. FINDINGS: There is a right chest wall port a catheter. The tip is in the projection of the cavoatrial junction. No pneumothorax identified. Heart size and mediastinal contours are stable. No pleural fluid or interstitial edema. The previously noted pleural base nodule overlying the anterolateral right upper lobe is again seen and appears unchanged. The right upper lobe anteromedial perihilar lung mass is suboptimally visualized by portable technique. Chronic deformity of the left humeral head is again noted. IMPRESSION: 1. Right chest wall port a catheter tip is in the projection of the cavoatrial junction. No pneumothorax identified. 2. Stable appearance of right upper lobe pleural base nodule. 3. Right upper lobe anteromedial perihilar lung mass is suboptimally visualized by portable technique. Electronically Signed   By: Signa Kell M.D.   On: 04/21/2023 11:53   MR Brain W Wo Contrast  Result Date: 04/20/2023 CLINICAL DATA:  Small cell lung cancer (SCLC), staging new blurry vision EXAM: MRI HEAD WITHOUT AND WITH CONTRAST TECHNIQUE: Multiplanar, multiecho pulse sequences of the brain and surrounding structures were obtained without and with intravenous contrast. CONTRAST:  4mL GADAVIST GADOBUTROL 1 MMOL/ML IV SOLN COMPARISON:  None Available. FINDINGS: Brain: Intracranial and extracranial extension of a calvarial metastases is detailed below. No intraparenchymal metastasis identified. Patchy T2/FLAIR hyperintensities in the white matter are  nonspecific but compatible with chronic microvascular ischemic change. No evidence of acute infarct, acute hemorrhage, midline shift or hydrocephalus. Cerebral atrophy. Vascular: Major arterial flow voids are maintained at the skull base. Skull and upper cervical spine: Many areas of abnormal marrow signal and enhancement associated with the calvarium, skull base, imaged jaws, and  visualized cervical spine, compatible with osseous metastasis. This includes a right parietal calvarial metastasis that has a soft tissue extracranial and intracranial component. Intracranial enhancement of abuts the adjacent right parietal lobe. Milder dural thickening enhancement along bilateral frontal convexities, left temporal, and left parietal convexity. Sinuses/Orbits: Clear sinuses. No obvious acute orbital abnormality. Other: No sizable mastoid effusions. IMPRESSION: 1. Numerous bony metastasis throughout the calvarium, skull base, face/jaws, and visualized upper cervical spine. 2. A right parietal bony lesion has a soft tissue component with both extracranial/scalp and intracranial extension of tumor. Tumor abuts the right parietal lobe without brain edema. Multiple additional areas of milder probable dural involvement along both cerebral convexities. 3. Extraosseous extension of tumor involving the jaws (bulky on the right) and likely the sphenoid bones. An MRI of the face and neck with contrast could further characterize if clinically warranted. 4. No intraparenchymal metastatic disease identified. Electronically Signed   By: Feliberto Harts M.D.   On: 04/20/2023 19:41   IR IMAGING GUIDED PORT INSERTION  Result Date: 04/19/2023 INDICATION: 62 year old female with history of lung cancer requiring central venous access for chemotherapy administration. EXAM: IMPLANTED PORT A CATH PLACEMENT WITH ULTRASOUND AND FLUOROSCOPIC GUIDANCE COMPARISON:  None Available. MEDICATIONS: None. ANESTHESIA/SEDATION: Moderate (conscious)  sedation was employed during this procedure. A total of Versed 3 mg and Fentanyl 100 mcg was administered intravenously. Moderate Sedation Time: 15 minutes. The patient's level of consciousness and vital signs were monitored continuously by radiology nursing throughout the procedure under my direct supervision. CONTRAST:  None FLUOROSCOPY TIME:  One mGy COMPLICATIONS: None immediate. PROCEDURE: The procedure, risks, benefits, and alternatives were explained to the patient. Questions regarding the procedure were encouraged and answered. The patient understands and consents to the procedure. The right neck and chest were prepped with chlorhexidine in a sterile fashion, and a sterile drape was applied covering the operative field. Maximum barrier sterile technique with sterile gowns and gloves were used for the procedure. A timeout was performed prior to the initiation of the procedure. Ultrasound was used to examine the jugular vein which was compressible and free of internal echoes. A skin marker was used to demarcate the planned venotomy and port pocket incision sites. Local anesthesia was provided to these sites and the subcutaneous tunnel track with 1% lidocaine with 1:100,000 epinephrine. A small incision was created at the jugular access site and blunt dissection was performed of the subcutaneous tissues. Under ultrasound guidance, the jugular vein was accessed with a 21 ga micropuncture needle and an 0.018" wire was inserted to the superior vena cava. Real-time ultrasound guidance was utilized for vascular access including the acquisition of a permanent ultrasound image documenting patency of the accessed vessel. A 5 Fr micopuncture set was then used, through which a 0.035" Rosen wire was passed under fluoroscopic guidance into the inferior vena cava. An 8 Fr dilator was then placed over the wire. A subcutaneous port pocket was then created along the upper chest wall utilizing a combination of sharp and blunt  dissection. The pocket was irrigated with sterile saline, packed with gauze, and observed for hemorrhage. A single lumen "ISP" sized power injectable port was chosen for placement. The 8 Fr catheter was tunneled from the port pocket site to the venotomy incision. The port was placed in the pocket. The external catheter was trimmed to appropriate length. The dilator was exchanged for an 8 Fr peel-away sheath under fluoroscopic guidance. The catheter was then placed through the sheath and the sheath was removed. Final catheter  positioning was confirmed and documented with a fluoroscopic spot radiograph. The port was accessed with a Huber needle, aspirated, and flushed with heparinized saline. The deep dermal layer of the port pocket incision was closed with interrupted 3-0 Vicryl suture. Dermabond was then placed over the port pocket and neck incisions. The patient tolerated the procedure well without immediate post procedural complication. FINDINGS: After catheter placement, the tip lies within the superior cavoatrial junction. The catheter aspirates and flushes normally and is ready for immediate use. IMPRESSION: Successful placement of a power injectable Port-A-Cath via the right internal jugular vein. The catheter is ready for immediate use. Marliss Coots, MD [facility] Radiology Electronically Signed   By: Marliss Coots M.D.   On: 04/19/2023 11:40   NM PET Image Initial (PI) Skull Base To Thigh (F-18 FDG)  Result Date: 04/15/2023 CLINICAL DATA:  Initial treatment strategy for right upper lobe lung mass. EXAM: NUCLEAR MEDICINE PET SKULL BASE TO THIGH TECHNIQUE: 6.2 mCi F-18 FDG was injected intravenously. Full-ring PET imaging was performed from the skull base to thigh after the radiotracer. CT data was obtained and used for attenuation correction and anatomic localization. Fasting blood glucose: 143 mg/dl COMPARISON:  16/05/9603 chest CT angiogram FINDINGS:  Mediastinal blood pool activity: SUV max 2.1 Liver activity: SUV max NA NECK: No hypermetabolic lymph nodes in the neck. Incidental CT findings: None. CHEST: Hypermetabolic irregular solid right upper lobe 6.0 x 3.2 cm lung mass with max SUV 10.1 (series 203/image 16) with numerous surrounding satellite pulmonary nodules up to 0.8 cm in the medial right upper lobe (series 203/image 14), not appreciably changed from 04/02/2023 chest CT angiogram study. Hypermetabolic solid peripheral right upper lobe subpleural 1.7 cm pulmonary nodule with max SUV 4.5 (series 203/image 22), stable from recent CTA. No hypermetabolic left lung findings. Enlarged hypermetabolic 2.6 cm short axis diameter right hilar lymph node with max SUV 9.0. No enlarged or hypermetabolic left hilar nodes. Low right paratracheal small 0.7 cm node demonstrates low level hypermetabolism with max SUV 2.3 (series 203/image 21). No additional enlarged or hypermetabolic mediastinal nodes. No enlarged or hypermetabolic axillary nodes. Incidental CT findings: Moderate centrilobular emphysema. Coronary atherosclerosis. Atherosclerotic nonaneurysmal thoracic aorta. Similar small anterior pericardial effusion. ABDOMEN/PELVIS: Tiny focus of hypermetabolism in far inferior right liver with max SUV 3.3, without discrete correlate on the noncontrast CT images. No additional foci of liver hypermetabolism. No abnormal hypermetabolic activity within the pancreas, adrenal glands, or spleen. No hypermetabolic lymph nodes in the abdomen or pelvis. Incidental CT findings: Atherosclerotic nonaneurysmal abdominal aorta. Mild sigmoid diverticulosis. Apparent hysterectomy. SKELETON: Numerous hypermetabolic osseous lesions scattered throughout the bilateral pelvic girdle, spine, ribs and right mandible with a subtle moth-eaten appearance on the corresponding CT images. Representative lesions as follows: -right mandible neck lesion with max SUV 6.4 (series 202/image 16)  -anterolateral right fourth rib lesion with max SUV 4.6 (series 202/image 51) -L4 vertebral lesion with max SUV 7.6 -medial left iliac bone lesion with max SUV 10.0 -left ischium lesion with max SUV 5.2 -left symphysis pubis lesion with max SUV 6.3 Incidental CT findings: None. IMPRESSION: 1. Hypermetabolic irregular solid right upper lobe 6.0 cm lung mass with numerous surrounding satellite pulmonary nodules, compatible with primary bronchogenic malignancy. 2. Hypermetabolic solid peripheral right upper lobe subpleural 1.7 cm pulmonary nodule, compatible with intralobar pulmonary metastasis. 3. Hypermetabolic metastatic right hilar adenopathy. Low level hypermetabolism within a small 0.7 cm low right paratracheal lymph node, suspicious for ipsilateral mediastinal nodal metastasis. 4. Widespread  hypermetabolic osseous metastases scattered throughout the axial skeleton as detailed. 5. Tiny focus of hypermetabolism in the far inferior right liver without discrete correlate on the noncontrast CT images, indeterminate for early liver metastasis versus artifact. MRI abdomen without and with IV contrast may be obtained for further characterization if clinically warranted. 6. Chronic findings include: Similar small anterior pericardial effusion. Coronary atherosclerosis. Mild sigmoid diverticulosis. Aortic Atherosclerosis (ICD10-I70.0) and Emphysema (ICD10-J43.9). Electronically Signed   By: Delbert Phenix M.D.   On: 04/15/2023 16:46   CT Angio Chest PE W/Cm &/Or Wo Cm  Result Date: 04/02/2023 CLINICAL DATA:  Pulmonary embolism suspected, high probability. Productive cough for 1 month, shortness of breath. History of COPD. EXAM: CT ANGIOGRAPHY CHEST WITH CONTRAST TECHNIQUE: Multidetector CT imaging of the chest was performed using the standard protocol during bolus administration of intravenous contrast. Multiplanar CT image reconstructions and MIPs were obtained to evaluate the vascular anatomy. RADIATION DOSE  REDUCTION: This exam was performed according to the departmental dose-optimization program which includes automated exposure control, adjustment of the mA and/or kV according to patient size and/or use of iterative reconstruction technique. CONTRAST:  75mL OMNIPAQUE IOHEXOL 350 MG/ML SOLN COMPARISON:  None Available. FINDINGS: Cardiovascular: Heart is normal in size and there is a trace pericardial effusion. Multi-vessel coronary artery calcifications are noted. There is atherosclerotic calcification of the aorta without evidence of aneurysm. The pulmonary trunk is normal in caliber. No evidence of pulmonary embolism. Mediastinum/Nodes: No mediastinal or axillary lymphadenopathy. Enlarged lymph nodes are present the right hilum measuring up to 1.9 cm. Coarse calcifications are noted in the left lobe of the thyroid gland. The trachea and esophagus are within normal limits. Lungs/Pleura: Paraseptal and centrilobular emphysematous changes are present in the lungs. There is a lobular mass in the right upper lobe measuring 3.8 x 1.7 cm, axial image 42. Surrounding pulmonary nodules are noted in the right upper lobe measuring up to 8 mm. There is pleural thickening in the right upper lobe measuring 1.8 cm. There is narrowing of the right middle lobe bronchus with associated atelectasis and consolidation. Small 2-3 mm nodules are noted in the lungs bilaterally. No effusion or pneumothorax. Upper Abdomen: A subcentimeter hypodensity is noted in the left lobe of the liver which is too small to further characterize. No acute abnormality. Musculoskeletal: Degenerative changes are present in the thoracic spine. There are questionable erosions of the T5 rib on the right in the region of focal pleural thickening. Compression deformities are noted in the superior endplates at T10, Z61, and L1. Review of the MIP images confirms the above findings. IMPRESSION: 1. No evidence of pulmonary embolism. 2. Lobulated mass with surrounding  satellite nodules in the right upper lobe measuring 3.9 x 6.7 cm. Consider one of the following in 3 months for both low-risk and high-risk individuals: (a) repeat chest CT, (b) follow-up PET-CT, or (c) tissue sampling. This recommendation follows the consensus statement: Guidelines for Management of Incidental Pulmonary Nodules Detected on CT Images: From the Fleischner Society 2017; Radiology 2017; 284:228-243. 3. Focal pleural thickening in the right upper lobe with questionable erosions of the adjacent T5 rib on the right, possible metastatic disease. 4. Scattered 2-3 mm nodules bilaterally. Attention on follow-up is recommended. 5. Atelectasis or infiltrate in the right middle lobe. 6. Emphysema. 7. Aortic atherosclerosis and coronary artery calcifications. Electronically Signed   By: Thornell Sartorius M.D.   On: 04/02/2023 20:33   DG Chest 2 View  Result Date: 04/02/2023 CLINICAL DATA:  Shortness of breath.  Swelling to the right face. EXAM: CHEST - 2 VIEW COMPARISON:  01/23/2021 FINDINGS: Heart size and pulmonary vascularity are normal. Lungs are clear. No pleural effusion or pneumothorax. Degenerative changes in the spine. Severe degenerative change in the left shoulder. Calcification of the aorta. IMPRESSION: No active cardiopulmonary disease. Electronically Signed   By: Burman Nieves M.D.   On: 04/02/2023 16:30

## 2023-05-02 ENCOUNTER — Other Ambulatory Visit: Payer: Self-pay

## 2023-05-02 ENCOUNTER — Encounter (HOSPITAL_COMMUNITY): Payer: Self-pay

## 2023-05-02 ENCOUNTER — Observation Stay (HOSPITAL_COMMUNITY)
Admission: EM | Admit: 2023-05-02 | Discharge: 2023-05-02 | Disposition: A | Discharge status: Home Health | Payer: Medicaid Other | Attending: Internal Medicine | Admitting: Internal Medicine

## 2023-05-02 ENCOUNTER — Emergency Department (HOSPITAL_COMMUNITY): Payer: Medicaid Other

## 2023-05-02 ENCOUNTER — Inpatient Hospital Stay: Payer: Medicaid Other

## 2023-05-02 ENCOUNTER — Inpatient Hospital Stay: Payer: Medicaid Other | Admitting: Hematology

## 2023-05-02 DIAGNOSIS — Z87891 Personal history of nicotine dependence: Secondary | ICD-10-CM | POA: Diagnosis not present

## 2023-05-02 DIAGNOSIS — C3431 Malignant neoplasm of lower lobe, right bronchus or lung: Secondary | ICD-10-CM | POA: Insufficient documentation

## 2023-05-02 DIAGNOSIS — G893 Neoplasm related pain (acute) (chronic): Secondary | ICD-10-CM | POA: Diagnosis not present

## 2023-05-02 DIAGNOSIS — C3490 Malignant neoplasm of unspecified part of unspecified bronchus or lung: Secondary | ICD-10-CM | POA: Diagnosis not present

## 2023-05-02 DIAGNOSIS — E871 Hypo-osmolality and hyponatremia: Secondary | ICD-10-CM | POA: Diagnosis not present

## 2023-05-02 DIAGNOSIS — E785 Hyperlipidemia, unspecified: Secondary | ICD-10-CM | POA: Diagnosis present

## 2023-05-02 DIAGNOSIS — E782 Mixed hyperlipidemia: Secondary | ICD-10-CM

## 2023-05-02 DIAGNOSIS — I10 Essential (primary) hypertension: Secondary | ICD-10-CM

## 2023-05-02 DIAGNOSIS — E860 Dehydration: Secondary | ICD-10-CM

## 2023-05-02 DIAGNOSIS — D696 Thrombocytopenia, unspecified: Secondary | ICD-10-CM | POA: Diagnosis not present

## 2023-05-02 DIAGNOSIS — Z7982 Long term (current) use of aspirin: Secondary | ICD-10-CM | POA: Diagnosis not present

## 2023-05-02 DIAGNOSIS — E876 Hypokalemia: Principal | ICD-10-CM

## 2023-05-02 DIAGNOSIS — J9611 Chronic respiratory failure with hypoxia: Secondary | ICD-10-CM

## 2023-05-02 DIAGNOSIS — C7951 Secondary malignant neoplasm of bone: Secondary | ICD-10-CM | POA: Diagnosis not present

## 2023-05-02 DIAGNOSIS — K219 Gastro-esophageal reflux disease without esophagitis: Secondary | ICD-10-CM | POA: Diagnosis not present

## 2023-05-02 DIAGNOSIS — Z515 Encounter for palliative care: Secondary | ICD-10-CM | POA: Diagnosis not present

## 2023-05-02 DIAGNOSIS — Z1152 Encounter for screening for COVID-19: Secondary | ICD-10-CM | POA: Insufficient documentation

## 2023-05-02 DIAGNOSIS — Z79899 Other long term (current) drug therapy: Secondary | ICD-10-CM | POA: Insufficient documentation

## 2023-05-02 DIAGNOSIS — J441 Chronic obstructive pulmonary disease with (acute) exacerbation: Secondary | ICD-10-CM

## 2023-05-02 DIAGNOSIS — Z7189 Other specified counseling: Secondary | ICD-10-CM

## 2023-05-02 DIAGNOSIS — R0602 Shortness of breath: Secondary | ICD-10-CM | POA: Diagnosis present

## 2023-05-02 DIAGNOSIS — C3411 Malignant neoplasm of upper lobe, right bronchus or lung: Secondary | ICD-10-CM

## 2023-05-02 HISTORY — DX: Malignant (primary) neoplasm, unspecified: C80.1

## 2023-05-02 LAB — CBC WITH DIFFERENTIAL/PLATELET
Abs Immature Granulocytes: 0 10*3/uL (ref 0.00–0.07)
Basophils Absolute: 0 10*3/uL (ref 0.0–0.1)
Basophils Relative: 0 %
Eosinophils Absolute: 0 10*3/uL (ref 0.0–0.5)
Eosinophils Relative: 0 %
HCT: 36 % (ref 36.0–46.0)
Hemoglobin: 12.3 g/dL (ref 12.0–15.0)
Lymphocytes Relative: 76 %
Lymphs Abs: 0.2 10*3/uL — ABNORMAL LOW (ref 0.7–4.0)
MCH: 32.5 pg (ref 26.0–34.0)
MCHC: 34.2 g/dL (ref 30.0–36.0)
MCV: 95 fL (ref 80.0–100.0)
Monocytes Absolute: 0 10*3/uL — ABNORMAL LOW (ref 0.1–1.0)
Monocytes Relative: 20 %
Neutro Abs: 0 10*3/uL — CL (ref 1.7–7.7)
Neutrophils Relative %: 4 %
Platelets: 22 10*3/uL — CL (ref 150–400)
RBC: 3.79 MIL/uL — ABNORMAL LOW (ref 3.87–5.11)
RDW: 15 % (ref 11.5–15.5)
WBC: 0.2 10*3/uL — CL (ref 4.0–10.5)
nRBC: 0 % (ref 0.0–0.2)

## 2023-05-02 LAB — CBG MONITORING, ED: Glucose-Capillary: 83 mg/dL (ref 70–99)

## 2023-05-02 LAB — TYPE AND SCREEN
ABO/RH(D): O POS
Antibody Screen: NEGATIVE

## 2023-05-02 LAB — LIPASE, BLOOD: Lipase: 21 U/L (ref 11–51)

## 2023-05-02 LAB — LACTIC ACID, PLASMA
Lactic Acid, Venous: 1.1 mmol/L (ref 0.5–1.9)
Lactic Acid, Venous: 0.9 mmol/L (ref 0.5–1.9)

## 2023-05-02 LAB — COMPREHENSIVE METABOLIC PANEL
ALT: 20 U/L (ref 0–44)
AST: 14 U/L — ABNORMAL LOW (ref 15–41)
Albumin: 2.7 g/dL — ABNORMAL LOW (ref 3.5–5.0)
Alkaline Phosphatase: 86 U/L (ref 38–126)
Anion gap: 15 (ref 5–15)
BUN: 12 mg/dL (ref 8–23)
CO2: 27 mmol/L (ref 22–32)
Calcium: 7.7 mg/dL — ABNORMAL LOW (ref 8.9–10.3)
Chloride: 88 mmol/L — ABNORMAL LOW (ref 98–111)
Creatinine, Ser: 0.68 mg/dL (ref 0.44–1.00)
GFR, Estimated: >60 mL/min (ref >60)
Glucose, Bld: 94 mg/dL (ref 70–99)
Potassium: 2.1 mmol/L — CL (ref 3.5–5.1)
Sodium: 130 mmol/L — ABNORMAL LOW (ref 135–145)
Total Bilirubin: 1.4 mg/dL — ABNORMAL HIGH (ref 0.3–1.2)
Total Protein: 6.2 g/dL — ABNORMAL LOW (ref 6.5–8.1)

## 2023-05-02 LAB — MAGNESIUM: Magnesium: 1.2 mg/dL — ABNORMAL LOW (ref 1.7–2.4)

## 2023-05-02 LAB — SARS CORONAVIRUS 2 BY RT PCR: SARS Coronavirus 2 by RT PCR: NEGATIVE

## 2023-05-02 MED ORDER — LORAZEPAM 1 MG PO TABS
1.0000 mg | ORAL_TABLET | Freq: Three times a day (TID) | ORAL | Status: DC | PRN
  Administered 2023-05-02: 1 mg via ORAL
  Filled 2023-05-02: qty 1

## 2023-05-02 MED ORDER — POTASSIUM CHLORIDE 2 MEQ/ML IV SOLN: INTRAVENOUS | Status: DC

## 2023-05-02 MED ORDER — HYDROMORPHONE HCL 1 MG/ML IJ SOLN
1.0000 mg | INTRAMUSCULAR | Status: DC | PRN
  Administered 2023-05-02: 1 mg via INTRAVENOUS
  Filled 2023-05-02: qty 1

## 2023-05-02 MED ORDER — METHYLPREDNISOLONE SODIUM SUCC 40 MG IJ SOLR
40.0000 mg | Freq: Every day | INTRAMUSCULAR | Status: DC
  Administered 2023-05-02: 40 mg via INTRAVENOUS
  Filled 2023-05-02: qty 1

## 2023-05-02 MED ORDER — FENTANYL 12 MCG/HR TD PT72
1.0000 | MEDICATED_PATCH | TRANSDERMAL | Status: DC
  Administered 2023-05-02: 1 via TRANSDERMAL
  Filled 2023-05-02: qty 1

## 2023-05-02 MED ORDER — MAGNESIUM SULFATE 2 GM/50ML IV SOLN
2.0000 g | Freq: Once | INTRAVENOUS | Status: AC
  Administered 2023-05-02: 2 g via INTRAVENOUS
  Filled 2023-05-02: qty 50

## 2023-05-02 MED ORDER — HEPARIN SOD (PORK) LOCK FLUSH 100 UNIT/ML IV SOLN
500.0000 [IU] | Freq: Once | INTRAVENOUS | Status: AC
  Administered 2023-05-02: 500 [IU] via INTRAVENOUS
  Filled 2023-05-02: qty 5

## 2023-05-02 MED ORDER — KCL IN DEXTROSE-NACL 40-5-0.9 MEQ/L-%-% IV SOLN
INTRAVENOUS | Status: DC
  Filled 2023-05-02 (×3): qty 1000

## 2023-05-02 MED ORDER — ACETAMINOPHEN 325 MG PO TABS: 650.0000 mg | ORAL_TABLET | Freq: Four times a day (QID) | ORAL | Status: DC | PRN

## 2023-05-02 MED ORDER — DIPHENOXYLATE-ATROPINE 2.5-0.025 MG PO TABS: 1.0000 | ORAL_TABLET | Freq: Four times a day (QID) | ORAL | Status: DC | PRN

## 2023-05-02 MED ORDER — POTASSIUM CHLORIDE CRYS ER 20 MEQ PO TBCR
40.0000 meq | EXTENDED_RELEASE_TABLET | ORAL | Status: AC
  Administered 2023-05-02 (×2): 40 meq via ORAL
  Filled 2023-05-02 (×2): qty 2

## 2023-05-02 MED ORDER — OXYCODONE HCL 5 MG/5ML PO SOLN
10.0000 mg | ORAL | Status: DC | PRN
  Administered 2023-05-02: 10 mg via ORAL
  Filled 2023-05-02: qty 10

## 2023-05-02 MED ORDER — IPRATROPIUM-ALBUTEROL 0.5-2.5 (3) MG/3ML IN SOLN
3.0000 mL | Freq: Four times a day (QID) | RESPIRATORY_TRACT | Status: DC
  Administered 2023-05-02 (×2): 3 mL via RESPIRATORY_TRACT
  Filled 2023-05-02 (×2): qty 3

## 2023-05-02 MED ORDER — ACETAMINOPHEN 650 MG RE SUPP: 650.0000 mg | Freq: Four times a day (QID) | RECTAL | Status: DC | PRN

## 2023-05-02 MED ORDER — PROCHLORPERAZINE EDISYLATE 10 MG/2ML IJ SOLN: 10.0000 mg | Freq: Four times a day (QID) | INTRAMUSCULAR | Status: DC | PRN

## 2023-05-02 MED ORDER — BUDESONIDE 0.5 MG/2ML IN SUSP
0.5000 mg | Freq: Two times a day (BID) | RESPIRATORY_TRACT | Status: DC
  Administered 2023-05-02 (×2): 0.5 mg via RESPIRATORY_TRACT
  Filled 2023-05-02 (×2): qty 2

## 2023-05-02 MED ORDER — ENSURE ENLIVE PO LIQD: 237.0000 mL | Freq: Two times a day (BID) | ORAL | Status: DC

## 2023-05-02 MED ORDER — PANTOPRAZOLE SODIUM 40 MG PO TBEC
40.0000 mg | DELAYED_RELEASE_TABLET | Freq: Every day | ORAL | Status: DC
  Administered 2023-05-02: 40 mg via ORAL
  Filled 2023-05-02: qty 1

## 2023-05-02 MED ORDER — ACETAMINOPHEN 325 MG PO TABS
650.0000 mg | ORAL_TABLET | Freq: Three times a day (TID) | ORAL | Status: DC
  Administered 2023-05-02: 650 mg via ORAL
  Filled 2023-05-02: qty 2

## 2023-05-02 MED ORDER — SODIUM CHLORIDE 0.9 % IV BOLUS
500.0000 mL | Freq: Once | INTRAVENOUS | Status: AC
  Administered 2023-05-02: 500 mL via INTRAVENOUS

## 2023-05-02 NOTE — ED Notes (Signed)
Ice water given

## 2023-05-02 NOTE — Assessment & Plan Note (Signed)
-  In the setting of GI losses (from diarrhea) and decreased oral intake. -Provide electrolyte repletion and follow clinical response.

## 2023-05-02 NOTE — TOC Initial Note (Signed)
Transition of Care [facility]) - Initial/Assessment Note    Patient Details  Name: Kathleen Bennett MRN: [id number] Date of Birth: [birth year]  [facility] CM/SW Contact:    Leitha Bleak, RN Phone Number: 05/02/2023, 2:45 PM  Clinical Narrative:        [facility] consulted for hospice referral. Palliative and MD has talked to patient in ED. She will be admitted due to coming in for comfort measures which has not been reached. Referral sent and text [employer] to get them to reach out to daughter to get DME needs.            Expected Discharge Plan: Home w Hospice Care Barriers to Discharge: Continued Medical Work up   Patient Goals and CMS Choice Patient states their goals for this hospitalization and ongoing recovery are:: to go home with Hospice CMS Medicare.gov Compare Post Acute Care list provided to:: Patient Represenative (must comment) Choice offered to / list presented to : Adult Children     Expected Discharge Plan and Services      Living arrangements for the past 2 months: Single Family Home                  HH Agency: [facility] Date [facility] Agency Contacted: 05/02/23 Time HH Agency Contacted: 1445 Representative spoke with at [facility] Agency: Faxed - texted Lelon Mast  Prior Living Arrangements/Services Living arrangements for the past 2 months: Single Family Home Lives with:: Spouse, Adult Children Patient language and need for interpreter reviewed:: Yes Do you feel safe going back to the place where you live?: Yes      Need for Family Participation in Patient Care: Yes (Comment) Care giver support system in place?: Yes (comment)   Criminal Activity/Legal Involvement Pertinent to Current Situation/Hospitalization: No - Comment as needed  Activities of Daily Living   Permission Sought/Granted         Permission granted to share info w Contact Information: Daughter  Emotional Assessment       Admission diagnosis:  Hypokalemia [E87.6] Patient Active  Problem List   Diagnosis Date Noted   Hypokalemia 05/02/2023   Small cell lung cancer, right upper lobe (HCC) 04/18/2023   Lung mass 04/03/2023   Acute respiratory failure with hypoxemia (HCC) 04/02/2023   COPD with acute exacerbation (HCC) 11/01/2020   Acute respiratory failure with hypoxia (HCC) 11/01/2020   Dehydration 11/01/2020   Leukocytosis 11/01/2020   Hyperglycemia 11/01/2020   Elevated MCV 11/01/2020   Hypercalcemia 11/01/2020   Hyponatremia 11/01/2020   Pleuritic chest pain 11/01/2020   Essential hypertension 11/01/2020   Hyperlipidemia 11/01/2020   GERD (gastroesophageal reflux disease) 11/01/2020   Tobacco abuse 11/01/2020   AKI (acute kidney injury) (HCC) 11/01/2020   Acute diverticulitis 11/21/2013   Diverticulitis 11/21/2013   PCP:  Juel Burrow, NP Pharmacy:   [facility] - [place], [place] - [address] [place] [address] Phone: [phone] Fax: [phone]   Social Determinants of Health (SDOH) Social History: SDOH Screenings   Food Insecurity: Food Insecurity Present (04/03/2023)  Housing: Low Risk  (04/03/2023)  Transportation Needs: No Transportation Needs (04/03/2023)  Utilities: Not At Risk (04/03/2023)  Depression (PHQ2-9): Low Risk  (04/18/2023)  Tobacco Use: Medium Risk (05/02/2023)   SDOH Interventions:

## 2023-05-02 NOTE — ED Triage Notes (Signed)
Pt brought in by [facility] for increased SOB. Pt is on 2-4 L at home but SOB is getting worse. Pt has stage 4 lung cancer.

## 2023-05-02 NOTE — Assessment & Plan Note (Signed)
-  Gentle fluid resuscitation and electrolyte repletion will be provided -Patient will receive comfort feeding.

## 2023-05-02 NOTE — Assessment & Plan Note (Signed)
-  Chronically on 3-4 L at home -Requiring up to 5 L to stabilize her air hunger; adequate saturation appreciated. -Most likely in the setting of COPD exacerbation and underlying lung cancer.

## 2023-05-02 NOTE — Consult Note (Signed)
Consultation Note Date: 05/02/2023   Patient Name: Kathleen Bennett  DOB: [birth year]  MRN: [id number]  Age / Sex: 63 y.o., female  PCP: Hyler, Janine Limbo, NP Referring Physician: Vassie Loll, MD  Reason for Consultation:  GOC  HPI/Patient Profile: 62 y.o. 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 Palliative consulted for GOC.   Primary Decision Maker PATIENT  Discussion: Chart reviewed including labs, progress notes, imaging from this and previous encounters.  Met with patient and her daughter at bedside.  Patient awake, alert, in no apparent distress.  She recently had her first cycle of chemotherapy and did not tolerate it. There are no plans for further treatment.  Her primary GOC is pain control.  We discussed hospice philosophy and services and she is agreeable.  She is very adamant that she wants to go home and does not want to be admitted. She notes her daughter can assist in her care. Her daughter is at bedside and agrees.     SUMMARY OF RECOMMENDATIONS -Start fentanyl patch 12.20mcg - apply to skin every 72 hours -Liquid oxycodone 10mg  q1hr prn for SOB or pain -She is being started on solu-medrol IV for COPD exacerbation- recommend continuing PO steroid on discharge for bone pain -Acetaminophen 650 mg po TID -No further lab draws - Appreciate TOC assistance with hospice referral    Code Status/Advance Care Planning: DNR   Prognosis:   < 6 months  Discharge Planning: Home with Hospice  Primary Diagnoses: Present on Admission:  Hypokalemia   Review of Systems  Constitutional:  Positive for activity change and fatigue.    Physical Exam Vitals and nursing note reviewed.  Constitutional:      Appearance: She is ill-appearing.     Comments: frail   Musculoskeletal:     Comments: Diffuse muscle wasting  Neurological:     Mental Status: She is alert and oriented to person, place, and time.     Vital Signs: BP 105/75   Pulse (!) 114   Temp 98.9 F (37.2 C) (Oral)   Resp (!) 28   Ht 5\' 4"  (1.626 m)   Wt 46.9 kg   SpO2 98%   BMI 17.75 kg/m  Pain Scale: 0-10   Pain Score: 2    SpO2: SpO2: 98 % O2 Device:SpO2: 98 % O2 Flow Rate: .O2 Flow Rate (L/min): 4 L/min  IO: Intake/output summary:  Intake/Output Summary (Last 24 hours) at 05/02/2023 1448 Last data filed at 05/02/2023 1156 Gross per 24 hour  Intake 546.74 ml  Output --  Net 546.74 ml    LBM:   Baseline Weight: Weight: 46.9 kg Most recent weight: Weight: 46.9 kg       Thank you for this consult. Palliative medicine will continue to follow and assist as needed.   Greater than 50%  of this time was spent counseling and coordinating care related to the above  assessment and plan.  Signed by: Ocie Bob, AGNP-C Palliative Medicine    Please contact Palliative Medicine Team phone at [phone] for questions and concerns.  For individual provider: See Loretha Stapler

## 2023-05-02 NOTE — Assessment & Plan Note (Signed)
-  Patient with thrombocytopenia and neutropenia most likely chemotherapy related -No overt bleeding -Focusing on symptomatic management and comfort care -No further chemo will be pursued.

## 2023-05-02 NOTE — Assessment & Plan Note (Signed)
-  Stopping statin -After following discussion and plan of care with patient and daughter, will pursue symptomatic management only.

## 2023-05-02 NOTE — H&P (Signed)
History and Physical    Patient: Kathleen Bennett ZOX:[id number] DOB: 1961/07/27 DOA: 05/02/2023 DOS: the patient was seen and examined on 05/02/2023 PCP: Juel Burrow, NP  Patient coming from: Home  Chief Complaint:  Chief Complaint  Patient presents with   Shortness of Breath   HPI: Kathleen Bennett is a 62 y.o. 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  Patient expressed symptoms has been present for the last 3 days and worsening.  She has recently started chemotherapy and expressed feeling terrible since then.  No nausea, no vomiting, no dysuria, no hematuria, no focal weaknesses, patient also reported no melena or hematochezia.  Daughter at bedside expressed that patient has not been eating much, she has not been as ambulatory as she was before and is essentially expressing the need of adjusted medication for her pain, improvement in her breathing and more than anything trying to be comfortable.  After discussion in the ED patient will like to stop chemotherapy, pursued comfort care and hospice at home.   Chest x-ray demonstrating no acute cardiopulmonary process.  COVID PCR negative; patient CBC demonstrating neutropenia and thrombocytopenia most likely associated with chemotherapy.  No Active bleeding.  Comprehensive metabolic panel: Sodium 130, potassium 2.1, chloride 88, bicarb 27, BUN 12, creatinine 0.68, albumin 2.7, normal LFTs.  Review of Systems: As mentioned in the history of present illness. All other systems reviewed and are negative. Past Medical History:  Diagnosis Date   Anxiety    Cancer (HCC)     COPD (chronic obstructive pulmonary disease) (HCC)    DDD (degenerative disc disease), lumbar    Depression    Diverticulitis    GERD (gastroesophageal reflux disease)    Heart murmur    High cholesterol    History of pneumonia as a child    Hypertension    Oxygen dependent    3L [place]   PONV (postoperative nausea and vomiting)    Past Surgical History:  Procedure Laterality Date   ABDOMINAL HYSTERECTOMY     BIOPSY  10/12/2021   Procedure: BIOPSY;  Surgeon: Lanelle Bal, DO;  Location: [facility];  Service: Endoscopy;;   BRONCHIAL NEEDLE ASPIRATION BIOPSY  04/16/2023   Procedure: BRONCHIAL NEEDLE ASPIRATION BIOPSIES;  Surgeon: Josephine Igo, DO;  Location: [facility];  Service: Pulmonary;;   COLONOSCOPY WITH PROPOFOL N/A 10/12/2021   Procedure: COLONOSCOPY WITH PROPOFOL;  Surgeon: Lanelle Bal, DO;  Location: [facility];  Service: Endoscopy;  Laterality: N/A;  1:30 / ASA 3   IR IMAGING GUIDED PORT INSERTION  04/19/2023   VIDEO BRONCHOSCOPY WITH ENDOBRONCHIAL ULTRASOUND Bilateral 04/16/2023   Procedure: VIDEO BRONCHOSCOPY WITH ENDOBRONCHIAL ULTRASOUND;  Surgeon: Josephine Igo, DO;  Location: [facility];  Service: Pulmonary;  Laterality: Bilateral;   Social History:  reports that she has quit smoking. Her smoking use included cigarettes. She has a 60 pack-year smoking history. She has never used smokeless tobacco. She reports current alcohol use. She reports that she does not use drugs.  Allergies  Allergen Reactions   Codeine Itching, Other (See Comments) and Rash    REACTION: Severe itching, feels like skin is crawling    History reviewed. No pertinent family  history.  Prior to Admission medications   Medication Sig Start Date End Date Taking? Authorizing Provider  albuterol (PROVENTIL HFA;VENTOLIN HFA) 108 (90 BASE) MCG/ACT inhaler Inhale 2 puffs into the lungs every 6 (six) hours as needed for shortness of breath or wheezing.   Yes [provider]   albuterol (PROVENTIL) (2.5 MG/3ML) 0.083% nebulizer solution Take 2.5 mg by nebulization every 6 (six) hours as needed for wheezing or shortness of breath.   Yes [provider]  aspirin EC 81 MG tablet Take 81 mg by mouth daily. Swallow whole.   Yes [provider]  Aspirin-Acetaminophen-Caffeine (GOODYS EXTRA STRENGTH) [phone] MG PACK Take 1 packet by mouth 2 (two) times daily as needed (pain.).   Yes [provider]  baclofen (LIORESAL) 10 MG tablet Take 10 mg by mouth every 8 (eight) hours as needed. 03/26/23  Yes [provider]  CARBOPLATIN IV Inject into the vein every 21 ( twenty-one) days. 04/23/23  Yes [provider]  Cholecalciferol (VITAMIN D-3) 125 MCG (5000 UT) TABS Take 5,000 Units by mouth in the morning.   Yes [provider]  Durvalumab (IMFINZI IV) Inject into the vein every 21 ( twenty-one) days. 04/23/23  Yes [provider]  ETOPOSIDE IV Inject into the vein every 21 ( twenty-one) days. 04/23/23  Yes [provider]  lidocaine-prilocaine (EMLA) cream Apply to affected area once 04/23/23  Yes Doreatha Massed, MD  lovastatin (MEVACOR) 20 MG tablet Take 20 mg by mouth every morning.   Yes [provider]  omeprazole (PRILOSEC) 20 MG capsule Take 20 mg by mouth daily before breakfast.   Yes [provider]  PARoxetine (PAXIL) 20 MG tablet Take 20 mg by mouth in the morning.   Yes [provider]  potassium chloride (KLOR-CON) 10 MEQ tablet Take 10 mEq by mouth every morning. 03/22/23  Yes [provider]  prochlorperazine (COMPAZINE) 10 MG tablet Take 1 tablet (10 mg total) by mouth every 6 (six) hours as needed for nausea or vomiting. 04/18/23  Yes Doreatha Massed, MD  simethicone (MYLICON) 125 MG chewable tablet Chew 125 mg by mouth every 6 (six) hours as needed for flatulence.   Yes [provider]  verapamil (CALAN) 120 MG tablet Take 240 mg by mouth 2 (two)  times daily.   Yes [provider]  furosemide (LASIX) 40 MG tablet Take 20 mg by mouth daily as needed for fluid or edema. Patient not taking: Reported on 05/02/2023 03/18/23   [provider]  zolpidem (AMBIEN) 5 MG tablet Take 5 mg by mouth at bedtime. Patient not taking: Reported on 05/02/2023 04/10/23   [provider]    Physical Exam: Vitals:   05/02/23 0846 05/02/23 1134 05/02/23 1458 05/02/23 1709  BP:   124/77 129/69  Pulse:   99 99  Resp:   20 16  Temp: 98.9 F (37.2 C)  98.8 F (37.1 C) 97.9 F (36.6 C)  TempSrc: Oral  Oral   SpO2:  98% 100% 100%  Weight:      Height:       General exam: Alert, awake, oriented x 3; complaining of generalized pain, mild difficulty speaking in full sentences and diffuse expiratory wheezing appreciated on exam. Respiratory system: Positive tachypnea, expiratory wheezing appreciated bilaterally; no using accessory muscle.  Positive rhonchi.  4-5 L nasal cannula in place. Cardiovascular system:Sinus tachycardia, no rubs, no gallops, no JVD. Gastrointestinal system: Abdomen is nondistended, soft and nontender. No organomegaly or masses felt.  Normal bowel sounds heard. Central nervous system: Moving 4 limbs; generally weak.  No focal neurological deficits. Extremities: No cyanosis or clubbing; no edema. Skin: No petechiae. Psychiatry: Judgement and insight appear normal.  Flat affect appreciated.  Data Reviewed: As reported in HPI section.  Assessment and Plan: * Hypokalemia -In the setting of GI losses (from diarrhea) and decreased oral intake. -Provide electrolyte repletion and follow clinical response.  Thrombocytopenia (HCC) -Patient with thrombocytopenia and neutropenia most likely chemotherapy related -No overt bleeding -Focusing on symptomatic management and comfort care -No further chemo will be pursued.  Chronic respiratory failure with hypoxia (HCC) -Chronically on 3-4 L at home -Requiring up to 5 L  to stabilize her air hunger; adequate saturation appreciated. -Most likely in the setting of COPD exacerbation and underlying lung cancer.  Small cell lung cancer, right upper lobe (HCC) -Stage IV cancer -Patient will like to stop chemotherapy and pursuit symptomatic management and hospice at home. -Palliative care consulted to assist with symptomatic management  -TOC aware for hospice referral and to assist with equipment/DME retrieval for home care.  GERD (gastroesophageal reflux disease) -Continue PPI -Antiemetics and the use of Lomotil will be provided.  Hyperlipidemia -Stopping statin -After following discussion and plan of care with patient and daughter, will pursue symptomatic management only.  Essential hypertension -Stopping/discontinuing antihypertensive agents at the moment -Follow vital signs. -Patient interested to pursued hospice at home and symptomatic management only.  Hyponatremia -In the setting of dehydration, GI losses and poor oral intake -Follow electrolytes and provide gentle hydration -Main goal of symptomatic management.  Dehydration -Gentle fluid resuscitation and electrolyte repletion will be provided -Patient will receive comfort feeding.  COPD with acute exacerbation (HCC) -Diffuse bilateral expiratory wheezing and positive tachypnea. -Treatment with nebulized bronchodilators, analgesics for air hunger and steroids treatment will be provided. -Patient interested in not heroic measures and address symptomatic management and comfort only.      Advance Care Planning:   Code Status: Limited: Do not attempt resuscitation (DNR) -DNR-LIMITED -Do Not Intubate/DNI    Consults: Palliative care  Family Communication: Daughter at bedside  Severity of Illness: The appropriate patient status for this patient is OBSERVATION. Observation status is judged to be reasonable and necessary in order to provide the required intensity of service to ensure the  patient's safety. The patient's presenting symptoms, physical exam findings, and initial radiographic and laboratory data in the context of their medical condition is felt to place them at decreased risk for further clinical deterioration. Furthermore, it is anticipated that the patient will be medically stable for discharge from the hospital within 2 midnights of admission.   Author: Vassie Loll, MD 05/02/2023 7:11 PM  For on call review [email or web address].

## 2023-05-02 NOTE — Assessment & Plan Note (Signed)
-  Stage IV cancer -Patient will like to stop chemotherapy and pursuit symptomatic management and hospice at home. -Palliative care consulted to assist with symptomatic management  -TOC aware for hospice referral and to assist with equipment/DME retrieval for home care.

## 2023-05-02 NOTE — ED Notes (Signed)
Pt questioning admission thought she was being admitted just for hospice bed. Pt informed after this RN spoke with the MD that she is being admitted for her hypokalemia and that we encourage her to stay. Pt in agreement at this time.

## 2023-05-02 NOTE — Assessment & Plan Note (Signed)
-  In the setting of dehydration, GI losses and poor oral intake -Follow electrolytes and provide gentle hydration -Main goal of symptomatic management.

## 2023-05-02 NOTE — ED Provider Notes (Signed)
[place] EMERGENCY DEPARTMENT AT [facility] Provider Note   CSN: [id number] Arrival date & time: 05/02/23  4098     History  Chief Complaint  Patient presents with   Shortness of Breath    Kathleen Bennett is a 62 y.o. female.  She has a history of COPD and a fairly recent diagnosis of metastatic small cell lung cancer.  She follows with Dr. Ellin Saba and has started chemotherapy.  Lives at home with her husband.  Brought in by ambulance for worsening shortness of breath, general pain all over, diarrhea.  She said she cannot care for herself at home anymore.  She does not think she has had a fever.  Has a cough it has been productive of some phlegm.  Nausea but really no vomiting.  Unable to walk, tried to stand up today but was so weak collapsed back into bed.  The history is provided by the patient and the EMS personnel.  Shortness of Breath Severity:  Moderate Timing:  Constant Progression:  Worsening Relieved by:  Nothing Ineffective treatments:  Oxygen Associated symptoms: cough and sputum production   Associated symptoms: no abdominal pain, no chest pain, no fever, no hemoptysis and no vomiting        Home Medications Prior to Admission medications   Medication Sig Start Date End Date Taking? Authorizing Provider  albuterol (PROVENTIL HFA;VENTOLIN HFA) 108 (90 BASE) MCG/ACT inhaler Inhale 2 puffs into the lungs every 6 (six) hours as needed for shortness of breath or wheezing.    [provider]  albuterol (PROVENTIL) (2.5 MG/3ML) 0.083% nebulizer solution Take 2.5 mg by nebulization every 6 (six) hours as needed for wheezing or shortness of breath.    [provider]  aspirin EC 81 MG tablet Take 81 mg by mouth daily. Swallow whole.    [provider]  Aspirin-Acetaminophen-Caffeine (GOODYS EXTRA STRENGTH) [phone] MG PACK Take 1 packet by mouth 2 (two) times daily as needed (pain.).    [provider]  baclofen (LIORESAL)  10 MG tablet Take 10 mg by mouth every 8 (eight) hours as needed. 03/26/23   [provider]  CARBOPLATIN IV Inject into the vein every 21 ( twenty-one) days. 04/23/23   [provider]  Cholecalciferol (VITAMIN D-3) 125 MCG (5000 UT) TABS Take 5,000 Units by mouth in the morning.    [provider]  Durvalumab (IMFINZI IV) Inject into the vein every 21 ( twenty-one) days. 04/23/23   [provider]  ETOPOSIDE IV Inject into the vein every 21 ( twenty-one) days. 04/23/23   [provider]  furosemide (LASIX) 40 MG tablet Take 20 mg by mouth daily as needed for fluid or edema. 03/18/23   [provider]  Lactulose 20 GM/30ML SOLN Take 30 mLs (20 g total) by mouth daily. 04/18/23   Doreatha Massed, MD  lidocaine (XYLOCAINE) 2 % solution SMARTSIG:By Mouth 04/16/23   [provider]  lidocaine-prilocaine (EMLA) cream Apply to affected area once 04/23/23   Doreatha Massed, MD  lovastatin (MEVACOR) 20 MG tablet Take 20 mg by mouth every morning.    [provider]  megestrol (MEGACE) 400 MG/10ML suspension Place 10 mLs (400 mg total) into feeding tube daily. 04/18/23   Doreatha Massed, MD  Multiple Vitamin (MULTIVITAMIN WITH MINERALS) TABS tablet Take 1 tablet by mouth daily. 04/05/23   Johnson, Clanford L, MD  omeprazole (PRILOSEC) 20 MG capsule Take 20 mg by mouth daily before breakfast.    [provider]  PARoxetine (PAXIL) 20 MG tablet Take 20 mg by mouth in the morning.    [provider]  potassium chloride (KLOR-CON) 10 MEQ tablet Take 10 mEq by mouth every morning. 03/22/23   [provider]  prochlorperazine (COMPAZINE) 10 MG tablet Take 1 tablet (10 mg total) by mouth every 6 (six) hours as needed for nausea or vomiting. 04/18/23   Doreatha Massed, MD  simethicone (MYLICON) 125 MG chewable tablet Chew 125 mg by mouth every 6 (six) hours as needed for flatulence.    [provider]   SYMBICORT 80-4.5 MCG/ACT inhaler Inhale 2 puffs into the lungs in the morning and at bedtime. 05/16/15   [provider]  traMADol (ULTRAM) 50 MG tablet Take 1 tablet (50 mg total) by mouth every 6 (six) hours as needed. 04/18/23   Doreatha Massed, MD  verapamil (CALAN) 120 MG tablet Take 240 mg by mouth 2 (two) times daily.    [provider]  vitamin B-12 (CYANOCOBALAMIN) 1000 MCG tablet Take 1,000 mcg by mouth in the morning.    [provider]  zolpidem (AMBIEN) 5 MG tablet Take 5 mg by mouth at bedtime. 04/10/23   [provider]      Allergies    Codeine    Review of Systems   Review of Systems  Constitutional:  Positive for appetite change and fatigue. Negative for fever.  Respiratory:  Positive for cough, sputum production and shortness of breath. Negative for hemoptysis.   Cardiovascular:  Negative for chest pain.  Gastrointestinal:  Positive for diarrhea and nausea. Negative for abdominal pain and vomiting.  Genitourinary:  Negative for dysuria.  Musculoskeletal:  Positive for arthralgias and myalgias.  Neurological:  Positive for dizziness.    Physical Exam Updated Vital Signs BP 105/75   Pulse (!) 114   Resp (!) 28   Ht 5\' 4"  (1.626 m)   Wt 46.9 kg   SpO2 97%   BMI 17.75 kg/m  Physical Exam Vitals and nursing note reviewed.  Constitutional:      General: She is not in acute distress.    Appearance: She is underweight. She is ill-appearing.  HENT:     Head: Normocephalic and atraumatic.  Eyes:     Conjunctiva/sclera: Conjunctivae normal.  Cardiovascular:     Rate and Rhythm: Regular rhythm. Tachycardia present.     Heart sounds: No murmur heard. Pulmonary:     Effort: Tachypnea and accessory muscle usage present. No respiratory distress.     Breath sounds: Normal breath sounds.  Chest:    Abdominal:     Palpations: Abdomen is soft.     Tenderness: There is no abdominal tenderness. There is no guarding or rebound.   Musculoskeletal:        General: No deformity.     Cervical back: Neck supple.  Skin:    General: Skin is warm and dry.     Capillary Refill: Capillary refill takes less than 2 seconds.  Neurological:     General: No focal deficit present.     Mental Status: She is alert.     Motor: No weakness.     ED Results / Procedures / Treatments   Labs (all labs ordered are listed, but only abnormal results are displayed) Labs Reviewed  COMPREHENSIVE METABOLIC PANEL - Abnormal; Notable for the following components:      Result Value   Sodium 130 (*)    Potassium 2.1 (*)    Chloride 88 (*)  Calcium 7.7 (*)    Total Protein 6.2 (*)    Albumin 2.7 (*)    AST 14 (*)    Total Bilirubin 1.4 (*)    All other components within normal limits  MAGNESIUM - Abnormal; Notable for the following components:   Magnesium 1.2 (*)    All other components within normal limits  CBC WITH DIFFERENTIAL/PLATELET - Abnormal; Notable for the following components:   WBC 0.2 (*)    RBC 3.79 (*)    Platelets 22 (*)    Neutro Abs 0.0 (*)    Lymphs Abs 0.2 (*)    Monocytes Absolute 0.0 (*)    All other components within normal limits  SARS CORONAVIRUS 2 BY RT PCR  CULTURE, BLOOD (ROUTINE X 2)  LIPASE, BLOOD  LACTIC ACID, PLASMA  LACTIC ACID, PLASMA  CBC WITH DIFFERENTIAL/PLATELET  URINALYSIS, W/ REFLEX TO CULTURE (INFECTION SUSPECTED)  CBG MONITORING, ED  TYPE AND SCREEN    EKG EKG Interpretation Date/Time:  Thursday May 02 2023 08:50:03 EDT Ventricular Rate:  113 PR Interval:  77 QRS Duration:  76 QT Interval:  323 QTC Calculation: 443 R Axis:   10  Text Interpretation: Ectopic atrial tachycardia, unifocal RSR' in V1 or V2, right VCD or RVH Nonspecific T abnormalities, lateral leads rate increased and nonspecific t waves from prior 8/24 Confirmed by Meridee Score [phone]) on 05/02/2023 8:58:55 AM  Radiology DG Chest Port 1 View  Result Date: 05/02/2023 CLINICAL DATA:  Shortness of  breath. EXAM: PORTABLE CHEST 1 VIEW COMPARISON:  April 21, 2023. FINDINGS: Stable cardiomediastinal silhouette. Right internal jugular Port-A-Cath is unchanged in position. No acute pulmonary disease is noted. Extensive degenerative changes are seen involving the left glenohumeral joint. IMPRESSION: No active disease. Electronically Signed   By: Lupita Raider M.D.   On: 05/02/2023 09:42    Procedures .Critical Care  Performed by: Terrilee Files, MD Authorized by: Terrilee Files, MD   Critical care provider statement:    Critical care time (minutes):  45   Critical care time was exclusive of:  Separately billable procedures and treating other patients   Critical care was necessary to treat or prevent imminent or life-threatening deterioration of the following conditions:  Respiratory failure and metabolic crisis   Critical care was time spent personally by me on the following activities:  Development of treatment plan with patient or surrogate, discussions with consultants, evaluation of patient's response to treatment, examination of patient, obtaining history from patient or surrogate, ordering and performing treatments and interventions, ordering and review of laboratory studies, ordering and review of radiographic studies, pulse oximetry, re-evaluation of patient's condition and review of old charts   I assumed direction of critical care for this patient from another provider in my specialty: no       Medications Ordered in ED Medications  budesonide (PULMICORT) nebulizer solution 0.5 mg (0.5 mg Nebulization Given 05/02/23 1134)  ipratropium-albuterol (DUONEB) 0.5-2.5 (3) MG/3ML nebulizer solution 3 mL (3 mLs Nebulization Given 05/02/23 1134)  prochlorperazine (COMPAZINE) injection 10 mg (has no administration in time range)  methylPREDNISolone sodium succinate (SOLU-MEDROL) 40 mg/mL injection 40 mg (40 mg Intravenous Given 05/02/23 1228)  dextrose 5 % and 0.9 % NaCl with KCl 40  mEq/L infusion ( Intravenous New Bag/Given 05/02/23 1454)  diphenoxylate-atropine (LOMOTIL) 2.5-0.025 MG per tablet 1 tablet (has no administration in time range)  acetaminophen (TYLENOL) tablet 650 mg (has no administration in time range)    Or  acetaminophen (TYLENOL) suppository  650 mg (has no administration in time range)  HYDROmorphone (DILAUDID) injection 1 mg (1 mg Intravenous Given 05/02/23 1449)  LORazepam (ATIVAN) tablet 1 mg (has no administration in time range)  acetaminophen (TYLENOL) tablet 650 mg (has no administration in time range)  fentaNYL (DURAGESIC) 12 MCG/HR 1 patch (has no administration in time range)  oxyCODONE (ROXICODONE) 5 MG/5ML solution 10 mg (has no administration in time range)  sodium chloride 0.9 % bolus 500 mL (0 mLs Intravenous Stopped 05/02/23 1156)  magnesium sulfate IVPB 2 g 50 mL (0 g Intravenous Stopped 05/02/23 1156)  potassium chloride SA (KLOR-CON M) CR tablet 40 mEq (40 mEq Oral Given 05/02/23 1451)    ED Course/ Medical Decision Making/ A&P Clinical Course as of 05/02/23 1709  Thu May 02, 2023  0915 Chest x-ray interpreted by me as no acute infiltrate.  Awaiting radiology reading. [MB]  1032 Discussed with Dr. Gwenlyn Perking [facility] hospitalist who will evaluate patient for admission. [MB]    Clinical Course User Index [MB] Terrilee Files, MD                                 Medical Decision Making Amount and/or Complexity of Data Reviewed Labs: ordered. Radiology: ordered.  Risk Prescription drug management. Decision regarding hospitalization.   This patient complains of general weakness pain shortness of breath diarrhea; this involves an extensive number of treatment Options and is a complaint that carries with it a high risk of complications and morbidity. The differential includes failure to thrive, metastatic cancer, metabolic derangement, dehydration  I ordered, reviewed and interpreted labs, which included CBC with low white count low  platelets, chemistries with low potassium, magnesium low, lactate normal, COVID-negative I ordered medication IV fluids IV pain medication, IV magnesium and reviewed PMP when indicated. I ordered imaging studies which included chest x-ray and I independently    visualized and interpreted imaging which showed no acute findings Additional history obtained from EMS Previous records obtained and reviewed in epic including recent oncology notes I consulted Dr. Gwenlyn Perking [facility] hospitalist and discussed lab and imaging findings and discussed disposition.  Cardiac monitoring reviewed, atrial tachycardia Social determinants considered, food insecurity Critical Interventions: Initiation of hydration and correction of metabolic derangement,  After the interventions stated above, I reevaluated the patient and found patient still to be rather weak appearing Admission and further testing considered, she would benefit from mission to the hospital for further treatment and establishment of goals of care.         Final Clinical Impression(s) / ED Diagnoses Final diagnoses:  Primary malignant neoplasm of lung metastatic to other site, unspecified laterality (HCC)  Hypokalemia  Hypomagnesemia    Rx / DC Orders ED Discharge Orders     None         Terrilee Files, MD 05/02/23 1713

## 2023-05-02 NOTE — Assessment & Plan Note (Signed)
-  Stopping/discontinuing antihypertensive agents at the moment -Follow vital signs. -Patient interested to pursued hospice at home and symptomatic management only.

## 2023-05-02 NOTE — Assessment & Plan Note (Signed)
-  Continue PPI -Antiemetics and the use of Lomotil will be provided.

## 2023-05-02 NOTE — Assessment & Plan Note (Signed)
-  Diffuse bilateral expiratory wheezing and positive tachypnea. -Treatment with nebulized bronchodilators, analgesics for air hunger and steroids treatment will be provided. -Patient interested in not heroic measures and address symptomatic management and comfort only.

## 2023-05-03 LAB — BLOOD CULTURE ID PANEL (REFLEXED) - BCID2

## 2023-05-06 ENCOUNTER — Encounter: Payer: Self-pay | Admitting: Acute Care

## 2023-05-06 LAB — CULTURE, BLOOD (ROUTINE X 2): Special Requests: ADEQUATE

## 2023-05-08 NOTE — Discharge Summary (Signed)
Physician Discharge Summary   Patient: Kathleen Bennett MRN: [id number] DOB: 12/26/1960  Admit date:     05/02/2023  Discharge date: 05/02/2023  Discharge Physician: Vassie Loll   PCP: Juel Burrow, NP   Recommendations at discharge:  Patient left AGAINST MEDICAL ADVICE.  Discharge Diagnoses: Principal Problem:   Hypokalemia Active Problems:   COPD with acute exacerbation (HCC)   Dehydration   Hyponatremia   Essential hypertension   Hyperlipidemia   GERD (gastroesophageal reflux disease)   Small cell lung cancer, right upper lobe (HCC)   Chronic respiratory failure with hypoxia (HCC)   Thrombocytopenia [facility])  Brief Hospital Course: Kathleen Bennett is a 62 y.o. 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  Patient expressed symptoms has been present for the last 3 days and worsening.  She has recently started chemotherapy and expressed feeling terrible since then.  No nausea, no vomiting, no dysuria, no hematuria, no focal weaknesses, patient also reported no melena or hematochezia.   Daughter at bedside expressed that patient has not been eating much, she has not been as ambulatory as she was before and is essentially expressing the need of adjusted medication for her pain, improvement in her breathing and more than anything trying to be comfortable.   After discussion in the ED patient will like to stop chemotherapy, pursued comfort care and hospice at home.    Chest x-ray demonstrating no acute cardiopulmonary process.  COVID PCR negative; patient CBC demonstrating neutropenia and thrombocytopenia most  likely associated with chemotherapy.  No Active bleeding.   Comprehensive metabolic panel: Sodium 130, potassium 2.1, chloride 88, bicarb 27, BUN 12, creatinine 0.68, albumin 2.7, normal LFTs.  Patient was left with intention to come into the hospital for symptomatic management and treatment of reversible abnormalities prior to set hospice at home and discharge her with intention for comfort management as an outpatient.  Appears that she left AGAINST MEDICAL ADVICE.   Assessment and Plan of care intended to be provided at time of admission: * Hypokalemia -In the setting of GI losses (from diarrhea) and decreased oral intake. -Provide electrolyte repletion and follow clinical response.  Thrombocytopenia (HCC) -Patient with thrombocytopenia and neutropenia most likely chemotherapy related -No overt bleeding -Focusing on symptomatic management and comfort care -No further chemo will be pursued.  Chronic respiratory failure with hypoxia (HCC) -Chronically on 3-4 L at home -Requiring up to 5 L to stabilize her air hunger; adequate saturation appreciated. -Most likely in the setting of COPD exacerbation and underlying lung cancer.  Small cell lung cancer, right upper lobe (HCC) -Stage IV cancer -Patient will like to stop chemotherapy and pursuit symptomatic management and hospice at home. -Palliative care consulted to assist with symptomatic management  -TOC aware for hospice referral and to assist with equipment/DME retrieval for home care.  GERD (gastroesophageal reflux disease) -Continue PPI -Antiemetics and the use of Lomotil will be provided.  Hyperlipidemia -Stopping statin -After following discussion and plan of care with patient and daughter, will pursue symptomatic management only.  Essential hypertension -Stopping/discontinuing antihypertensive agents at the moment -Follow vital signs. -Patient interested to pursued hospice at home and symptomatic management  only.  Hyponatremia -In the setting of dehydration,  GI losses and poor oral intake -Follow electrolytes and provide gentle hydration -Main goal of symptomatic management.  Dehydration -Gentle fluid resuscitation and electrolyte repletion will be provided -Patient will receive comfort feeding.  COPD with acute exacerbation (HCC) -Diffuse bilateral expiratory wheezing and positive tachypnea. -Treatment with nebulized bronchodilators, analgesics for air hunger and steroids treatment will be provided. -Patient interested in not heroic measures and address symptomatic management and comfort only.   Consultants: Palliative care Procedures performed: See below for x-ray reports. Disposition: plan was for home with hospice. Diet recommendation: patient left against medical advice; plan would have been for comfort feeding diet.   DISCHARGE MEDICATION: Allergies as of 05/02/2023       Reactions   Codeine Itching, Other (See Comments), Rash   REACTION: Severe itching, feels like skin is crawling        Medication List     TAKE these medications    albuterol 108 (90 Base) MCG/ACT inhaler Commonly known as: VENTOLIN HFA Inhale 2 puffs into the lungs every 6 (six) hours as needed for shortness of breath or wheezing.   albuterol (2.5 MG/3ML) 0.083% nebulizer solution Commonly known as: PROVENTIL Take 2.5 mg by nebulization every 6 (six) hours as needed for wheezing or shortness of breath.   aspirin EC 81 MG tablet Take 81 mg by mouth daily. Swallow whole.   baclofen 10 MG tablet Commonly known as: LIORESAL Take 10 mg by mouth every 8 (eight) hours as needed.   CARBOPLATIN IV Inject into the vein every 21 ( twenty-one) days.   ETOPOSIDE IV Inject into the vein every 21 ( twenty-one) days.   furosemide 40 MG tablet Commonly known as: LASIX Take 20 mg by mouth daily as needed for fluid or edema.   Goodys Extra Strength [id number] MG Pack Generic drug:  Aspirin-Acetaminophen-Caffeine Take 1 packet by mouth 2 (two) times daily as needed (pain.).   IMFINZI IV Inject into the vein every 21 ( twenty-one) days.   lidocaine-prilocaine cream Commonly known as: EMLA Apply to affected area once   lovastatin 20 MG tablet Commonly known as: MEVACOR Take 20 mg by mouth every morning.   omeprazole 20 MG capsule Commonly known as: PRILOSEC Take 20 mg by mouth daily before breakfast.   PARoxetine 20 MG tablet Commonly known as: PAXIL Take 20 mg by mouth in the morning.   potassium chloride 10 MEQ tablet Commonly known as: KLOR-CON Take 10 mEq by mouth every morning.   prochlorperazine 10 MG tablet Commonly known as: COMPAZINE Take 1 tablet (10 mg total) by mouth every 6 (six) hours as needed for nausea or vomiting.   simethicone 125 MG chewable tablet Commonly known as: MYLICON Chew 125 mg by mouth every 6 (six) hours as needed for flatulence.   verapamil 120 MG tablet Commonly known as: CALAN Take 240 mg by mouth 2 (two) times daily.   Vitamin D-3 125 MCG (5000 UT) Tabs Take 5,000 Units by mouth in the morning.   zolpidem 5 MG tablet Commonly known as: AMBIEN Take 5 mg by mouth at bedtime.        Discharge Exam: Filed Weights   05/02/23 0833  Weight: 46.9 kg   Condition at discharge: Patient left AGAINST MEDICAL ADVICE.  The results of significant diagnostics from this hospitalization (including imaging, microbiology, ancillary and laboratory) are listed below for reference.   Imaging Studies: DG Chest Port 1 View  Result Date: 05/02/2023 CLINICAL DATA:  Shortness of breath. EXAM: PORTABLE CHEST 1 VIEW  COMPARISON:  April 21, 2023. FINDINGS: Stable cardiomediastinal silhouette. Right internal jugular Port-A-Cath is unchanged in position. No acute pulmonary disease is noted. Extensive degenerative changes are seen involving the left glenohumeral joint. IMPRESSION: No active disease. Electronically Signed   By: Lupita Raider M.D.   On: 05/02/2023 09:42   DG Chest Port 1 View  Result Date: 04/21/2023 CLINICAL DATA:  Shortness of breath. Status post porta catheter placement. There is now blood at the porta catheter insertion site. EXAM: PORTABLE CHEST 1 VIEW COMPARISON:  PET-CT 04/11/2023. FINDINGS: There is a right chest wall port a catheter. The tip is in the projection of the cavoatrial junction. No pneumothorax identified. Heart size and mediastinal contours are stable. No pleural fluid or interstitial edema. The previously noted pleural base nodule overlying the anterolateral right upper lobe is again seen and appears unchanged. The right upper lobe anteromedial perihilar lung mass is suboptimally visualized by portable technique. Chronic deformity of the left humeral head is again noted. IMPRESSION: 1. Right chest wall port a catheter tip is in the projection of the cavoatrial junction. No pneumothorax identified. 2. Stable appearance of right upper lobe pleural base nodule. 3. Right upper lobe anteromedial perihilar lung mass is suboptimally visualized by portable technique. Electronically Signed   By: Signa Kell M.D.   On: 04/21/2023 11:53   MR Brain W Wo Contrast  Result Date: 04/20/2023 CLINICAL DATA:  Small cell lung cancer (SCLC), staging new blurry vision EXAM: MRI HEAD WITHOUT AND WITH CONTRAST TECHNIQUE: Multiplanar, multiecho pulse sequences of the brain and surrounding structures were obtained without and with intravenous contrast. CONTRAST:  4mL GADAVIST GADOBUTROL 1 MMOL/ML IV SOLN COMPARISON:  None Available. FINDINGS: Brain: Intracranial and extracranial extension of a calvarial metastases is detailed below. No intraparenchymal metastasis identified. Patchy T2/FLAIR hyperintensities in the white matter are nonspecific but compatible with chronic microvascular ischemic change. No evidence of acute infarct, acute hemorrhage, midline shift or hydrocephalus. Cerebral atrophy. Vascular: Major arterial  flow voids are maintained at the skull base. Skull and upper cervical spine: Many areas of abnormal marrow signal and enhancement associated with the calvarium, skull base, imaged jaws, and visualized cervical spine, compatible with osseous metastasis. This includes a right parietal calvarial metastasis that has a soft tissue extracranial and intracranial component. Intracranial enhancement of abuts the adjacent right parietal lobe. Milder dural thickening enhancement along bilateral frontal convexities, left temporal, and left parietal convexity. Sinuses/Orbits: Clear sinuses. No obvious acute orbital abnormality. Other: No sizable mastoid effusions. IMPRESSION: 1. Numerous bony metastasis throughout the calvarium, skull base, face/jaws, and visualized upper cervical spine. 2. A right parietal bony lesion has a soft tissue component with both extracranial/scalp and intracranial extension of tumor. Tumor abuts the right parietal lobe without brain edema. Multiple additional areas of milder probable dural involvement along both cerebral convexities. 3. Extraosseous extension of tumor involving the jaws (bulky on the right) and likely the sphenoid bones. An MRI of the face and neck with contrast could further characterize if clinically warranted. 4. No intraparenchymal metastatic disease identified. Electronically Signed   By: Feliberto Harts M.D.   On: 04/20/2023 19:41   IR IMAGING GUIDED PORT INSERTION  Result Date: 04/19/2023 INDICATION: 62 year old female with history of lung cancer requiring central venous access for chemotherapy administration. EXAM: IMPLANTED PORT A CATH PLACEMENT WITH ULTRASOUND AND FLUOROSCOPIC GUIDANCE COMPARISON:  None Available. MEDICATIONS: None. ANESTHESIA/SEDATION: Moderate (conscious) sedation was employed during this procedure. A total of Versed 3 mg and Fentanyl 100 mcg  was administered intravenously. Moderate Sedation Time: 15 minutes. The patient's level of consciousness and  vital signs were monitored continuously by radiology nursing throughout the procedure under my direct supervision. CONTRAST:  None FLUOROSCOPY TIME:  One mGy COMPLICATIONS: None immediate. PROCEDURE: The procedure, risks, benefits, and alternatives were explained to the patient. Questions regarding the procedure were encouraged and answered. The patient understands and consents to the procedure. The right neck and chest were prepped with chlorhexidine in a sterile fashion, and a sterile drape was applied covering the operative field. Maximum barrier sterile technique with sterile gowns and gloves were used for the procedure. A timeout was performed prior to the initiation of the procedure. Ultrasound was used to examine the jugular vein which was compressible and free of internal echoes. A skin marker was used to demarcate the planned venotomy and port pocket incision sites. Local anesthesia was provided to these sites and the subcutaneous tunnel track with 1% lidocaine with 1:100,000 epinephrine. A small incision was created at the jugular access site and blunt dissection was performed of the subcutaneous tissues. Under ultrasound guidance, the jugular vein was accessed with a 21 ga micropuncture needle and an 0.018" wire was inserted to the superior vena cava. Real-time ultrasound guidance was utilized for vascular access including the acquisition of a permanent ultrasound image documenting patency of the accessed vessel. A 5 Fr micopuncture set was then used, through which a 0.035" Rosen wire was passed under fluoroscopic guidance into the inferior vena cava. An 8 Fr dilator was then placed over the wire. A subcutaneous port pocket was then created along the upper chest wall utilizing a combination of sharp and blunt dissection. The pocket was irrigated with sterile saline, packed with gauze, and observed for hemorrhage. A single lumen "ISP" sized power injectable port was chosen for placement. The 8 Fr catheter  was tunneled from the port pocket site to the venotomy incision. The port was placed in the pocket. The external catheter was trimmed to appropriate length. The dilator was exchanged for an 8 Fr peel-away sheath under fluoroscopic guidance. The catheter was then placed through the sheath and the sheath was removed. Final catheter positioning was confirmed and documented with a fluoroscopic spot radiograph. The port was accessed with a Huber needle, aspirated, and flushed with heparinized saline. The deep dermal layer of the port pocket incision was closed with interrupted 3-0 Vicryl suture. Dermabond was then placed over the port pocket and neck incisions. The patient tolerated the procedure well without immediate post procedural complication. FINDINGS: After catheter placement, the tip lies within the superior cavoatrial junction. The catheter aspirates and flushes normally and is ready for immediate use. IMPRESSION: Successful placement of a power injectable Port-A-Cath via the right internal jugular vein. The catheter is ready for immediate use. Marliss Coots, MD [facility] Electronically Signed   By: Marliss Coots M.D.   On: 04/19/2023 11:40   NM PET Image Initial (PI) Skull Base To Thigh (F-18 FDG)  Result Date: 04/15/2023 CLINICAL DATA:  Initial treatment strategy for right upper lobe lung mass. EXAM: NUCLEAR MEDICINE PET SKULL BASE TO THIGH TECHNIQUE: 6.2 mCi F-18 FDG was injected intravenously. Full-ring PET imaging was performed from the skull base to thigh after the radiotracer. CT data was obtained and used for attenuation correction and anatomic localization. Fasting blood glucose: 143 mg/dl COMPARISON:  16/05/9603 chest CT angiogram FINDINGS: Mediastinal blood pool activity: SUV max 2.1 Liver activity: SUV max NA NECK: No hypermetabolic  lymph nodes in the neck. Incidental CT findings: None. CHEST: Hypermetabolic irregular solid right upper  lobe 6.0 x 3.2 cm lung mass with max SUV 10.1 (series 203/image 16) with numerous surrounding satellite pulmonary nodules up to 0.8 cm in the medial right upper lobe (series 203/image 14), not appreciably changed from 04/02/2023 chest CT angiogram study. Hypermetabolic solid peripheral right upper lobe subpleural 1.7 cm pulmonary nodule with max SUV 4.5 (series 203/image 22), stable from recent CTA. No hypermetabolic left lung findings. Enlarged hypermetabolic 2.6 cm short axis diameter right hilar lymph node with max SUV 9.0. No enlarged or hypermetabolic left hilar nodes. Low right paratracheal small 0.7 cm node demonstrates low level hypermetabolism with max SUV 2.3 (series 203/image 21). No additional enlarged or hypermetabolic mediastinal nodes. No enlarged or hypermetabolic axillary nodes. Incidental CT findings: Moderate centrilobular emphysema. Coronary atherosclerosis. Atherosclerotic nonaneurysmal thoracic aorta. Similar small anterior pericardial effusion. ABDOMEN/PELVIS: Tiny focus of hypermetabolism in far inferior right liver with max SUV 3.3, without discrete correlate on the noncontrast CT images. No additional foci of liver hypermetabolism. No abnormal hypermetabolic activity within the pancreas, adrenal glands, or spleen. No hypermetabolic lymph nodes in the abdomen or pelvis. Incidental CT findings: Atherosclerotic nonaneurysmal abdominal aorta. Mild sigmoid diverticulosis. Apparent hysterectomy. SKELETON: Numerous hypermetabolic osseous lesions scattered throughout the bilateral pelvic girdle, spine, ribs and right mandible with a subtle moth-eaten appearance on the corresponding CT images. Representative lesions as follows: -right mandible neck lesion with max SUV 6.4 (series 202/image 16) -anterolateral right fourth rib lesion with max SUV 4.6 (series 202/image 51) -L4 vertebral lesion with max SUV 7.6 -medial left iliac bone lesion with max SUV 10.0 -left ischium lesion with max SUV 5.2 -left  symphysis pubis lesion with max SUV 6.3 Incidental CT findings: None. IMPRESSION: 1. Hypermetabolic irregular solid right upper lobe 6.0 cm lung mass with numerous surrounding satellite pulmonary nodules, compatible with primary bronchogenic malignancy. 2. Hypermetabolic solid peripheral right upper lobe subpleural 1.7 cm pulmonary nodule, compatible with intralobar pulmonary metastasis. 3. Hypermetabolic metastatic right hilar adenopathy. Low level hypermetabolism within a small 0.7 cm low right paratracheal lymph node, suspicious for ipsilateral mediastinal nodal metastasis. 4. Widespread hypermetabolic osseous metastases scattered throughout the axial skeleton as detailed. 5. Tiny focus of hypermetabolism in the far inferior right liver without discrete correlate on the noncontrast CT images, indeterminate for early liver metastasis versus artifact. MRI abdomen without and with IV contrast may be obtained for further characterization if clinically warranted. 6. Chronic findings include: Similar small anterior pericardial effusion. Coronary atherosclerosis. Mild sigmoid diverticulosis. Aortic Atherosclerosis (ICD10-I70.0) and Emphysema (ICD10-J43.9). Electronically Signed   By: Delbert Phenix M.D.   On: 04/15/2023 16:46    Microbiology: Results for orders placed or performed during the hospital encounter of 05/02/23  SARS Coronavirus 2 by RT PCR (hospital order, performed in [facility] hospital lab) *cepheid single result test* Anterior Nasal Swab     Status: None   Collection Time: 05/02/23  8:50 AM   Specimen: Anterior Nasal Swab  Result Value Ref Range Status   SARS Coronavirus 2 by RT PCR NEGATIVE NEGATIVE Final    Comment: (NOTE) SARS-CoV-2 target nucleic acids are NOT DETECTED.  The SARS-CoV-2 RNA is generally detectable in upper and lower respiratory specimens during the acute phase of infection. The lowest concentration of SARS-CoV-2 viral copies this assay can detect is 250 copies / mL. A  negative result does not preclude SARS-CoV-2 infection and should not be used as the sole basis for treatment or  other patient management decisions.  A negative result may occur with improper specimen collection / handling, submission of specimen other than nasopharyngeal swab, presence of viral mutation(s) within the areas targeted by this assay, and inadequate number of viral copies (<250 copies / mL). A negative result must be combined with clinical observations, patient history, and epidemiological information.  Fact Sheet for Patients:   RoadLapTop.co.za  Fact Sheet for Healthcare Providers: http://kim-miller.com/  This test is not yet approved or  cleared by the [place] FDA and has been authorized for detection and/or diagnosis of SARS-CoV-2 by FDA under an Emergency Use Authorization (EUA).  This EUA will remain in effect (meaning this test can be used) for the duration of the COVID-19 declaration under Section 564(b)(1) of the Act, 21 U.S.C. section 360bbb-3(b)(1), unless the authorization is terminated or revoked sooner.  Performed at [facility], [address], [place], [place] [address]   Culture, blood (routine x 2)     Status: Abnormal   Collection Time: 05/02/23  9:06 AM   Specimen: BLOOD  Result Value Ref Range Status   Specimen Description   Final    BLOOD BLOOD RIGHT HAND rt wrist Performed at [facility], [address] [place], [place] [address]    Special Requests   Final    BOTTLES DRAWN AEROBIC AND ANAEROBIC Blood Culture adequate volume Performed at [facility], [address] [place], [place] [address]    Culture  Setup Time   Final    GRAM NEGATIVE RODS AEROBIC BOTTLE ONLY CRITICAL RESULT CALLED TO, READ BACK BY AND VERIFIED WITH: SHERRIE RICHARDSON AT 0865 05/03/2023 BY T KENNEDY CRITICAL RESULT CALLED TO, READ BACK BY AND VERIFIED WITH: PHARMD W.ANDERSON AT 0940 ON 05/03/2023 BY T.SAAD. Performed  at [facility], [address], [place], [place] [address]    Culture PSEUDOMONAS AERUGINOSA (A)  Final   Report Status 05/06/2023 FINAL  Final   Organism ID, Bacteria PSEUDOMONAS AERUGINOSA  Final      Susceptibility   Pseudomonas aeruginosa - MIC*    CEFTAZIDIME 4 SENSITIVE Sensitive     CIPROFLOXACIN <=0.25 SENSITIVE Sensitive     GENTAMICIN <=1 SENSITIVE Sensitive     IMIPENEM 2 SENSITIVE Sensitive     PIP/TAZO 16 SENSITIVE Sensitive     CEFEPIME 2 SENSITIVE Sensitive     * PSEUDOMONAS AERUGINOSA  Blood Culture ID Panel (Reflexed)     Status: Abnormal   Collection Time: 05/02/23  9:06 AM  Result Value Ref Range Status   Enterococcus faecalis NOT DETECTED NOT DETECTED Final   Enterococcus Faecium NOT DETECTED NOT DETECTED Final   Listeria monocytogenes NOT DETECTED NOT DETECTED Final   Staphylococcus species NOT DETECTED NOT DETECTED Final   Staphylococcus aureus (BCID) NOT DETECTED NOT DETECTED Final   Staphylococcus epidermidis NOT DETECTED NOT DETECTED Final   Staphylococcus lugdunensis NOT DETECTED NOT DETECTED Final   Streptococcus species NOT DETECTED NOT DETECTED Final   Streptococcus agalactiae NOT DETECTED NOT DETECTED Final   Streptococcus pneumoniae NOT DETECTED NOT DETECTED Final   Streptococcus pyogenes NOT DETECTED NOT DETECTED Final   A.calcoaceticus-baumannii NOT DETECTED NOT DETECTED Final   Bacteroides fragilis NOT DETECTED NOT DETECTED Final   Enterobacterales NOT DETECTED NOT DETECTED Final   Enterobacter cloacae complex NOT DETECTED NOT DETECTED Final   Escherichia coli NOT DETECTED NOT DETECTED Final   Klebsiella aerogenes NOT DETECTED NOT DETECTED Final   Klebsiella oxytoca NOT DETECTED NOT DETECTED Final   Klebsiella pneumoniae NOT DETECTED  NOT DETECTED Final   Proteus species NOT DETECTED NOT DETECTED Final   Salmonella species NOT DETECTED NOT DETECTED Final   Serratia marcescens NOT DETECTED NOT DETECTED Final   Haemophilus influenzae NOT  DETECTED NOT DETECTED Final   Neisseria meningitidis NOT DETECTED NOT DETECTED Final   Pseudomonas aeruginosa DETECTED (A) NOT DETECTED Final    Comment: CRITICAL RESULT CALLED TO, READ BACK BY AND VERIFIED WITH: PHARMD W.ANDERSON AT 0940 ON 05/03/2023 BY T.SAAD.    Stenotrophomonas maltophilia NOT DETECTED NOT DETECTED Final   Candida albicans NOT DETECTED NOT DETECTED Final   Candida auris NOT DETECTED NOT DETECTED Final   Candida glabrata NOT DETECTED NOT DETECTED Final   Candida krusei NOT DETECTED NOT DETECTED Final   Candida parapsilosis NOT DETECTED NOT DETECTED Final   Candida tropicalis NOT DETECTED NOT DETECTED Final   Cryptococcus neoformans/gattii NOT DETECTED NOT DETECTED Final   CTX-M ESBL NOT DETECTED NOT DETECTED Final   Carbapenem resistance IMP NOT DETECTED NOT DETECTED Final   Carbapenem resistance KPC NOT DETECTED NOT DETECTED Final   Carbapenem resistance NDM NOT DETECTED NOT DETECTED Final   Carbapenem resistance VIM NOT DETECTED NOT DETECTED Final    Comment: Performed at [facility], [address], [place], [place] [address]    Labs: CBC: Recent Labs  Lab 05/02/23 0922  WBC 0.2*  NEUTROABS 0.0*  HGB 12.3  HCT 36.0  MCV 95.0  PLT 22*   Basic Metabolic Panel: Recent Labs  Lab 05/02/23 0927  NA 130*  K 2.1*  CL 88*  CO2 27  GLUCOSE 94  BUN 12  CREATININE 0.68  CALCIUM 7.7*  MG 1.2*   Liver Function Tests: Recent Labs  Lab 05/02/23 0927  AST 14*  ALT 20  ALKPHOS 86  BILITOT 1.4*  PROT 6.2*  ALBUMIN 2.7*   CBG: Recent Labs  Lab 05/02/23 1109  GLUCAP 83    Discharge time spent: less than 30 minutes.  Signed: Vassie Loll, MD [facility] 05/08/2023

## 2023-05-10 ENCOUNTER — Institutional Professional Consult (permissible substitution): Payer: Medicaid Other | Admitting: Internal Medicine

## 2023-05-14 ENCOUNTER — Inpatient Hospital Stay: Payer: Medicaid Other

## 2023-05-15 ENCOUNTER — Inpatient Hospital Stay: Payer: Medicaid Other

## 2023-05-16 ENCOUNTER — Inpatient Hospital Stay: Payer: Medicaid Other

## 2023-05-17 ENCOUNTER — Inpatient Hospital Stay: Payer: Medicaid Other

## 2023-05-21 DEATH — deceased

## 2023-06-04 ENCOUNTER — Inpatient Hospital Stay: Payer: Medicaid Other | Admitting: Hematology

## 2023-06-04 ENCOUNTER — Inpatient Hospital Stay: Payer: Medicaid Other

## 2023-06-05 ENCOUNTER — Inpatient Hospital Stay: Payer: Medicaid Other

## 2023-06-06 ENCOUNTER — Inpatient Hospital Stay: Payer: Medicaid Other | Admitting: Dietician

## 2023-06-06 ENCOUNTER — Inpatient Hospital Stay: Payer: Medicaid Other

## 2023-06-07 ENCOUNTER — Inpatient Hospital Stay: Payer: Medicaid Other
